# Patient Record
Sex: Female | Born: 1955 | Race: White | Hispanic: No | Smoking: Former smoker
Health system: Southern US, Community
[De-identification: ages and names within clinical notes are randomized; demographics above are authoritative.]

## PROBLEM LIST (undated history)

## (undated) DIAGNOSIS — E079 Disorder of thyroid, unspecified: Secondary | ICD-10-CM

## (undated) DIAGNOSIS — I509 Heart failure, unspecified: Secondary | ICD-10-CM

## (undated) DIAGNOSIS — F32A Depression, unspecified: Secondary | ICD-10-CM

## (undated) DIAGNOSIS — G8929 Other chronic pain: Secondary | ICD-10-CM

## (undated) DIAGNOSIS — F419 Anxiety disorder, unspecified: Secondary | ICD-10-CM

## (undated) DIAGNOSIS — Z9981 Dependence on supplemental oxygen: Secondary | ICD-10-CM

## (undated) DIAGNOSIS — J449 Chronic obstructive pulmonary disease, unspecified: Secondary | ICD-10-CM

## (undated) DIAGNOSIS — R053 Chronic cough: Secondary | ICD-10-CM

## (undated) DIAGNOSIS — F329 Major depressive disorder, single episode, unspecified: Secondary | ICD-10-CM

## (undated) DIAGNOSIS — E785 Hyperlipidemia, unspecified: Secondary | ICD-10-CM

## (undated) DIAGNOSIS — E119 Type 2 diabetes mellitus without complications: Secondary | ICD-10-CM

## (undated) DIAGNOSIS — R600 Localized edema: Secondary | ICD-10-CM

## (undated) DIAGNOSIS — R05 Cough: Secondary | ICD-10-CM

## (undated) DIAGNOSIS — K649 Unspecified hemorrhoids: Secondary | ICD-10-CM

## (undated) DIAGNOSIS — I10 Essential (primary) hypertension: Secondary | ICD-10-CM

## (undated) DIAGNOSIS — G629 Polyneuropathy, unspecified: Secondary | ICD-10-CM

## (undated) DIAGNOSIS — R609 Edema, unspecified: Secondary | ICD-10-CM

## (undated) HISTORY — PX: CHOLECYSTECTOMY: SHX55

---

## 2000-09-23 ENCOUNTER — Emergency Department (HOSPITAL_COMMUNITY): Admission: EM | Admit: 2000-09-23 | Discharge: 2000-09-23 | Payer: Self-pay | Admitting: *Deleted

## 2010-09-27 ENCOUNTER — Emergency Department (HOSPITAL_COMMUNITY)
Admission: EM | Admit: 2010-09-27 | Discharge: 2010-09-27 | Disposition: A | Discharge status: Home / Self Care | Payer: Medicaid Other | Attending: Emergency Medicine | Admitting: Emergency Medicine

## 2010-09-27 DIAGNOSIS — E7800 Pure hypercholesterolemia, unspecified: Secondary | ICD-10-CM | POA: Insufficient documentation

## 2010-09-27 DIAGNOSIS — R5381 Other malaise: Secondary | ICD-10-CM | POA: Insufficient documentation

## 2010-09-27 DIAGNOSIS — I10 Essential (primary) hypertension: Secondary | ICD-10-CM | POA: Insufficient documentation

## 2010-09-27 DIAGNOSIS — M79609 Pain in unspecified limb: Secondary | ICD-10-CM | POA: Insufficient documentation

## 2010-09-27 DIAGNOSIS — E039 Hypothyroidism, unspecified: Secondary | ICD-10-CM | POA: Insufficient documentation

## 2010-09-27 DIAGNOSIS — F411 Generalized anxiety disorder: Secondary | ICD-10-CM | POA: Insufficient documentation

## 2010-09-27 DIAGNOSIS — R509 Fever, unspecified: Secondary | ICD-10-CM | POA: Insufficient documentation

## 2010-09-27 DIAGNOSIS — A779 Spotted fever, unspecified: Secondary | ICD-10-CM | POA: Insufficient documentation

## 2010-09-27 DIAGNOSIS — Z79899 Other long term (current) drug therapy: Secondary | ICD-10-CM | POA: Insufficient documentation

## 2010-09-27 DIAGNOSIS — E119 Type 2 diabetes mellitus without complications: Secondary | ICD-10-CM | POA: Insufficient documentation

## 2013-01-03 ENCOUNTER — Encounter (HOSPITAL_COMMUNITY): Payer: Self-pay

## 2013-01-03 ENCOUNTER — Inpatient Hospital Stay (HOSPITAL_COMMUNITY)
Admission: EM | Admit: 2013-01-03 | Discharge: 2013-01-05 | DRG: 292 | Disposition: A | Discharge status: Home Health | Payer: Medicaid Other | Attending: Internal Medicine | Admitting: Internal Medicine

## 2013-01-03 ENCOUNTER — Emergency Department (HOSPITAL_COMMUNITY): Payer: Medicaid Other

## 2013-01-03 DIAGNOSIS — R600 Localized edema: Secondary | ICD-10-CM | POA: Diagnosis present

## 2013-01-03 DIAGNOSIS — I5031 Acute diastolic (congestive) heart failure: Principal | ICD-10-CM | POA: Diagnosis present

## 2013-01-03 DIAGNOSIS — F17201 Nicotine dependence, unspecified, in remission: Secondary | ICD-10-CM

## 2013-01-03 DIAGNOSIS — E785 Hyperlipidemia, unspecified: Secondary | ICD-10-CM | POA: Diagnosis present

## 2013-01-03 DIAGNOSIS — F341 Dysthymic disorder: Secondary | ICD-10-CM | POA: Diagnosis present

## 2013-01-03 DIAGNOSIS — R05 Cough: Secondary | ICD-10-CM | POA: Diagnosis present

## 2013-01-03 DIAGNOSIS — R0601 Orthopnea: Secondary | ICD-10-CM | POA: Diagnosis present

## 2013-01-03 DIAGNOSIS — K644 Residual hemorrhoidal skin tags: Secondary | ICD-10-CM | POA: Diagnosis present

## 2013-01-03 DIAGNOSIS — E669 Obesity, unspecified: Secondary | ICD-10-CM | POA: Diagnosis present

## 2013-01-03 DIAGNOSIS — J4489 Other specified chronic obstructive pulmonary disease: Secondary | ICD-10-CM | POA: Diagnosis present

## 2013-01-03 DIAGNOSIS — R7402 Elevation of levels of lactic acid dehydrogenase (LDH): Secondary | ICD-10-CM | POA: Diagnosis present

## 2013-01-03 DIAGNOSIS — K5900 Constipation, unspecified: Secondary | ICD-10-CM | POA: Diagnosis present

## 2013-01-03 DIAGNOSIS — IMO0001 Reserved for inherently not codable concepts without codable children: Secondary | ICD-10-CM | POA: Diagnosis present

## 2013-01-03 DIAGNOSIS — I509 Heart failure, unspecified: Secondary | ICD-10-CM | POA: Diagnosis present

## 2013-01-03 DIAGNOSIS — G589 Mononeuropathy, unspecified: Secondary | ICD-10-CM | POA: Diagnosis present

## 2013-01-03 DIAGNOSIS — G4733 Obstructive sleep apnea (adult) (pediatric): Secondary | ICD-10-CM | POA: Diagnosis present

## 2013-01-03 DIAGNOSIS — E8881 Metabolic syndrome: Secondary | ICD-10-CM | POA: Diagnosis present

## 2013-01-03 DIAGNOSIS — R053 Chronic cough: Secondary | ICD-10-CM | POA: Diagnosis present

## 2013-01-03 DIAGNOSIS — D649 Anemia, unspecified: Secondary | ICD-10-CM | POA: Diagnosis present

## 2013-01-03 DIAGNOSIS — Z794 Long term (current) use of insulin: Secondary | ICD-10-CM

## 2013-01-03 DIAGNOSIS — E662 Morbid (severe) obesity with alveolar hypoventilation: Secondary | ICD-10-CM | POA: Diagnosis present

## 2013-01-03 DIAGNOSIS — E039 Hypothyroidism, unspecified: Secondary | ICD-10-CM | POA: Diagnosis present

## 2013-01-03 DIAGNOSIS — R0600 Dyspnea, unspecified: Secondary | ICD-10-CM | POA: Diagnosis present

## 2013-01-03 DIAGNOSIS — J449 Chronic obstructive pulmonary disease, unspecified: Secondary | ICD-10-CM | POA: Diagnosis present

## 2013-01-03 DIAGNOSIS — R748 Abnormal levels of other serum enzymes: Secondary | ICD-10-CM | POA: Diagnosis present

## 2013-01-03 DIAGNOSIS — Z23 Encounter for immunization: Secondary | ICD-10-CM

## 2013-01-03 DIAGNOSIS — R7401 Elevation of levels of liver transaminase levels: Secondary | ICD-10-CM | POA: Diagnosis present

## 2013-01-03 DIAGNOSIS — Z87891 Personal history of nicotine dependence: Secondary | ICD-10-CM

## 2013-01-03 DIAGNOSIS — F418 Other specified anxiety disorders: Secondary | ICD-10-CM | POA: Diagnosis present

## 2013-01-03 DIAGNOSIS — Z6841 Body Mass Index (BMI) 40.0 and over, adult: Secondary | ICD-10-CM

## 2013-01-03 DIAGNOSIS — I87309 Chronic venous hypertension (idiopathic) without complications of unspecified lower extremity: Secondary | ICD-10-CM | POA: Diagnosis present

## 2013-01-03 HISTORY — DX: Other chronic pain: G89.29

## 2013-01-03 HISTORY — DX: Essential (primary) hypertension: I10

## 2013-01-03 HISTORY — DX: Polyneuropathy, unspecified: G62.9

## 2013-01-03 HISTORY — DX: Type 2 diabetes mellitus without complications: E11.9

## 2013-01-03 HISTORY — DX: Depression, unspecified: F32.A

## 2013-01-03 HISTORY — DX: Chronic obstructive pulmonary disease, unspecified: J44.9

## 2013-01-03 HISTORY — DX: Hyperlipidemia, unspecified: E78.5

## 2013-01-03 HISTORY — DX: Major depressive disorder, single episode, unspecified: F32.9

## 2013-01-03 HISTORY — DX: Anxiety disorder, unspecified: F41.9

## 2013-01-03 HISTORY — DX: Disorder of thyroid, unspecified: E07.9

## 2013-01-03 LAB — HEPATIC FUNCTION PANEL
AST: 36 U/L (ref 0–37)
Albumin: 3.2 g/dL — ABNORMAL LOW (ref 3.5–5.2)
Total Protein: 7 g/dL (ref 6.0–8.3)

## 2013-01-03 LAB — CBC WITH DIFFERENTIAL/PLATELET
Basophils Absolute: 0 10*3/uL (ref 0.0–0.1)
Eosinophils Relative: 3 % (ref 0–5)
HCT: 32.1 % — ABNORMAL LOW (ref 36.0–46.0)
Lymphocytes Relative: 13 % (ref 12–46)
MCV: 98.2 fL (ref 78.0–100.0)
Monocytes Relative: 4 % (ref 3–12)
Neutro Abs: 5.5 10*3/uL (ref 1.7–7.7)
Platelets: 116 10*3/uL — ABNORMAL LOW (ref 150–400)
RBC: 3.27 MIL/uL — ABNORMAL LOW (ref 3.87–5.11)
RDW: 15.8 % — ABNORMAL HIGH (ref 11.5–15.5)
WBC: 6.9 10*3/uL (ref 4.0–10.5)

## 2013-01-03 LAB — BASIC METABOLIC PANEL
CO2: 30 mEq/L (ref 19–32)
Chloride: 102 mEq/L (ref 96–112)
GFR calc Af Amer: >90 mL/min (ref >90)
Potassium: 3.6 mEq/L (ref 3.5–5.1)

## 2013-01-03 LAB — PRO B NATRIURETIC PEPTIDE: Pro B Natriuretic peptide (BNP): 306.3 pg/mL — ABNORMAL HIGH (ref 0–125)

## 2013-01-03 LAB — TROPONIN I: Troponin I: <0.3 ng/mL (ref <0.30)

## 2013-01-03 LAB — RETICULOCYTES
RBC.: 3.28 MIL/uL — ABNORMAL LOW (ref 3.87–5.11)
Retic Count, Absolute: 141 10*3/uL (ref 19.0–186.0)

## 2013-01-03 MED ORDER — ASPIRIN EC 81 MG PO TBEC
81.0000 mg | DELAYED_RELEASE_TABLET | Freq: Every day | ORAL | Status: DC
  Administered 2013-01-04 – 2013-01-05 (×2): 81 mg via ORAL
  Filled 2013-01-03 (×2): qty 1

## 2013-01-03 MED ORDER — INSULIN ASPART 100 UNIT/ML ~~LOC~~ SOLN
0.0000 [IU] | Freq: Three times a day (TID) | SUBCUTANEOUS | Status: DC
  Administered 2013-01-04: 3 [IU] via SUBCUTANEOUS

## 2013-01-03 MED ORDER — ALBUTEROL SULFATE (5 MG/ML) 0.5% IN NEBU
2.5000 mg | INHALATION_SOLUTION | RESPIRATORY_TRACT | Status: DC | PRN
  Administered 2013-01-04 (×2): 2.5 mg via RESPIRATORY_TRACT
  Filled 2013-01-03 (×2): qty 0.5

## 2013-01-03 MED ORDER — ENOXAPARIN SODIUM 40 MG/0.4ML ~~LOC~~ SOLN
40.0000 mg | SUBCUTANEOUS | Status: DC
  Administered 2013-01-04 – 2013-01-05 (×2): 40 mg via SUBCUTANEOUS
  Filled 2013-01-03 (×2): qty 0.4

## 2013-01-03 MED ORDER — GABAPENTIN 300 MG PO CAPS
300.0000 mg | ORAL_CAPSULE | Freq: Three times a day (TID) | ORAL | Status: DC
  Administered 2013-01-04 – 2013-01-05 (×5): 300 mg via ORAL
  Filled 2013-01-03 (×5): qty 1

## 2013-01-03 MED ORDER — LISINOPRIL 5 MG PO TABS
5.0000 mg | ORAL_TABLET | Freq: Every day | ORAL | Status: DC
  Administered 2013-01-04 – 2013-01-05 (×2): 5 mg via ORAL
  Filled 2013-01-03 (×2): qty 1

## 2013-01-03 MED ORDER — HYDROCODONE-ACETAMINOPHEN 5-325 MG PO TABS: 1.0000 | ORAL_TABLET | ORAL | Status: DC | PRN

## 2013-01-03 MED ORDER — FUROSEMIDE 10 MG/ML IJ SOLN
40.0000 mg | Freq: Two times a day (BID) | INTRAMUSCULAR | Status: DC
  Administered 2013-01-04 – 2013-01-05 (×3): 40 mg via INTRAVENOUS
  Filled 2013-01-03 (×4): qty 4

## 2013-01-03 MED ORDER — LEVOTHYROXINE SODIUM 100 MCG PO TABS
200.0000 ug | ORAL_TABLET | Freq: Every day | ORAL | Status: DC
  Administered 2013-01-04 – 2013-01-05 (×2): 200 ug via ORAL
  Filled 2013-01-03 (×2): qty 2

## 2013-01-03 MED ORDER — IPRATROPIUM BROMIDE 0.02 % IN SOLN
0.5000 mg | Freq: Once | RESPIRATORY_TRACT | Status: AC
  Administered 2013-01-03: 0.5 mg via RESPIRATORY_TRACT
  Filled 2013-01-03: qty 2.5

## 2013-01-03 MED ORDER — PREDNISONE 50 MG PO TABS
60.0000 mg | ORAL_TABLET | Freq: Once | ORAL | Status: AC
  Administered 2013-01-03: 60 mg via ORAL
  Filled 2013-01-03: qty 1

## 2013-01-03 MED ORDER — SODIUM CHLORIDE 0.9 % IJ SOLN: 3.0000 mL | INTRAMUSCULAR | Status: DC | PRN

## 2013-01-03 MED ORDER — FUROSEMIDE 10 MG/ML IJ SOLN
40.0000 mg | Freq: Once | INTRAMUSCULAR | Status: AC
  Administered 2013-01-03: 40 mg via INTRAVENOUS
  Filled 2013-01-03: qty 4

## 2013-01-03 MED ORDER — ONDANSETRON HCL 4 MG/2ML IJ SOLN: 4.0000 mg | Freq: Four times a day (QID) | INTRAMUSCULAR | Status: DC | PRN

## 2013-01-03 MED ORDER — SENNOSIDES-DOCUSATE SODIUM 8.6-50 MG PO TABS
2.0000 | ORAL_TABLET | Freq: Every day | ORAL | Status: DC
  Filled 2013-01-03 (×2): qty 2

## 2013-01-03 MED ORDER — IPRATROPIUM BROMIDE 0.02 % IN SOLN
0.5000 mg | RESPIRATORY_TRACT | Status: DC | PRN
  Administered 2013-01-04: 0.5 mg via RESPIRATORY_TRACT
  Filled 2013-01-03: qty 2.5

## 2013-01-03 MED ORDER — ACETAMINOPHEN 325 MG PO TABS: 650.0000 mg | ORAL_TABLET | ORAL | Status: DC | PRN

## 2013-01-03 MED ORDER — ALPRAZOLAM 0.25 MG PO TABS
0.2500 mg | ORAL_TABLET | Freq: Two times a day (BID) | ORAL | Status: DC | PRN
  Administered 2013-01-04: 0.25 mg via ORAL
  Filled 2013-01-03: qty 1

## 2013-01-03 MED ORDER — DULOXETINE HCL 20 MG PO CPEP
20.0000 mg | ORAL_CAPSULE | Freq: Every day | ORAL | Status: DC
  Administered 2013-01-04 – 2013-01-05 (×2): 20 mg via ORAL
  Filled 2013-01-03 (×3): qty 1

## 2013-01-03 MED ORDER — ALPRAZOLAM 0.5 MG PO TABS
0.5000 mg | ORAL_TABLET | Freq: Every day | ORAL | Status: DC
  Administered 2013-01-04 (×2): 0.5 mg via ORAL
  Filled 2013-01-03 (×2): qty 1

## 2013-01-03 MED ORDER — SODIUM CHLORIDE 0.9 % IJ SOLN
3.0000 mL | Freq: Two times a day (BID) | INTRAMUSCULAR | Status: DC
  Administered 2013-01-04 – 2013-01-05 (×4): 3 mL via INTRAVENOUS

## 2013-01-03 MED ORDER — ALBUTEROL SULFATE (5 MG/ML) 0.5% IN NEBU
5.0000 mg | INHALATION_SOLUTION | Freq: Once | RESPIRATORY_TRACT | Status: AC
  Administered 2013-01-03: 5 mg via RESPIRATORY_TRACT
  Filled 2013-01-03: qty 1

## 2013-01-03 MED ORDER — INSULIN GLARGINE 100 UNIT/ML ~~LOC~~ SOLN
10.0000 [IU] | Freq: Every day | SUBCUTANEOUS | Status: DC
  Administered 2013-01-04 (×2): 10 [IU] via SUBCUTANEOUS
  Filled 2013-01-03 (×3): qty 0.1

## 2013-01-03 MED ORDER — SODIUM CHLORIDE 0.9 % IV SOLN: 250.0000 mL | INTRAVENOUS | Status: DC | PRN

## 2013-01-03 MED ORDER — POTASSIUM CHLORIDE CRYS ER 20 MEQ PO TBCR
20.0000 meq | EXTENDED_RELEASE_TABLET | Freq: Every day | ORAL | Status: DC
  Administered 2013-01-04 – 2013-01-05 (×3): 20 meq via ORAL
  Filled 2013-01-03 (×3): qty 1

## 2013-01-03 NOTE — ED Provider Notes (Signed)
History    CSN: [id number] Arrival date & time 01/03/13  1353  First MD Initiated Contact with Patient 01/03/13 1408     Chief Complaint  Patient presents with  . Shortness of Breath  . Cough    HPI Pt was seen at 1415.   Per pt, c/o gradual onset and worsening of persistent cough, wheezing and SOB for the past 2 weeks, worse over the past several days.  Describes her symptoms as "someone told me it was COPD." States she ran out of her MDI, but has been using her home nebs with transient relief. Has been associated with increasing pedal edema. States she has been unable to walk her usual distances due to increasing SOB. Denies hx CHF. Denies CP/palpitations, no back pain, no abd pain, no N/V/D, no fevers, no rash.    No PMD Past Medical History  Diagnosis Date  . COPD (chronic obstructive pulmonary disease)   . Diabetes mellitus without complication   . Hypertension   . Thyroid disease   . Hyperlipemia   . Neuropathy   . Depression   . Chronic pain   . Anxiety    History reviewed. No pertinent past surgical history.  History  Substance Use Topics  . Smoking status: Former Games developer  . Smokeless tobacco: Not on file  . Alcohol Use: No    Review of Systems ROS: Statement: All systems negative except as marked or noted in the HPI; Constitutional: Negative for fever and chills. ; ; Eyes: Negative for eye pain, redness and discharge. ; ; ENMT: Negative for ear pain, hoarseness, nasal congestion, sinus pressure and sore throat. ; ; Cardiovascular: Negative for chest pain, palpitations, diaphoresis, +dyspnea and peripheral edema. ; ; Respiratory: +cough, wheezing. Negative for stridor. ; ; Gastrointestinal: Negative for nausea, vomiting, diarrhea, abdominal pain, blood in stool, hematemesis, jaundice and rectal bleeding. . ; ; Genitourinary: Negative for dysuria, flank pain and hematuria. ; ; Musculoskeletal: Negative for back pain and neck pain. Negative for swelling and trauma.; ;  Skin: Negative for pruritus, rash, abrasions, blisters, bruising and skin lesion.; ; Neuro: Negative for headache, lightheadedness and neck stiffness. Negative for weakness, altered level of consciousness , altered mental status, extremity weakness, paresthesias, involuntary movement, seizure and syncope.       Allergies  Codeine; Keflex; and Penicillins  Home Medications   Current Outpatient Rx  Name  Route  Sig  Dispense  Refill  . gabapentin (NEURONTIN) 300 MG capsule   Oral   Take 300 mg by mouth 3 (three) times daily.         . insulin glargine (LANTUS) 100 UNIT/ML injection   Subcutaneous   Inject 10 Units into the skin at bedtime.         . insulin lispro (HUMALOG) 100 UNIT/ML injection   Subcutaneous   Inject 10 Units into the skin 3 (three) times daily before meals.         Marland Kitchen levothyroxine (SYNTHROID, LEVOTHROID) 200 MCG tablet   Oral   Take 200 mcg by mouth daily before breakfast.         . PARoxetine (PAXIL) 20 MG tablet   Oral   Take 20 mg by mouth every morning.          BP 126/72  Pulse 104  Temp(Src) 98.2 F (36.8 C) (Oral)  Resp 24  SpO2 99% Physical Exam 1420: Physical examination:  Nursing notes reviewed; Vital signs and O2 SAT reviewed;  Constitutional: Well developed, Well nourished,  Well hydrated, In no acute distress; Head:  Normocephalic, atraumatic; Eyes: EOMI, PERRL, No scleral icterus; ENMT: TM's clear bilat. Mouth and pharynx normal, Mucous membranes moist; Neck: Supple, Full range of motion, No lymphadenopathy; Cardiovascular: Regular rate and rhythm, No gallop; Respiratory: Breath sounds coarse & equal bilaterally, diminished at bases bilat with scattered wheezing. Speaking full sentences, Normal respiratory effort/excursion; Chest: Nontender, Movement normal; Abdomen: Soft, Morbidly obese. Nontender, Normal bowel sounds; Genitourinary: No CVA tenderness; Extremities: Pulses normal, No tenderness, +2 pedal edema bilat. No calf  asymmetry.; Neuro: AA&Ox3, Major CN grossly intact.  Speech clear. No gross focal motor or sensory deficits in extremities.; Skin: Color normal, Warm, Dry.   ED Course  Procedures    MDM  MDM Reviewed: previous chart, nursing note and vitals Interpretation: labs, ECG and x-ray    Date: 01/03/2013  Rate: 104  Rhythm: sinus tachycardia, artifact  QRS Axis: normal  Intervals: normal  ST/T Wave abnormalities: nonspecific ST/T changes  Conduction Disutrbances:none  Narrative Interpretation:   Old EKG Reviewed: none available.   Results for orders placed during the hospital encounter of 01/03/13  CBC WITH DIFFERENTIAL      Result Value Range   WBC 6.9  4.0 - 10.5 K/uL   RBC 3.27 (*) 3.87 - 5.11 MIL/uL   Hemoglobin 10.5 (*) 12.0 - 15.0 g/dL   HCT 16.1 (*) 09.6 - 04.5 %   MCV 98.2  78.0 - 100.0 fL   MCH 32.1  26.0 - 34.0 pg   MCHC 32.7  30.0 - 36.0 g/dL   RDW 40.9 (*) 81.1 - 91.4 %   Platelets 116 (*) 150 - 400 K/uL   Neutrophils Relative % 80 (*) 43 - 77 %   Lymphocytes Relative 13  12 - 46 %   Monocytes Relative 4  3 - 12 %   Eosinophils Relative 3  0 - 5 %   Basophils Relative 0  0 - 1 %   Neutro Abs 5.5  1.7 - 7.7 K/uL   Lymphs Abs 0.9  0.7 - 4.0 K/uL   Monocytes Absolute 0.3  0.1 - 1.0 K/uL   Eosinophils Absolute 0.2  0.0 - 0.7 K/uL   Basophils Absolute 0.0  0.0 - 0.1 K/uL   Smear Review PLATELET COUNT CONFIRMED BY SMEAR    BASIC METABOLIC PANEL      Result Value Range   Sodium 139  135 - 145 mEq/L   Potassium 3.6  3.5 - 5.1 mEq/L   Chloride 102  96 - 112 mEq/L   CO2 30  19 - 32 mEq/L   Glucose, Bld 152 (*) 70 - 99 mg/dL   BUN 11  6 - 23 mg/dL   Creatinine, Ser 7.82  0.50 - 1.10 mg/dL   Calcium 8.9  8.4 - 95.6 mg/dL   GFR calc non Af Amer 80 (*) >90 mL/min   GFR calc Af Amer >90  >90 mL/min  TROPONIN I      Result Value Range   Troponin I <0.30  <0.30 ng/mL  PRO B NATRIURETIC PEPTIDE      Result Value Range   Pro B Natriuretic peptide (BNP) 306.3 (*) 0 -  125 pg/mL   Dg Chest 2 View 01/03/2013   *RADIOLOGY REPORT*  Clinical Data: Shortness of breath and cough  CHEST - 2 VIEW  Comparison: Report from chest radiograph 10/01/2009.  Images not available to  Findings: Mild cardiomegaly.  There is a central and peripheral pulmonary vascular congestion.  There  is diffuse interstitial prominence throughout both lungs with thickening of the minor fissure. Small airspace opacities versus atelectasis at the lung bases medially.  There are small bilateral pleural effusions.  The lateral view is slightly limited by respiratory motion.  No acute bony abnormality is identified.  IMPRESSION: Congestive heart failure pattern with small bilateral pleural effusions.   Original Report Authenticated By: Britta Mccreedy, M.D.    1945: Neb and prednisone given for wheezing on arrival. Pt ambulated with Sats 97% R/A afterwards. Stated she "felt better" but after returning to her exam room c/o increasing SOB and coughing. Lungs coarse bilat, diminished at bases bilat. Sats 99% R/A. CXR with CHF and pleural effusions, no hx of same.  H/H decreased; no old to compare. Will dose IV lasix. Dx and testing d/w pt and family.  Questions answered.  Verb understanding, agreeable to observation admit. T/C to [facility]. Rito Ehrlich, case discussed, including:  HPI, pertinent PM/SHx, VS/PE, dx testing, ED course and treatment:  Agreeable to observation admit, requests he would like to come to ED for eval.      Laray Anger, DO 01/05/13 0037

## 2013-01-03 NOTE — ED Notes (Signed)
Patient's O2 sat maintained at 97-98% while walking.  Patient stated she did not feel SOB while we were walking.  She stated she had not been able to walk more than 15-20 steps without becoming "short winded" but now she was able to walk without any respiratory distress.  Nurse and MD notified

## 2013-01-03 NOTE — ED Notes (Signed)
Patient states she feels she is breathing better now.

## 2013-01-03 NOTE — ED Notes (Signed)
Attempted to clean patient of bm she had done before i  Took over her assignment  But she refused and stated she would shower upon admission to floor

## 2013-01-03 NOTE — ED Notes (Signed)
Pt reports sob for the past 2 weeks.  Pt reports cough/congestion as well.  Pt reports hx of COPD.

## 2013-01-04 ENCOUNTER — Inpatient Hospital Stay (HOSPITAL_COMMUNITY): Payer: Medicaid Other

## 2013-01-04 DIAGNOSIS — IMO0001 Reserved for inherently not codable concepts without codable children: Secondary | ICD-10-CM

## 2013-01-04 DIAGNOSIS — R740 Nonspecific elevation of levels of transaminase and lactic acid dehydrogenase [LDH]: Secondary | ICD-10-CM

## 2013-01-04 DIAGNOSIS — I379 Nonrheumatic pulmonary valve disorder, unspecified: Secondary | ICD-10-CM

## 2013-01-04 DIAGNOSIS — R0609 Other forms of dyspnea: Secondary | ICD-10-CM

## 2013-01-04 DIAGNOSIS — E8881 Metabolic syndrome: Secondary | ICD-10-CM

## 2013-01-04 DIAGNOSIS — I509 Heart failure, unspecified: Secondary | ICD-10-CM

## 2013-01-04 DIAGNOSIS — R05 Cough: Secondary | ICD-10-CM

## 2013-01-04 DIAGNOSIS — E669 Obesity, unspecified: Secondary | ICD-10-CM

## 2013-01-04 DIAGNOSIS — F341 Dysthymic disorder: Secondary | ICD-10-CM

## 2013-01-04 DIAGNOSIS — D649 Anemia, unspecified: Secondary | ICD-10-CM

## 2013-01-04 DIAGNOSIS — R7401 Elevation of levels of liver transaminase levels: Secondary | ICD-10-CM | POA: Diagnosis present

## 2013-01-04 LAB — URINE MICROSCOPIC-ADD ON

## 2013-01-04 LAB — LIPID PANEL
HDL: 56 mg/dL (ref >39)
LDL Cholesterol: 33 mg/dL (ref 0–99)
Total CHOL/HDL Ratio: 1.8 RATIO
Triglycerides: 58 mg/dL (ref <150)
VLDL: 12 mg/dL (ref 0–40)

## 2013-01-04 LAB — GLUCOSE, CAPILLARY
Glucose-Capillary: 102 mg/dL — ABNORMAL HIGH (ref 70–99)
Glucose-Capillary: 121 mg/dL — ABNORMAL HIGH (ref 70–99)
Glucose-Capillary: 125 mg/dL — ABNORMAL HIGH (ref 70–99)
Glucose-Capillary: 86 mg/dL (ref 70–99)
Glucose-Capillary: 97 mg/dL (ref 70–99)

## 2013-01-04 LAB — CBC
HCT: 30 % — ABNORMAL LOW (ref 36.0–46.0)
Hemoglobin: 10 g/dL — ABNORMAL LOW (ref 12.0–15.0)
MCH: 32.6 pg (ref 26.0–34.0)
MCHC: 33.3 g/dL (ref 30.0–36.0)
MCV: 97.7 fL (ref 78.0–100.0)
Platelets: 123 K/uL — ABNORMAL LOW (ref 150–400)
RBC: 3.07 MIL/uL — ABNORMAL LOW (ref 3.87–5.11)
RDW: 15.6 % — ABNORMAL HIGH (ref 11.5–15.5)
WBC: 8.5 K/uL (ref 4.0–10.5)

## 2013-01-04 LAB — URINALYSIS, ROUTINE W REFLEX MICROSCOPIC
Bilirubin Urine: NEGATIVE
Glucose, UA: NEGATIVE mg/dL
Ketones, ur: NEGATIVE mg/dL
Nitrite: NEGATIVE
Protein, ur: NEGATIVE mg/dL
Specific Gravity, Urine: 1.025 (ref 1.005–1.030)
Urobilinogen, UA: 0.2 mg/dL (ref 0.0–1.0)
pH: 5.5 (ref 5.0–8.0)

## 2013-01-04 LAB — TROPONIN I
Troponin I: <0.3 ng/mL
Troponin I: <0.3 ng/mL

## 2013-01-04 LAB — IRON AND TIBC
Iron: 50 ug/dL (ref 42–135)
Saturation Ratios: 18 % — ABNORMAL LOW (ref 20–55)
TIBC: 276 ug/dL (ref 250–470)
UIBC: 226 ug/dL (ref 125–400)

## 2013-01-04 LAB — BASIC METABOLIC PANEL WITH GFR
BUN: 13 mg/dL (ref 6–23)
CO2: 31 meq/L (ref 19–32)
Calcium: 8.6 mg/dL (ref 8.4–10.5)
Chloride: 105 meq/L (ref 96–112)
Creatinine, Ser: 0.91 mg/dL (ref 0.50–1.10)
GFR calc Af Amer: 81 mL/min — ABNORMAL LOW
GFR calc non Af Amer: 70 mL/min — ABNORMAL LOW
Glucose, Bld: 135 mg/dL — ABNORMAL HIGH (ref 70–99)
Potassium: 3.7 meq/L (ref 3.5–5.1)
Sodium: 141 meq/L (ref 135–145)

## 2013-01-04 LAB — TSH: TSH: 1.241 u[IU]/mL (ref 0.350–4.500)

## 2013-01-04 LAB — FOLATE: Folate: 9.1 ng/mL

## 2013-01-04 LAB — VITAMIN B12: Vitamin B-12: 664 pg/mL (ref 211–911)

## 2013-01-04 MED ORDER — PNEUMOCOCCAL VAC POLYVALENT 25 MCG/0.5ML IJ INJ
0.5000 mL | INJECTION | INTRAMUSCULAR | Status: AC
  Administered 2013-01-05: 0.5 mL via INTRAMUSCULAR
  Filled 2013-01-04: qty 0.5

## 2013-01-04 MED ORDER — INSULIN GLARGINE 100 UNIT/ML ~~LOC~~ SOLN
SUBCUTANEOUS | Status: AC
  Filled 2013-01-04: qty 10

## 2013-01-04 NOTE — Progress Notes (Signed)
[facility] PROGRESS NOTE  [name] Green ZOX:[id number] DOB: 1955/10/15 DOA: 01/03/2013 PCP: No PCP Per Patient  Assessment/Plan: 1. Acute CHF (congestive heart failure): Underlying etiology unknown, this is her first presentation with signs and symptoms of congestive heart failure which included an elevated BNP and evidence of pulmonary edema on her chest x-ray. Clinically improved this am.  Will continue Lasix IV 40 mg every 12 with potassium repletion.  Await 2-D echo results. Repeat EKG this am yields occasional PVC's not present yesterday. Cardiac enzymes negative to date. TSH in process. Continue ACE inhibitor. No beta blocker in setting of acute decompensation and mild hypotension. Continue  Aspirin 81 mg  2. Obesity, unspecified w/ Metabolic syndrome: BMI 88.9. Provided education on obesity and nutritional needs   3. Type II or unspecified type diabetes mellitus without mention of complication, uncontrolled: Fair control.  A1c pending. Continue SSI   4. Other and unspecified hyperlipidemia: Fasting Lipid Panel within the limits of normal.    5. Dyspnea: multifactorial-probable OHS/OSA, also has anemia. Improved this am.   6. Tobacco abuse, in remission   7. Lower extremity edema: Chronic problem related to obesity and lymphedema as well as venous hypertension, additionally may be from heart failure. Decreased LE edema this am. Will continue lasix   8. Anemia: Stool guaiac and anemia panel pending, Unsure of baseline. Likely multifactorial. No s/sx active bleeding. Will monitor   9. Unspecified constipation: Scheduled senna and docusate   10. Hemorrhoids, external: Potential source of GI blood loss.   11. Chronic cough: Unclear etiology may be related to heart failure. Improved this am. Nebs prn  12. Unspecified hypothyroidism:  TSH in process  13. Depression with anxiety: Started on Cymbalta and alprazolam scheduled at bedtime and twice daily when necessary for panic and  anxiety. Stable at baseline  14. Elevated alk phos and bilirubin. Etiology uncertain. Will monitor.    Code Status: full Family Communication: pt at bedside Disposition Plan: home when ready hopefully 24-48 hours   Consultants:  none  Procedures:  none  Antibiotics:  none  HPI/Subjective: Awake alert. Reports "feeling a lot better and the swelling in my legs has gone down".  Objective: Filed Vitals:   01/04/13 0443 01/04/13 0627 01/04/13 0736 01/04/13 0802  BP:  132/84    Pulse:  98  100  Temp:  97.9 F (36.6 C)    TempSrc:  Oral    Resp:  24  18  Height:  5\' 4"  (1.626 m)    Weight:  516 lb 11.2 oz (234.374 kg)    SpO2: 93% 94% 97% 98%   No intake or output data in the 24 hours ending 01/04/13 0846 Filed Weights   01/04/13 0627  Weight: 516 lb 11.2 oz (234.374 kg)    Exam:   General:  Morbidly obese, NAD  Cardiovascular: tachycardic but regular No MGR 1+LE edema  Respiratory: normal effort BS distant with fine crackles bilateral based up to lower thoracic. No wheeze or rhonchi  Abdomen: soft morbidly obese +BS non-tender to palpation  Musculoskeletal: no clubbing no cyanosis   Data Reviewed: Basic Metabolic Panel:  Recent Labs Lab 01/03/13 1727 01/04/13 0544  NA 139 141  K 3.6 3.7  CL 102 105  CO2 30 31  GLUCOSE 152* 135*  BUN 11 13  CREATININE 0.81 0.91  CALCIUM 8.9 8.6   Liver Function Tests:  Recent Labs Lab 01/03/13 2334  AST 36  ALT 15  ALKPHOS 132*  BILITOT 1.9*  PROT  7.0  ALBUMIN 3.2*   No results found for this basename: LIPASE, AMYLASE,  in the last 168 hours No results found for this basename: AMMONIA,  in the last 168 hours CBC:  Recent Labs Lab 01/03/13 1727 01/04/13 0544  WBC 6.9 8.5  NEUTROABS 5.5  --   HGB 10.5* 10.0*  HCT 32.1* 30.0*  MCV 98.2 97.7  PLT 116* 123*   Cardiac Enzymes:  Recent Labs Lab 01/03/13 1727 01/03/13 2350 01/04/13 0544  TROPONINI <0.30 <0.30 <0.30   BNP (last 3  results)  Recent Labs  01/03/13 1727  PROBNP 306.3*   CBG:  Recent Labs Lab 01/04/13 0011 01/04/13 0823  GLUCAP 125* 121*    No results found for this or any previous visit (from the past 240 hour(s)).   Studies: Dg Chest 2 View  01/03/2013   *RADIOLOGY REPORT*  Clinical Data: Shortness of breath and cough  CHEST - 2 VIEW  Comparison: Report from chest radiograph 10/01/2009.  Images not available to  Findings: Mild cardiomegaly.  There is a central and peripheral pulmonary vascular congestion.  There is diffuse interstitial prominence throughout both lungs with thickening of the minor fissure. Small airspace opacities versus atelectasis at the lung bases medially.  There are small bilateral pleural effusions.  The lateral view is slightly limited by respiratory motion.  No acute bony abnormality is identified.  IMPRESSION: Congestive heart failure pattern with small bilateral pleural effusions.   Original Report Authenticated By: Britta Mccreedy, M.D.    Scheduled Meds: . ALPRAZolam  0.5 mg Oral QHS  . aspirin EC  81 mg Oral Daily  . DULoxetine  20 mg Oral Daily  . enoxaparin (LOVENOX) injection  40 mg Subcutaneous Q24H  . furosemide  40 mg Intravenous BID  . gabapentin  300 mg Oral TID  . insulin aspart  0-20 Units Subcutaneous TID WC  . insulin glargine  10 Units Subcutaneous QHS  . levothyroxine  200 mcg Oral QAC breakfast  . lisinopril  5 mg Oral Daily  . [START ON 01/05/2013] pneumococcal 23 valent vaccine  0.5 mL Intramuscular Tomorrow-1000  . potassium chloride  20 mEq Oral Daily  . senna-docusate  2 tablet Oral QHS  . sodium chloride  3 mL Intravenous Q12H   Continuous Infusions:   Principal Problem:   Acute CHF (congestive heart failure) Active Problems:   Obesity, unspecified   Metabolic syndrome   Type II or unspecified type diabetes mellitus without mention of complication, uncontrolled   Other and unspecified hyperlipidemia   Dyspnea   Tobacco abuse, in  remission   Lower extremity edema   Orthopnea   Anemia   Unspecified constipation   Hemorrhoids, external   Chronic cough   Unspecified hypothyroidism   Depression with anxiety    Time spent: 30 minutes    [facility] M  [facility] Pager [phone]. If 7PM-7AM, please contact night-coverage at [email or web address], password [facility] 01/04/2013, 8:46 AM  LOS: 1 day  Attending: Patient seen and examined.Agree with above.Repeat CXR,await ECHO.

## 2013-01-04 NOTE — Progress Notes (Signed)
Pt refused foley catheter. [name] Green made aware.  Pt able to ambulate to BR with assistance.  Okay to leave out at this time per [name].

## 2013-01-04 NOTE — Progress Notes (Signed)
*  PRELIMINARY RESULTS* Echocardiogram 2D Echocardiogram has been performed.  [address] 01/04/2013, 11:53 AM

## 2013-01-04 NOTE — Care Management Note (Signed)
    Page 1 of 2   01/05/2013     3:14:13 PM   CARE MANAGEMENT NOTE 01/05/2013  Patient:  [name] Green, [name] Green   Account Number:  [id number]  Date Initiated:  01/04/2013  Documentation initiated by:  Sharrie Rothman  Subjective/Objective Assessment:   Pt admitted from home with CHF. Pt lives alone but has a grandson and granddaughter who stay with her at night. Pt does not have PCp. No DME or HH in the home at present. Pt is requesting equipment and HH at dischrge.     Action/Plan:   CM will arrange PCp with [facility]. Will evaluate need for home O2 prior to discharge. Will arrange HH and DMe as well.   Anticipated DC Date:  01/05/2013   Anticipated DC Plan:  HOME W HOME HEALTH SERVICES      DC Planning Services  CM consult      Choice offered to / List presented to:     DME arranged  [facility] B[facility]      DME agency  [facility]     [facility] arranged  HH-1 RN  HH-10 DISEASE MANAGEMENT      Status of service:  Completed, signed off Medicare Important Message given?   (If response is "NO", the following Medicare IM given date fields will be blank) Date Medicare IM given:   Date Additional Medicare IM given:    Discharge Disposition:  HOME W HOME HEALTH SERVICES  Per UR Regulation:    If discussed at Long Length of Stay Meetings, dates discussed:    Comments:  01/04/13 1410 Arlyss Queen, RN BSN CM

## 2013-01-04 NOTE — Progress Notes (Signed)
UR chart review completed.  

## 2013-01-04 NOTE — H&P (Signed)
[facility] History and Physical  [name] Green ZOX:[id number] DOB: [birth year] DOA: 01/03/2013  Referring physician: EDP McMannus PCP: No PCP Per Patient  Specialists: none  Chief Complaint: Dyspnea  HPI: [name] Green is a 57 y.o. female with metabolic syndrome, type 2 diabetes on insulin, hypertension and hypothyroidism who presented to the emergency department complaining of a two-week history of worsening shortness of breath. She has sporadic encounters with various physicians in the primary care setting but has never established care with a consistent  Provider. The patient states that she has had shortness of breath and exertion for many months but in the last 2 weeks it has become hard for her to walk just a few feet without significant difficulty. She is unable to lie flat and has been sleeping in a recliner for several days. She has worsening lower extremity edema. She denies any chest pain. No fevers chills or night sweats. She complains of a chronic cough which is productive of copious clear mucus worse in the morning. Denies any hematochezia but does report constipation with bleeding hemorrhoids. She has chronic lower extremity swelling and pain, no asymmetry. She reports issues with depression and anxiety and has a very sedentary lifestyle. She denies any known heart problems. She has a history of smoking but quit 5 years ago.   Review of Systems: Review of Systems  Constitutional: Positive for malaise/fatigue and diaphoresis. Negative for fever, chills and weight loss.  HENT: Positive for congestion.   Eyes: Negative for blurred vision.  Respiratory: Positive for cough, sputum production, shortness of breath and wheezing. Negative for hemoptysis.   Cardiovascular: Positive for orthopnea, leg swelling and PND. Negative for chest pain, palpitations and claudication.  Gastrointestinal: Positive for heartburn, nausea, constipation and blood in stool. Negative for vomiting,  abdominal pain, diarrhea and melena.  Genitourinary: Negative for dysuria, urgency and frequency.  Musculoskeletal: Positive for myalgias and joint pain.  Skin: Negative for itching and rash.  Neurological: Positive for dizziness, weakness and headaches. Negative for focal weakness.  Endo/Heme/Allergies: Does not bruise/bleed easily.  Psychiatric/Behavioral: Positive for depression. Negative for suicidal ideas, hallucinations, memory loss and substance abuse. The patient is nervous/anxious and has insomnia.   All other systems reviewed and are negative.     Past Medical History  Diagnosis Date  . COPD (chronic obstructive pulmonary disease)   . Diabetes mellitus without complication   . Hypertension   . Thyroid disease   . Hyperlipemia   . Neuropathy   . Depression   . Chronic pain   . Anxiety    History reviewed. No pertinent past surgical history. Social History:  reports that she has quit smoking. She does not have any smokeless tobacco history on file. She reports that she does not drink alcohol or use illicit drugs. Lives at home alone, she has sisters who help with her care. Mostly independent in her ADLs.  Allergies  Allergen Reactions  . Codeine Nausea And Vomiting  . Keflex (Cephalexin) Hives and Nausea And Vomiting  . Penicillins Nausea And Vomiting    Family history positive for lung cancer in her brother, hypertension in her mother and father, her mother is deceased of nonalcoholic cirrhosis liver failure. No known history of heart disease.  Prior to Admission medications   Medication Sig Start Date End Date Taking? Authorizing Provider  gabapentin (NEURONTIN) 300 MG capsule Take 300 mg by mouth 3 (three) times daily.   Yes Historical Provider, MD  insulin glargine (LANTUS) 100 UNIT/ML injection Inject  10 Units into the skin at bedtime.   Yes Historical Provider, MD  insulin lispro (HUMALOG) 100 UNIT/ML injection Inject 10 Units into the skin 3 (three) times daily  before meals.   Yes Historical Provider, MD  levothyroxine (SYNTHROID, LEVOTHROID) 200 MCG tablet Take 200 mcg by mouth daily before breakfast.   Yes Historical Provider, MD  PARoxetine (PAXIL) 20 MG tablet Take 20 mg by mouth every morning.   Yes Historical Provider, MD   Physical Exam: Filed Vitals:   01/03/13 1844 01/03/13 2157 01/03/13 2250 01/03/13 2334  BP: 126/72 124/66 110/57   Pulse: 104 108 95   Temp:      TempSrc:      Resp: 24 28 22    SpO2: 99% 96% 95% 95%     General:  Severely obese woman appearing older than stated age, guarded but cooperative with exam, no acute distress  Eyes: Normal  ENT: Dry poor dentition  Neck: Supple no adenopathy, positive for JVD  Cardiovascular: Tachycardic but regular no murmurs rubs or gallops  Respiratory: Crackles in bilateral bases good air flow  Abdomen: Obese, large pannus, no pain on palpation  Skin: No lesions, 1-2+ pitting edema in the lower extremities  Musculoskeletal: Moves all 4 extremities equally  Psychiatric: Anxious, depressed appearing  Neurologic: Nonfocal  Labs on Admission:  Basic Metabolic Panel:  Recent Labs Lab 01/03/13 1727  NA 139  K 3.6  CL 102  CO2 30  GLUCOSE 152*  BUN 11  CREATININE 0.81  CALCIUM 8.9   Liver Function Tests:  Recent Labs Lab 01/03/13 2334  AST 36  ALT 15  ALKPHOS 132*  BILITOT 1.9*  PROT 7.0  ALBUMIN 3.2*   CBC:  Recent Labs Lab 01/03/13 1727  WBC 6.9  NEUTROABS 5.5  HGB 10.5*  HCT 32.1*  MCV 98.2  PLT 116*   Cardiac Enzymes:  Recent Labs Lab 01/03/13 1727 01/03/13 2350  TROPONINI <0.30 <0.30    BNP (last 3 results)  Recent Labs  01/03/13 1727  PROBNP 306.3*   CBG:  Recent Labs Lab 01/04/13 0011  GLUCAP 125*    Radiological Exams on Admission: Dg Chest 2 View  01/03/2013   *RADIOLOGY REPORT*  Clinical Data: Shortness of breath and cough  CHEST - 2 VIEW  Comparison: Report from chest radiograph 10/01/2009.  Images not available  to  Findings: Mild cardiomegaly.  There is a central and peripheral pulmonary vascular congestion.  There is diffuse interstitial prominence throughout both lungs with thickening of the minor fissure. Small airspace opacities versus atelectasis at the lung bases medially.  There are small bilateral pleural effusions.  The lateral view is slightly limited by respiratory motion.  No acute bony abnormality is identified.  IMPRESSION: Congestive heart failure pattern with small bilateral pleural effusions.   Original Report Authenticated By: Britta Mccreedy, M.D.    EKG: Independently reviewed. Low voltage with sinus tachycardia  Assessment/Plan Principal Problem: 1. Acute CHF (congestive heart failure): Underlying etiology unknown, this is her first presentation with signs and symptoms of congestive heart failure which included an elevated BNP and evidence of pulmonary edema on her chest x-ray.  Lasix IV 40 mg every 6 with potassium repletion  Repeat chest x-ray in a.m.  Repeat ;abs in a.m.  2-D echo  12-lead EKG in a.m. and cycled troponins monitor on telemetry  TSH  Started on ACE inhibitor, did not start beta blocker in setting of acute decompensation and mild hypotension  Aspirin 81 mg  2. Obesity,  unspecified w/  Metabolic syndrome: Provided education on obesity and nutritional needs 3. Type II or unspecified type diabetes mellitus without mention of complication, uncontrolled: We'll check A1c and start on adult glycemic protocol   4. Other and unspecified hyperlipidemia: Fasting Lipid Panel, not currently on a statin  5. Dyspnea: multifactorial-probable OHS/OSA, also has anemia  6. Tobacco abuse, in remission  7. Lower extremity edema: Chronic problem related to obesity and lymphedema as well as venous hypertension, additionally may be from heart failure 8. Anemia: Stool guaiac and anemia panel ordered, CBC in AM- her anemia may also be significantly contributing to her shortness of  breath 9. Unspecified constipation: Scheduled senna and docusate 10. Hemorrhoids, external: Potential source of GI blood loss 11. Chronic cough: Unclear etiology may be related to heart failure  12. Unspecified hypothyroidism: Status unknown- check a TSH 13. Depression with anxiety: Started on Cymbalta and alprazolam scheduled at bedtime and twice daily when necessary for panic and anxiety.   Code Status: Full code Family Communication: Discussed plan of care in detail with patient and her family present in the room Disposition Plan: Discharge home in medically stable, anticipate one to 2 days Time spent: 70 minutes  [facility] Pager [phone]  If 7PM-7AM, please contact night-coverage [email or web address] Password [facility] 01/04/2013, 12:50 AM

## 2013-01-05 LAB — BASIC METABOLIC PANEL
Calcium: 8.4 mg/dL (ref 8.4–10.5)
GFR calc non Af Amer: 59 mL/min — ABNORMAL LOW (ref >90)
Potassium: 3.3 mEq/L — ABNORMAL LOW (ref 3.5–5.1)
Sodium: 143 mEq/L (ref 135–145)

## 2013-01-05 LAB — CBC
Platelets: 112 10*3/uL — ABNORMAL LOW (ref 150–400)
RBC: 2.92 MIL/uL — ABNORMAL LOW (ref 3.87–5.11)
WBC: 7.1 10*3/uL (ref 4.0–10.5)

## 2013-01-05 LAB — GLUCOSE, CAPILLARY: Glucose-Capillary: 87 mg/dL (ref 70–99)

## 2013-01-05 MED ORDER — ENOXAPARIN SODIUM 120 MG/0.8ML ~~LOC~~ SOLN
120.0000 mg | SUBCUTANEOUS | Status: DC
  Filled 2013-01-05: qty 0.8

## 2013-01-05 MED ORDER — ALPRAZOLAM 0.25 MG PO TABS: 0.2500 mg | ORAL_TABLET | Freq: Two times a day (BID) | ORAL | Status: DC | PRN

## 2013-01-05 MED ORDER — FUROSEMIDE 40 MG PO TABS: 40.0000 mg | ORAL_TABLET | Freq: Every day | ORAL | Status: DC

## 2013-01-05 MED ORDER — DULOXETINE HCL 20 MG PO CPEP: 20.0000 mg | ORAL_CAPSULE | Freq: Every day | ORAL | Status: AC

## 2013-01-05 MED ORDER — LISINOPRIL 5 MG PO TABS: 5.0000 mg | ORAL_TABLET | Freq: Every day | ORAL | Status: AC

## 2013-01-05 MED ORDER — HYDROCODONE-ACETAMINOPHEN 5-325 MG PO TABS: 1.0000 | ORAL_TABLET | ORAL | Status: DC | PRN

## 2013-01-05 MED ORDER — POTASSIUM CHLORIDE CRYS ER 20 MEQ PO TBCR: 20.0000 meq | EXTENDED_RELEASE_TABLET | Freq: Every day | ORAL | Status: DC

## 2013-01-05 NOTE — Discharge Summary (Signed)
Physician Discharge Summary  [name] Green:[id number] DOB: 06-Sep-1955 DOA: 01/03/2013  PCP: No PCP Per Patient  Admit date: 01/03/2013 Discharge date: 01/05/2013  Time spent: 40 minutes  Recommendations for Outpatient Follow-up:  Has appointment with Dr Felecia Shelling 01/10/13 for evaluation of symptoms. May benefit from OP cardiology referral. Recommend BMET as pt started on daily lasix  Discharge Diagnoses:  Principal Problem:   Acute CHF (congestive heart failure) Active Problems:   Obesity, unspecified   Metabolic syndrome   Type II or unspecified type diabetes mellitus without mention of complication, uncontrolled   Other and unspecified hyperlipidemia   Dyspnea   Tobacco abuse, in remission   Lower extremity edema   Orthopnea   Anemia   Unspecified constipation   Hemorrhoids, external   Chronic cough   Unspecified hypothyroidism   Depression with anxiety   Elevated transaminase level   Discharge Condition: stable  Diet recommendation: carb modified heart healthy  Filed Weights   01/04/13 0627 01/05/13 0600  Weight: 516 lb 11.2 oz (234.374 kg) 517 lb 3.2 oz (234.6 kg)    History of present illness:  [name] Green is a 57 y.o. female with metabolic syndrome, type 2 diabetes on insulin, hypertension and hypothyroidism who presented to the emergency department on 01/04/13 complaining of a two-week history of worsening shortness of breath. She had sporadic encounters with various physicians in the primary care setting but had never established care with a consistent Provider. The patient stated that she had shortness of breath and exertion for many months but in the prior 2 weeks it has become hard for her to walk just a few feet without significant difficulty. She is unable to lie flat and has been sleeping in a recliner for several days. She has worsening lower extremity edema. She denied any chest pain. No fevers chills or night sweats. She complained of a chronic cough which  is productive of copious clear mucus worse in the morning. Denied any hematochezia but does report constipation with bleeding hemodrrhoids. She has chronic lower extremity swelling and pain, no asymmetry. She reported issues with depression and anxiety and has a very sedentary lifestyle. She denied any known heart problems. She has a history of smoking but quit 5 years ago.    Hospital Course:  . Acute CHF (congestive heart failure): Diastolic.First presentation with signs and symptoms of congestive heart failure which included an elevated BNP and evidence of pulmonary edema on her chest x-ray. Admitted and given IV lasix. 2-D echo results yield systolic function was normal. The estimated ejection fraction was in the range of 60% to 65%. Wall motion was normal; there were no regional wall motion abnormalities. Features are consistent with a pseudonormal left ventricular filling pattern, with concomitant abnormal relaxation and increased filling pressure (grade2 diastolic dysfunction). Repeat EKG this am yields occasional PVC's not present yesterday. Cardiac enzymes negative to date. TSH 1.2. Continue ACE inhibitor. No beta blocker in setting of acute decompensation and mild hypotension. Continue Aspirin 81 mg . Will discharge on daily lasix as well  2. Obesity, unspecified w/ Metabolic syndrome: BMI 88.9. Provided education on obesity and nutritional needs  3. Type II or unspecified type diabetes mellitus without mention of complication, uncontrolled: Fair control. A1c 4.6.  4. Other and unspecified hyperlipidemia: Fasting Lipid Panel within the limits of normal.  5. Dyspnea: multifactorial-probable OHS/OSA, also has anemia. Improved this am.  6. Tobacco abuse, in remission  7. Lower extremity edema: Chronic problem related to obesity and lymphedema as  well as venous hypertension, additionally may be from heart failure. Decreased LE edema this am. Will continue lasix  8. Anemia: Unsure of baseline.  Likely multifactorial. No s/sx active bleeding. Will monitor  9. Unspecified constipation: Scheduled senna and docusate  10. Hemorrhoids, external: Potential source of GI blood loss.  11. Chronic cough: Unclear etiology may be related to heart failure. Improved at discharge  12. Unspecified hypothyroidism: TSH 1.2  13. Depression with anxiety: Started on Cymbalta and alprazolam scheduled at bedtime and twice daily when necessary for panic and anxiety. Stable at baseline   Procedures:    Consultations:  none  Discharge Exam: Filed Vitals:   01/04/13 2117 01/04/13 2133 01/05/13 0600 01/05/13 0805  BP:  110/68 115/69   Pulse:  97 91   Temp:  97.4 F (36.3 C) 97.4 F (36.3 C)   TempSrc:  Oral    Resp:  22 20   Height:      Weight:   517 lb 3.2 oz (234.6 kg)   SpO2: 98% 95% 99% 99%    General: morbidly obese NAD Cardiovascular: RRR No MGR trace-1 LE edema Respiratory: normal effort BS clear bilaterally no wheeze no crackles  Discharge Instructions  Discharge Orders   Future Orders Complete By Expires     Diet - low sodium heart healthy  As directed     Discharge instructions  As directed     Comments:      Follow up with PCP 1 week for evaluation of symptoms.  Recommend BMET for monitoring of potassium as being discharged on lasix    Increase activity slowly  As directed         Medication List         ALPRAZolam 0.25 MG tablet  Commonly known as:  XANAX  Take 1 tablet (0.25 mg total) by mouth 2 (two) times daily as needed for anxiety.     DULoxetine 20 MG capsule  Commonly known as:  CYMBALTA  Take 1 capsule (20 mg total) by mouth daily.     furosemide 40 MG tablet  Commonly known as:  LASIX  Take 1 tablet (40 mg total) by mouth daily.     gabapentin 300 MG capsule  Commonly known as:  NEURONTIN  Take 300 mg by mouth 3 (three) times daily.     HYDROcodone-acetaminophen 5-325 MG per tablet  Commonly known as:  NORCO/VICODIN  Take 1 tablet by mouth every  4 (four) hours as needed.     insulin glargine 100 UNIT/ML injection  Commonly known as:  LANTUS  Inject 10 Units into the skin at bedtime.     insulin lispro 100 UNIT/ML injection  Commonly known as:  HUMALOG  Inject 10 Units into the skin 3 (three) times daily before meals.     levothyroxine 200 MCG tablet  Commonly known as:  SYNTHROID, LEVOTHROID  Take 200 mcg by mouth daily before breakfast.     lisinopril 5 MG tablet  Commonly known as:  PRINIVIL,ZESTRIL  Take 1 tablet (5 mg total) by mouth daily.     PARoxetine 20 MG tablet  Commonly known as:  PAXIL  Take 20 mg by mouth every morning.     potassium chloride SA 20 MEQ tablet  Commonly known as:  K-DUR,KLOR-CON  Take 1 tablet (20 mEq total) by mouth daily.     simvastatin 40 MG tablet  Commonly known as:  ZOCOR  Take 40 mg by mouth every evening.  Allergies  Allergen Reactions  . Codeine Nausea And Vomiting  . Keflex (Cephalexin) Hives and Nausea And Vomiting  . Penicillins Nausea And Vomiting       Follow-up Information   Follow up with [facility], [place] On 01/10/2013. (10:00)    Contact information:   [address] [place] [address] [phone]        The results of significant diagnostics from this hospitalization (including imaging, microbiology, ancillary and laboratory) are listed below for reference.    Significant Diagnostic Studies: Dg Chest 1 View  01/04/2013   *RADIOLOGY REPORT*  Clinical Data: Shortness of breath, CHF, follow up  CHEST - 1 VIEW  Comparison: Portable exam 1236 hours compared to 01/03/2013  Findings: Light technique and body habitus limit exam. Enlargement of cardiac silhouette with pulmonary vascular congestion. Perihilar infiltrates likely pulmonary edema and CHF. No gross evidence of pneumothorax or acute osseous findings. Unable to exclude small pleural effusions at lung bases due to underpenetration.  IMPRESSION: Persistent probable CHF.   Original Report  Authenticated By: Ulyses Southward, M.D.   Dg Chest 2 View  01/03/2013   *RADIOLOGY REPORT*  Clinical Data: Shortness of breath and cough  CHEST - 2 VIEW  Comparison: Report from chest radiograph 10/01/2009.  Images not available to  Findings: Mild cardiomegaly.  There is a central and peripheral pulmonary vascular congestion.  There is diffuse interstitial prominence throughout both lungs with thickening of the minor fissure. Small airspace opacities versus atelectasis at the lung bases medially.  There are small bilateral pleural effusions.  The lateral view is slightly limited by respiratory motion.  No acute bony abnormality is identified.  IMPRESSION: Congestive heart failure pattern with small bilateral pleural effusions.   Original Report Authenticated By: Britta Mccreedy, M.D.    Microbiology: No results found for this or any previous visit (from the past 240 hour(s)).   Labs: Basic Metabolic Panel:  Recent Labs Lab 01/03/13 1727 01/04/13 0544 01/05/13 0544  NA 139 141 143  K 3.6 3.7 3.3*  CL 102 105 104  CO2 30 31 33*  GLUCOSE 152* 135* 138*  BUN 11 13 17   CREATININE 0.81 0.91 1.04  CALCIUM 8.9 8.6 8.4   Liver Function Tests:  Recent Labs Lab 01/03/13 2334  AST 36  ALT 15  ALKPHOS 132*  BILITOT 1.9*  PROT 7.0  ALBUMIN 3.2*   No results found for this basename: LIPASE, AMYLASE,  in the last 168 hours No results found for this basename: AMMONIA,  in the last 168 hours CBC:  Recent Labs Lab 01/03/13 1727 01/04/13 0544 01/05/13 0544  WBC 6.9 8.5 7.1  NEUTROABS 5.5  --   --   HGB 10.5* 10.0* 9.7*  HCT 32.1* 30.0* 29.4*  MCV 98.2 97.7 100.7*  PLT 116* 123* 112*   Cardiac Enzymes:  Recent Labs Lab 01/03/13 1727 01/03/13 2350 01/04/13 0544 01/04/13 1200  TROPONINI <0.30 <0.30 <0.30 <0.30   BNP: BNP (last 3 results)  Recent Labs  01/03/13 1727  PROBNP 306.3*   CBG:  Recent Labs Lab 01/04/13 0823 01/04/13 1200 01/04/13 1717 01/04/13 2116  01/05/13 0708  GLUCAP 121* 97 102* 86 87       Signed:  BLACK,KAREN M  [facility] 01/05/2013, 10:30 AM Attending: Patient is medically stable for discharge. She will need close followup as an outpatient with new primary care physician. We have discussed the importance of weight loss.

## 2013-01-05 NOTE — Progress Notes (Signed)
Pt discharged home today with home health per Dr. Karilyn Cota. Pt's IV site D/C'd and WNL. Pt's VS stable at this time. Pt provided with home medication list, discharge instructions, prescriptions, Living with Heart Failure packet, and electronic scale. Pt verbalized understanding. Pt left floor via WC in stable condition accompanied by NT.

## 2013-01-07 LAB — URINE CULTURE

## 2013-01-10 ENCOUNTER — Other Ambulatory Visit (HOSPITAL_COMMUNITY): Payer: Self-pay | Admitting: Internal Medicine

## 2013-01-10 ENCOUNTER — Ambulatory Visit (HOSPITAL_COMMUNITY)
Admission: RE | Admit: 2013-01-10 | Discharge: 2013-01-10 | Disposition: A | Discharge status: Home / Self Care | Payer: Medicaid Other | Source: Ambulatory Visit | Attending: Internal Medicine | Admitting: Internal Medicine

## 2013-01-10 DIAGNOSIS — I509 Heart failure, unspecified: Secondary | ICD-10-CM

## 2013-01-10 DIAGNOSIS — I517 Cardiomegaly: Secondary | ICD-10-CM | POA: Insufficient documentation

## 2013-04-25 DIAGNOSIS — D649 Anemia, unspecified: Secondary | ICD-10-CM

## 2013-04-25 DIAGNOSIS — B1920 Unspecified viral hepatitis C without hepatic coma: Secondary | ICD-10-CM

## 2013-04-25 DIAGNOSIS — R7989 Other specified abnormal findings of blood chemistry: Secondary | ICD-10-CM

## 2013-12-06 ENCOUNTER — Inpatient Hospital Stay (HOSPITAL_COMMUNITY)
Admission: EM | Admit: 2013-12-06 | Discharge: 2013-12-14 | DRG: 292 | Disposition: A | Discharge status: Home Health | Payer: Medicaid Other | Attending: Family Medicine | Admitting: Family Medicine

## 2013-12-06 ENCOUNTER — Emergency Department (HOSPITAL_COMMUNITY): Payer: Medicaid Other

## 2013-12-06 ENCOUNTER — Encounter (HOSPITAL_COMMUNITY): Payer: Self-pay | Admitting: Emergency Medicine

## 2013-12-06 DIAGNOSIS — I5021 Acute systolic (congestive) heart failure: Secondary | ICD-10-CM

## 2013-12-06 DIAGNOSIS — R600 Localized edema: Secondary | ICD-10-CM

## 2013-12-06 DIAGNOSIS — I959 Hypotension, unspecified: Secondary | ICD-10-CM | POA: Diagnosis not present

## 2013-12-06 DIAGNOSIS — N182 Chronic kidney disease, stage 2 (mild): Secondary | ICD-10-CM | POA: Diagnosis present

## 2013-12-06 DIAGNOSIS — J449 Chronic obstructive pulmonary disease, unspecified: Secondary | ICD-10-CM | POA: Diagnosis present

## 2013-12-06 DIAGNOSIS — Z6841 Body Mass Index (BMI) 40.0 and over, adult: Secondary | ICD-10-CM

## 2013-12-06 DIAGNOSIS — E785 Hyperlipidemia, unspecified: Secondary | ICD-10-CM | POA: Diagnosis present

## 2013-12-06 DIAGNOSIS — J4489 Other specified chronic obstructive pulmonary disease: Secondary | ICD-10-CM | POA: Diagnosis present

## 2013-12-06 DIAGNOSIS — K649 Unspecified hemorrhoids: Secondary | ICD-10-CM | POA: Diagnosis present

## 2013-12-06 DIAGNOSIS — I509 Heart failure, unspecified: Secondary | ICD-10-CM | POA: Diagnosis present

## 2013-12-06 DIAGNOSIS — I5031 Acute diastolic (congestive) heart failure: Principal | ICD-10-CM | POA: Diagnosis present

## 2013-12-06 DIAGNOSIS — D649 Anemia, unspecified: Secondary | ICD-10-CM | POA: Diagnosis present

## 2013-12-06 DIAGNOSIS — R21 Rash and other nonspecific skin eruption: Secondary | ICD-10-CM | POA: Diagnosis not present

## 2013-12-06 DIAGNOSIS — Z87891 Personal history of nicotine dependence: Secondary | ICD-10-CM

## 2013-12-06 DIAGNOSIS — R05 Cough: Secondary | ICD-10-CM

## 2013-12-06 DIAGNOSIS — Z794 Long term (current) use of insulin: Secondary | ICD-10-CM

## 2013-12-06 DIAGNOSIS — I129 Hypertensive chronic kidney disease with stage 1 through stage 4 chronic kidney disease, or unspecified chronic kidney disease: Secondary | ICD-10-CM | POA: Diagnosis present

## 2013-12-06 DIAGNOSIS — K5900 Constipation, unspecified: Secondary | ICD-10-CM | POA: Diagnosis not present

## 2013-12-06 DIAGNOSIS — E669 Obesity, unspecified: Secondary | ICD-10-CM | POA: Diagnosis present

## 2013-12-06 DIAGNOSIS — E662 Morbid (severe) obesity with alveolar hypoventilation: Secondary | ICD-10-CM | POA: Diagnosis present

## 2013-12-06 DIAGNOSIS — E875 Hyperkalemia: Secondary | ICD-10-CM | POA: Diagnosis not present

## 2013-12-06 DIAGNOSIS — R609 Edema, unspecified: Secondary | ICD-10-CM

## 2013-12-06 DIAGNOSIS — M25569 Pain in unspecified knee: Secondary | ICD-10-CM | POA: Diagnosis present

## 2013-12-06 DIAGNOSIS — E039 Hypothyroidism, unspecified: Secondary | ICD-10-CM | POA: Diagnosis present

## 2013-12-06 DIAGNOSIS — R053 Chronic cough: Secondary | ICD-10-CM

## 2013-12-06 DIAGNOSIS — R059 Cough, unspecified: Secondary | ICD-10-CM

## 2013-12-06 DIAGNOSIS — N179 Acute kidney failure, unspecified: Secondary | ICD-10-CM | POA: Diagnosis not present

## 2013-12-06 DIAGNOSIS — IMO0001 Reserved for inherently not codable concepts without codable children: Secondary | ICD-10-CM | POA: Diagnosis present

## 2013-12-06 DIAGNOSIS — E871 Hypo-osmolality and hyponatremia: Secondary | ICD-10-CM | POA: Diagnosis not present

## 2013-12-06 DIAGNOSIS — E1165 Type 2 diabetes mellitus with hyperglycemia: Secondary | ICD-10-CM

## 2013-12-06 HISTORY — DX: Heart failure, unspecified: I50.9

## 2013-12-06 LAB — TROPONIN I
Troponin I: <0.3 ng/mL (ref <0.30)
Troponin I: <0.3 ng/mL (ref <0.30)

## 2013-12-06 LAB — URINALYSIS, ROUTINE W REFLEX MICROSCOPIC
BILIRUBIN URINE: NEGATIVE
Glucose, UA: NEGATIVE mg/dL
HGB URINE DIPSTICK: NEGATIVE
Ketones, ur: NEGATIVE mg/dL
Leukocytes, UA: NEGATIVE
Nitrite: NEGATIVE
Protein, ur: NEGATIVE mg/dL
SPECIFIC GRAVITY, URINE: 1.015 (ref 1.005–1.030)
Urobilinogen, UA: 0.2 mg/dL (ref 0.0–1.0)
pH: 6 (ref 5.0–8.0)

## 2013-12-06 LAB — BASIC METABOLIC PANEL
BUN: 19 mg/dL (ref 6–23)
CO2: 26 mEq/L (ref 19–32)
Calcium: 8.9 mg/dL (ref 8.4–10.5)
Chloride: 102 mEq/L (ref 96–112)
Creatinine, Ser: 0.93 mg/dL (ref 0.50–1.10)
GFR calc Af Amer: 78 mL/min — ABNORMAL LOW (ref >90)
GFR calc non Af Amer: 67 mL/min — ABNORMAL LOW (ref >90)
Glucose, Bld: 124 mg/dL — ABNORMAL HIGH (ref 70–99)
Potassium: 4.3 mEq/L (ref 3.7–5.3)
Sodium: 138 mEq/L (ref 137–147)

## 2013-12-06 LAB — GLUCOSE, CAPILLARY
GLUCOSE-CAPILLARY: 188 mg/dL — AB (ref 70–99)
Glucose-Capillary: 114 mg/dL — ABNORMAL HIGH (ref 70–99)

## 2013-12-06 LAB — PRO B NATRIURETIC PEPTIDE: Pro B Natriuretic peptide (BNP): 268.1 pg/mL — ABNORMAL HIGH (ref 0–125)

## 2013-12-06 LAB — IRON AND TIBC
IRON: 48 ug/dL (ref 42–135)
Saturation Ratios: 16 % — ABNORMAL LOW (ref 20–55)
TIBC: 293 ug/dL (ref 250–470)
UIBC: 245 ug/dL (ref 125–400)

## 2013-12-06 LAB — CBC
HCT: 28 % — ABNORMAL LOW (ref 36.0–46.0)
Hemoglobin: 8.9 g/dL — ABNORMAL LOW (ref 12.0–15.0)
MCH: 30.4 pg (ref 26.0–34.0)
MCHC: 31.8 g/dL (ref 30.0–36.0)
MCV: 95.6 fL (ref 78.0–100.0)
Platelets: 136 10*3/uL — ABNORMAL LOW (ref 150–400)
RBC: 2.93 MIL/uL — ABNORMAL LOW (ref 3.87–5.11)
RDW: 16.9 % — ABNORMAL HIGH (ref 11.5–15.5)
WBC: 7 10*3/uL (ref 4.0–10.5)

## 2013-12-06 LAB — HEMOGLOBIN A1C
Hgb A1c MFr Bld: 5 % (ref <5.7)
Mean Plasma Glucose: 97 mg/dL (ref <117)

## 2013-12-06 LAB — CBG MONITORING, ED: Glucose-Capillary: 124 mg/dL — ABNORMAL HIGH (ref 70–99)

## 2013-12-06 MED ORDER — BIOTENE DRY MOUTH MT LIQD
15.0000 mL | Freq: Two times a day (BID) | OROMUCOSAL | Status: DC
  Administered 2013-12-06 – 2013-12-14 (×15): 15 mL via OROMUCOSAL

## 2013-12-06 MED ORDER — LISINOPRIL 5 MG PO TABS
5.0000 mg | ORAL_TABLET | Freq: Every day | ORAL | Status: DC
  Filled 2013-12-06: qty 1

## 2013-12-06 MED ORDER — DULOXETINE HCL 20 MG PO CPEP
20.0000 mg | ORAL_CAPSULE | Freq: Every day | ORAL | Status: DC
  Administered 2013-12-07 – 2013-12-14 (×8): 20 mg via ORAL
  Filled 2013-12-06 (×10): qty 1

## 2013-12-06 MED ORDER — MORPHINE SULFATE 2 MG/ML IJ SOLN
1.0000 mg | INTRAMUSCULAR | Status: DC | PRN
  Administered 2013-12-08 – 2013-12-12 (×4): 1 mg via INTRAVENOUS
  Filled 2013-12-06 (×4): qty 1

## 2013-12-06 MED ORDER — ASPIRIN EC 81 MG PO TBEC
81.0000 mg | DELAYED_RELEASE_TABLET | Freq: Every day | ORAL | Status: DC
  Administered 2013-12-07 – 2013-12-14 (×8): 81 mg via ORAL
  Filled 2013-12-06 (×8): qty 1

## 2013-12-06 MED ORDER — PROMETHAZINE HCL 12.5 MG PO TABS: 25.0000 mg | ORAL_TABLET | Freq: Four times a day (QID) | ORAL | Status: DC | PRN

## 2013-12-06 MED ORDER — CLONAZEPAM 0.5 MG PO TABS
1.0000 mg | ORAL_TABLET | Freq: Four times a day (QID) | ORAL | Status: DC | PRN
  Administered 2013-12-06 – 2013-12-11 (×10): 1 mg via ORAL
  Filled 2013-12-06 (×11): qty 2

## 2013-12-06 MED ORDER — ACETAMINOPHEN 325 MG PO TABS: 650.0000 mg | ORAL_TABLET | Freq: Four times a day (QID) | ORAL | Status: DC | PRN

## 2013-12-06 MED ORDER — LEVOTHYROXINE SODIUM 100 MCG PO TABS
200.0000 ug | ORAL_TABLET | Freq: Every day | ORAL | Status: DC
  Administered 2013-12-07 – 2013-12-14 (×8): 200 ug via ORAL
  Filled 2013-12-06 (×8): qty 2

## 2013-12-06 MED ORDER — TEMAZEPAM 15 MG PO CAPS
15.0000 mg | ORAL_CAPSULE | Freq: Every evening | ORAL | Status: DC | PRN
  Administered 2013-12-07 – 2013-12-13 (×2): 15 mg via ORAL
  Filled 2013-12-06 (×2): qty 1

## 2013-12-06 MED ORDER — DOCUSATE SODIUM 100 MG PO CAPS
100.0000 mg | ORAL_CAPSULE | Freq: Two times a day (BID) | ORAL | Status: DC
  Administered 2013-12-06 – 2013-12-14 (×16): 100 mg via ORAL
  Filled 2013-12-06 (×16): qty 1

## 2013-12-06 MED ORDER — IPRATROPIUM-ALBUTEROL 0.5-2.5 (3) MG/3ML IN SOLN
3.0000 mL | RESPIRATORY_TRACT | Status: DC
  Administered 2013-12-06 – 2013-12-07 (×5): 3 mL via RESPIRATORY_TRACT
  Filled 2013-12-06 (×4): qty 3

## 2013-12-06 MED ORDER — SODIUM CHLORIDE 0.9 % IJ SOLN
3.0000 mL | Freq: Two times a day (BID) | INTRAMUSCULAR | Status: DC
  Administered 2013-12-07 – 2013-12-13 (×9): 3 mL via INTRAVENOUS

## 2013-12-06 MED ORDER — ALBUTEROL SULFATE (2.5 MG/3ML) 0.083% IN NEBU
2.5000 mg | INHALATION_SOLUTION | Freq: Once | RESPIRATORY_TRACT | Status: AC
  Administered 2013-12-06: 2.5 mg via RESPIRATORY_TRACT

## 2013-12-06 MED ORDER — SODIUM CHLORIDE 0.9 % IV SOLN: 250.0000 mL | INTRAVENOUS | Status: DC | PRN

## 2013-12-06 MED ORDER — MAGNESIUM CITRATE PO SOLN: 1.0000 | Freq: Once | ORAL | Status: AC | PRN

## 2013-12-06 MED ORDER — PAROXETINE HCL 20 MG PO TABS
20.0000 mg | ORAL_TABLET | ORAL | Status: DC
  Administered 2013-12-07 – 2013-12-14 (×8): 20 mg via ORAL
  Filled 2013-12-06 (×8): qty 1

## 2013-12-06 MED ORDER — SODIUM CHLORIDE 0.9 % IJ SOLN
3.0000 mL | Freq: Two times a day (BID) | INTRAMUSCULAR | Status: DC
  Administered 2013-12-06 – 2013-12-12 (×4): 3 mL via INTRAVENOUS

## 2013-12-06 MED ORDER — POLYETHYLENE GLYCOL 3350 17 G PO PACK
17.0000 g | PACK | Freq: Every day | ORAL | Status: DC | PRN
  Administered 2013-12-10 – 2013-12-13 (×2): 17 g via ORAL
  Filled 2013-12-06 (×2): qty 1

## 2013-12-06 MED ORDER — INSULIN ASPART 100 UNIT/ML ~~LOC~~ SOLN
4.0000 [IU] | Freq: Three times a day (TID) | SUBCUTANEOUS | Status: DC
  Administered 2013-12-07 – 2013-12-14 (×14): 4 [IU] via SUBCUTANEOUS

## 2013-12-06 MED ORDER — ONDANSETRON HCL 4 MG/2ML IJ SOLN
4.0000 mg | Freq: Four times a day (QID) | INTRAMUSCULAR | Status: DC | PRN
  Administered 2013-12-07: 4 mg via INTRAVENOUS
  Filled 2013-12-06: qty 2

## 2013-12-06 MED ORDER — ACETAMINOPHEN 650 MG RE SUPP: 650.0000 mg | Freq: Four times a day (QID) | RECTAL | Status: DC | PRN

## 2013-12-06 MED ORDER — ALUM & MAG HYDROXIDE-SIMETH 200-200-20 MG/5ML PO SUSP: 30.0000 mL | Freq: Four times a day (QID) | ORAL | Status: DC | PRN

## 2013-12-06 MED ORDER — SIMVASTATIN 20 MG PO TABS
40.0000 mg | ORAL_TABLET | Freq: Every evening | ORAL | Status: DC
  Administered 2013-12-07 – 2013-12-13 (×7): 40 mg via ORAL
  Filled 2013-12-06 (×7): qty 2

## 2013-12-06 MED ORDER — IPRATROPIUM-ALBUTEROL 0.5-2.5 (3) MG/3ML IN SOLN
RESPIRATORY_TRACT | Status: AC
  Filled 2013-12-06: qty 3

## 2013-12-06 MED ORDER — BISACODYL 10 MG RE SUPP
10.0000 mg | Freq: Every day | RECTAL | Status: DC | PRN
  Filled 2013-12-06: qty 1

## 2013-12-06 MED ORDER — ONDANSETRON HCL 4 MG PO TABS
4.0000 mg | ORAL_TABLET | Freq: Four times a day (QID) | ORAL | Status: DC | PRN
  Administered 2013-12-07 – 2013-12-08 (×2): 4 mg via ORAL
  Filled 2013-12-06 (×2): qty 1

## 2013-12-06 MED ORDER — ADULT MULTIVITAMIN W/MINERALS CH
1.0000 | ORAL_TABLET | Freq: Every day | ORAL | Status: DC
  Administered 2013-12-06 – 2013-12-14 (×9): 1 via ORAL
  Filled 2013-12-06 (×9): qty 1

## 2013-12-06 MED ORDER — INSULIN ASPART 100 UNIT/ML ~~LOC~~ SOLN
0.0000 [IU] | Freq: Three times a day (TID) | SUBCUTANEOUS | Status: DC
  Administered 2013-12-08 – 2013-12-13 (×4): 3 [IU] via SUBCUTANEOUS

## 2013-12-06 MED ORDER — HYDROCODONE-ACETAMINOPHEN 5-325 MG PO TABS
1.0000 | ORAL_TABLET | ORAL | Status: DC | PRN
  Administered 2013-12-06 – 2013-12-14 (×17): 1 via ORAL
  Filled 2013-12-06 (×19): qty 1

## 2013-12-06 MED ORDER — SODIUM CHLORIDE 0.9 % IJ SOLN: 3.0000 mL | INTRAMUSCULAR | Status: DC | PRN

## 2013-12-06 MED ORDER — FUROSEMIDE 10 MG/ML IJ SOLN
40.0000 mg | Freq: Every day | INTRAMUSCULAR | Status: DC
  Administered 2013-12-07: 40 mg via INTRAVENOUS
  Filled 2013-12-06: qty 4

## 2013-12-06 MED ORDER — ALBUTEROL SULFATE (2.5 MG/3ML) 0.083% IN NEBU
5.0000 mg | INHALATION_SOLUTION | Freq: Once | RESPIRATORY_TRACT | Status: DC
  Filled 2013-12-06: qty 6

## 2013-12-06 MED ORDER — HEPARIN SODIUM (PORCINE) 5000 UNIT/ML IJ SOLN
5000.0000 [IU] | Freq: Three times a day (TID) | INTRAMUSCULAR | Status: DC
  Administered 2013-12-06 – 2013-12-14 (×23): 5000 [IU] via SUBCUTANEOUS
  Filled 2013-12-06 (×23): qty 1

## 2013-12-06 MED ORDER — VITAMIN B-1 100 MG PO TABS
100.0000 mg | ORAL_TABLET | Freq: Every day | ORAL | Status: DC
  Administered 2013-12-06 – 2013-12-14 (×9): 100 mg via ORAL
  Filled 2013-12-06 (×9): qty 1

## 2013-12-06 MED ORDER — HYDROCOD POLST-CHLORPHEN POLST 10-8 MG/5ML PO LQCR
5.0000 mL | Freq: Two times a day (BID) | ORAL | Status: DC | PRN
  Administered 2013-12-06 – 2013-12-14 (×3): 5 mL via ORAL
  Filled 2013-12-06 (×3): qty 5

## 2013-12-06 MED ORDER — FOLIC ACID 1 MG PO TABS
1.0000 mg | ORAL_TABLET | Freq: Every day | ORAL | Status: DC
  Administered 2013-12-06 – 2013-12-14 (×9): 1 mg via ORAL
  Filled 2013-12-06 (×9): qty 1

## 2013-12-06 MED ORDER — IPRATROPIUM BROMIDE 0.02 % IN SOLN: 0.5000 mg | Freq: Once | RESPIRATORY_TRACT | Status: DC

## 2013-12-06 MED ORDER — FUROSEMIDE 10 MG/ML IJ SOLN
60.0000 mg | Freq: Once | INTRAMUSCULAR | Status: AC
  Administered 2013-12-06: 60 mg via INTRAVENOUS
  Filled 2013-12-06: qty 6

## 2013-12-06 MED ORDER — POTASSIUM CHLORIDE CRYS ER 20 MEQ PO TBCR
40.0000 meq | EXTENDED_RELEASE_TABLET | Freq: Every day | ORAL | Status: DC
  Administered 2013-12-07: 40 meq via ORAL
  Filled 2013-12-06: qty 2

## 2013-12-06 NOTE — ED Notes (Signed)
Resp paged for breathing treatment.  

## 2013-12-06 NOTE — H&P (Signed)
[facility] History and Physical  [name] Snareunice C Cavazos ZOX:[id number] DOB: 1955/11/17 DOA: 12/06/2013  Referring physician: [name], MD PCP: Avon GullyFANTA,TESFAYE, MD   Chief Complaint: SOB Heart Failure  HPI: [name] Green is a 58 y.o. female with multiple medical problems presents to the hospital with increased shortness of breath. She states that she has associated ankle edema noted. She states that she has had some redness in the lower part of her ankles also. She notes there has been no chest pain she denies having fevers. She has had a cough and has had some sputum. She is a former smoker. Patient states that she has some chills and feels cold and that she also has low thyroid. Patient in addition has had generalized weakness and has been having more difficulty walking around. Patient states that she has had to use her inhaler more.   Review of Systems:  Constitutional:  No weight loss, night sweats, Fevers, chills, fatigue.  HEENT:  No headaches Cardio-vascular:  ++Orthopnea, ++PND, ++swelling in lower extremities, anasarca, dizziness GI:  No heartburn, indigestion, abdominal pain, nausea, vomiting Resp:  ++shortness of breath with exertion and at rest. No coughing up of blood.  Skin:  no rash or lesions.  GU:  no dysuria, change in color of urine, no urgency or frequency. No flank pain.  Musculoskeletal:  ++ Knee joint pain and swelling. No back pain.  Psych:  No change in mood or affect. No depression or anxiety. No memory loss.   Past Medical History  Diagnosis Date  . COPD (chronic obstructive pulmonary disease)   . Diabetes mellitus without complication   . Hypertension   . Thyroid disease   . Hyperlipemia   . Neuropathy   . Depression   . Chronic pain   . Anxiety   . CHF (congestive heart failure)    Past Surgical History  Procedure Laterality Date  . Cholecystectomy     Social History:  reports that she has quit smoking. She does not have any smokeless  tobacco history on file. She reports that she does not drink alcohol or use illicit drugs.  Allergies  Allergen Reactions  . Codeine Nausea And Vomiting  . Keflex [Cephalexin] Hives and Nausea And Vomiting  . Penicillins Nausea And Vomiting    No family history on file.   Prior to Admission medications   Medication Sig Start Date End Date Taking? Authorizing Provider  clonazePAM (KLONOPIN) 1 MG tablet Take 1 mg by mouth 4 (four) times daily as needed for anxiety.   Yes Historical Provider, MD  DULoxetine (CYMBALTA) 20 MG capsule Take 1 capsule (20 mg total) by mouth daily. 01/05/13  Yes Lesle ChrisKaren M Black, NP  furosemide (LASIX) 40 MG tablet Take 1 tablet (40 mg total) by mouth daily. 01/05/13  Yes Gwenyth BenderKaren M Black, NP  HYDROcodone-acetaminophen (NORCO/VICODIN) 5-325 MG per tablet Take 1 tablet by mouth every 4 (four) hours as needed for moderate pain.   Yes Historical Provider, MD  levothyroxine (SYNTHROID, LEVOTHROID) 200 MCG tablet Take 200 mcg by mouth daily before breakfast.   Yes Historical Provider, MD  lisinopril (PRINIVIL,ZESTRIL) 5 MG tablet Take 1 tablet (5 mg total) by mouth daily. 01/05/13  Yes Lesle ChrisKaren M Black, NP  PARoxetine (PAXIL) 20 MG tablet Take 20 mg by mouth every morning.   Yes Historical Provider, MD  potassium chloride SA (K-DUR,KLOR-CON) 20 MEQ tablet Take 40 mEq by mouth daily.   Yes Historical Provider, MD  promethazine (PHENERGAN) 25 MG tablet Take 25  mg by mouth every 6 (six) hours as needed for nausea or vomiting.   Yes Historical Provider, MD  simvastatin (ZOCOR) 40 MG tablet Take 40 mg by mouth every evening.   Yes Historical Provider, MD  temazepam (RESTORIL) 15 MG capsule Take 15 mg by mouth at bedtime as needed for sleep.   Yes Historical Provider, MD  insulin lispro (HUMALOG) 100 UNIT/ML injection Inject 2-10 Units into the skin 3 (three) times daily before meals. SS    Historical Provider, MD   Physical Exam: Filed Vitals:   12/06/13 1254  BP: 121/39  Pulse: 106    Temp: 98 F (36.7 C)  Resp: 23    BP 121/39  Pulse 106  Temp(Src) 98 F (36.7 C) (Oral)  Resp 23  Ht 5\' 6"  (1.676 m)  Wt 190.511 kg (420 lb)  BMI 67.82 kg/m2  SpO2 100%  General:  Appears calm and comfortable Eyes: PERRL, normal lids, irises & conjunctiva ENT: grossly normal hearing, lips & tongue Neck: no LAD, masses or thyromegaly Cardiovascular: RRR, no m/r/g. ++LE edema. Respiratory: CTA bilaterally, few scattered crackles. Abdomen: soft, ntnd Skin: no rash or induration seen on limited exam Musculoskeletal: grossly normal tone BUE/BLE Psychiatric: grossly normal mood and affect, speech fluent and appropriate Neurologic: grossly non-focal.          Labs on Admission:  Basic Metabolic Panel:  Recent Labs Lab 12/06/13 1315  NA 138  K 4.3  CL 102  CO2 26  GLUCOSE 124*  BUN 19  CREATININE 0.93  CALCIUM 8.9   Liver Function Tests: No results found for this basename: AST, ALT, ALKPHOS, BILITOT, PROT, ALBUMIN,  in the last 168 hours No results found for this basename: LIPASE, AMYLASE,  in the last 168 hours No results found for this basename: AMMONIA,  in the last 168 hours CBC:  Recent Labs Lab 12/06/13 1500  WBC 7.0  HGB 8.9*  HCT 28.0*  MCV 95.6  PLT 136*   Cardiac Enzymes:  Recent Labs Lab 12/06/13 1315  TROPONINI <0.30    BNP (last 3 results)  Recent Labs  01/03/13 1727 12/06/13 1315  PROBNP 306.3* 268.1*   CBG:  Recent Labs Lab 12/06/13 1350  GLUCAP 124*    Radiological Exams on Admission: Dg Chest Port 1 View  12/06/2013   CLINICAL DATA:  Shortness of breath, history COPD, diabetes, hypertension, CHF  EXAM: PORTABLE CHEST - 1 VIEW  COMPARISON:  Portable exam 1341 hr compared to 01/10/2013  FINDINGS: Enlargement of cardiac silhouette with pulmonary vascular congestion.  Mild perihilar infiltrates likely pulmonary edema and CHF.  No segmental consolidation, pleural effusion or pneumothorax.  Bones demineralized.  IMPRESSION:  Mild CHF.   Electronically Signed   By: Ulyses SouthwardMark  Boles M.D.   On: 12/06/2013 13:54    EKG: Independently reviewed. NSR  Assessment/Plan Active Problems:   Obesity, unspecified   Type II or unspecified type diabetes mellitus without mention of complication, uncontrolled   Anemia   Unspecified hypothyroidism   Acute CHF (congestive heart failure)   CHF (congestive heart failure)   1. CHF -mild CHF noted on CXR she does have chronic edema -will start on IV diuresis -monitor potassium closely will give po supplements -echo to assess LV -consider sleep study as an outpatient  2. DM Type II -will get A1C -monitor glucose with insulin coverage -carb modified diet  3. Anemia -will check iron studies -may need further GI evaluation  4. Hypothyroidism -will continue with synthroid -check TSH  5. Obesity -had a lengthy discussion regarding weight loss with the patient -will continue with low carb diet    Code Status: Full Code (must indicate code status--if unknown or must be presumed, indicate so) Family Communication: Sister in [place] in room (indicate person spoken with, if applicable, with phone number if by telephone) Disposition Plan: Home (indicate anticipated LOS)  Time spent:  [facility] Pager [phone]  **Disclaimer: This note may have been dictated with voice recognition software. Similar sounding words can inadvertently be transcribed and this note may contain transcription errors which may not have been corrected upon publication of note.**

## 2013-12-06 NOTE — ED Notes (Signed)
Short of breath for the past 2-3 weeks worse today

## 2013-12-06 NOTE — Progress Notes (Signed)
12/06/13 1837 Patient received lasix 60 mg IV in emergency department per report, see MAR. Order for lasix 40 mg IV this evening. Notified Dr. Welton FlakesKhan, stated lasix to start in the morning. Also notified patient requests "tussionex for my cough, it's what works for me". Pt has congested, nonproductive cough this evening. Orders received for tussionex 5 ml twice daily PRN for cough. Earnstine RegalAshley Rogers, RN

## 2013-12-06 NOTE — ED Provider Notes (Signed)
CSN: [id number]     Arrival date & time 12/06/13  1244 History  This chart was scribed for [name] RazorStephen Kohut, MD by [name] Green, ED scribe. This patient was seen in room APA07/APA07 and the patient's care was started at 1:33 PM.      Chief Complaint  Patient presents with  . Shortness of Breath   The history is provided by the patient. No language interpreter was used.   HPI Comments: [name] Green is a 58 y.o. female w/ hx of CHF presents to the Emergency Department complaining of gradually worsening SOB and weakness onset 2-3 weeks ago. Worse with ambulation and activity. Also reports intermittent productive cough and leg swelling with tightness. Uses an inhaler she reports provides minimal relief. On 3 L of O2 all the time; does not feel as if its working either. No pain to report. No urinary trouble. On thyroid medication. Compliant with all medications. Denies fever.    Past Medical History  Diagnosis Date  . COPD (chronic obstructive pulmonary disease)   . Diabetes mellitus without complication   . Hypertension   . Thyroid disease   . Hyperlipemia   . Neuropathy   . Depression   . Chronic pain   . Anxiety   . CHF (congestive heart failure)    Past Surgical History  Procedure Laterality Date  . Cholecystectomy     No family history on file. History  Substance Use Topics  . Smoking status: Former Games developermoker  . Smokeless tobacco: Not on file  . Alcohol Use: No   OB History   Grav Para Term Preterm Abortions TAB SAB Ect Mult Living                 Review of Systems  Cardiovascular: Positive for leg swelling.  Neurological: Positive for weakness.  All other systems reviewed and are negative.  Allergies  Codeine; Keflex; and Penicillins  Home Medications   Prior to Admission medications   Medication Sig Start Date End Date Taking? Authorizing Provider  DULoxetine (CYMBALTA) 20 MG capsule Take 1 capsule (20 mg total) by mouth daily. 01/05/13   Gwenyth BenderKaren M Black, NP   furosemide (LASIX) 40 MG tablet Take 1 tablet (40 mg total) by mouth daily. 01/05/13   Gwenyth BenderKaren M Black, NP  insulin lispro (HUMALOG) 100 UNIT/ML injection Inject 10 Units into the skin 3 (three) times daily before meals.    Historical Provider, MD  levothyroxine (SYNTHROID, LEVOTHROID) 200 MCG tablet Take 200 mcg by mouth daily before breakfast.    Historical Provider, MD  lisinopril (PRINIVIL,ZESTRIL) 5 MG tablet Take 1 tablet (5 mg total) by mouth daily. 01/05/13   Gwenyth BenderKaren M Black, NP  PARoxetine (PAXIL) 20 MG tablet Take 20 mg by mouth every morning.    Historical Provider, MD  potassium chloride SA (K-DUR,KLOR-CON) 20 MEQ tablet Take 1 tablet (20 mEq total) by mouth daily. 01/05/13   Gwenyth BenderKaren M Black, NP  simvastatin (ZOCOR) 40 MG tablet Take 40 mg by mouth every evening.    Historical Provider, MD   Triage vitals: BP 121/39  Pulse 106  Temp(Src) 98 F (36.7 C) (Oral)  Resp 23  Ht 5\' 6"  (1.676 m)  Wt 420 lb (190.511 kg)  BMI 67.82 kg/m2  SpO2 100%  Physical Exam  Nursing note and vitals reviewed. Constitutional: She is oriented to person, place, and time. She appears well-developed and well-nourished.  Morbidly obese  HENT:  Head: Normocephalic.  Eyes: EOM are normal.  Neck: Normal range  of motion.  Cardiovascular: Regular rhythm.  Tachycardia present.   Pulmonary/Chest: Effort normal. She has wheezes.  Crackles in right base. Expiratory wheezing diffusely.   Abdominal: She exhibits no distension.  Musculoskeletal: Normal range of motion.  Symmetrical pitting and lower extremity edema.   Neurological: She is alert and oriented to person, place, and time.  Psychiatric: She has a normal mood and affect.    ED Course  Procedures (including critical care time) DIAGNOSTIC STUDIES: Oxygen Saturation is 100% on 3 L of 02, normal by my interpretation.    COORDINATION OF CARE: At 1:38 PM: Discussed treatment plan with patient which includes CXR and blood work. Patient agrees.    Labs  Review Labs Reviewed  BASIC METABOLIC PANEL - Abnormal; Notable for the following:    Glucose, Bld 124 (*)    GFR calc non Af Amer 67 (*)    GFR calc Af Amer 78 (*)    All other components within normal limits  PRO B NATRIURETIC PEPTIDE - Abnormal; Notable for the following:    Pro B Natriuretic peptide (BNP) 268.1 (*)    All other components within normal limits  CBC - Abnormal; Notable for the following:    RBC 2.93 (*)    Hemoglobin 8.9 (*)    HCT 28.0 (*)    RDW 16.9 (*)    Platelets 136 (*)    All other components within normal limits  CBG MONITORING, ED - Abnormal; Notable for the following:    Glucose-Capillary 124 (*)    All other components within normal limits  TROPONIN I    Imaging Review Dg Chest Port 1 View  12/06/2013   CLINICAL DATA:  Shortness of breath, history COPD, diabetes, hypertension, CHF  EXAM: PORTABLE CHEST - 1 VIEW  COMPARISON:  Portable exam 1341 hr compared to 01/10/2013  FINDINGS: Enlargement of cardiac silhouette with pulmonary vascular congestion.  Mild perihilar infiltrates likely pulmonary edema and CHF.  No segmental consolidation, pleural effusion or pneumothorax.  Bones demineralized.  IMPRESSION: Mild CHF.   Electronically Signed   By: Ulyses SouthwardMark  Boles M.D.   On: 12/06/2013 13:54     EKG Interpretation   Date/Time:  Wednesday December 06 2013 13:06:31 EDT Ventricular Rate:  105 PR Interval:  132 QRS Duration: 83 QT Interval:  349 QTC Calculation: 461 R Axis:   47 Text Interpretation:  Sinus tachycardia Low voltage, extremity and  precordial leads Abnormal R-wave progression, early transition  Non-specific ST-t changes Confirmed by [name]  MD, STEPHEN ([id number]) on  12/07/2013 11:27:56 AM      MDM   Final diagnoses:  Acute systolic congestive heart failure  Chronic cough  Bilateral edema of lower extremity  Obesity, unspecified  Type II or unspecified type diabetes mellitus without mention of complication, uncontrolled   58yF with dyspnea.  Likely multifactorial. Morbidly obese. Hx of COPD and does have some wheezing. Crackles R base and CXR with vascular congestion. Cites worsening LE edema. Nebs, steroids, diuretics. She doesn't appear toxic but remains with some increased WOB.    I personally performed the services described in this documentation, which was scribed in my presence. The recorded information has been reviewed and is accurate.     [name] RazorStephen Kohut, MD 12/07/13 1128

## 2013-12-07 LAB — GLUCOSE, CAPILLARY
GLUCOSE-CAPILLARY: 108 mg/dL — AB (ref 70–99)
Glucose-Capillary: 105 mg/dL — ABNORMAL HIGH (ref 70–99)
Glucose-Capillary: 119 mg/dL — ABNORMAL HIGH (ref 70–99)

## 2013-12-07 LAB — COMPREHENSIVE METABOLIC PANEL
ALK PHOS: 174 U/L — AB (ref 39–117)
ALT: 15 U/L (ref 0–35)
AST: 31 U/L (ref 0–37)
Albumin: 2.5 g/dL — ABNORMAL LOW (ref 3.5–5.2)
BUN: 22 mg/dL (ref 6–23)
CALCIUM: 8.4 mg/dL (ref 8.4–10.5)
CHLORIDE: 101 meq/L (ref 96–112)
CO2: 27 meq/L (ref 19–32)
Creatinine, Ser: 1.12 mg/dL — ABNORMAL HIGH (ref 0.50–1.10)
GFR calc Af Amer: 62 mL/min — ABNORMAL LOW (ref >90)
GFR, EST NON AFRICAN AMERICAN: 54 mL/min — AB (ref >90)
GLUCOSE: 142 mg/dL — AB (ref 70–99)
Potassium: 4.3 mEq/L (ref 3.7–5.3)
SODIUM: 137 meq/L (ref 137–147)
Total Bilirubin: 1.1 mg/dL (ref 0.3–1.2)
Total Protein: 6.1 g/dL (ref 6.0–8.3)

## 2013-12-07 LAB — TROPONIN I: Troponin I: <0.3 ng/mL (ref <0.30)

## 2013-12-07 LAB — CBC
HCT: 27.9 % — ABNORMAL LOW (ref 36.0–46.0)
HEMOGLOBIN: 8.8 g/dL — AB (ref 12.0–15.0)
MCH: 30.3 pg (ref 26.0–34.0)
MCHC: 31.5 g/dL (ref 30.0–36.0)
MCV: 96.2 fL (ref 78.0–100.0)
PLATELETS: 141 10*3/uL — AB (ref 150–400)
RBC: 2.9 MIL/uL — ABNORMAL LOW (ref 3.87–5.11)
RDW: 17.2 % — AB (ref 11.5–15.5)
WBC: 6.9 10*3/uL (ref 4.0–10.5)

## 2013-12-07 LAB — TSH: TSH: 4.16 u[IU]/mL (ref 0.350–4.500)

## 2013-12-07 MED ORDER — IPRATROPIUM-ALBUTEROL 0.5-2.5 (3) MG/3ML IN SOLN
3.0000 mL | Freq: Four times a day (QID) | RESPIRATORY_TRACT | Status: DC
  Administered 2013-12-07 – 2013-12-13 (×23): 3 mL via RESPIRATORY_TRACT
  Filled 2013-12-07 (×23): qty 3

## 2013-12-07 NOTE — Progress Notes (Signed)
UR chart review completed.  

## 2013-12-07 NOTE — Progress Notes (Signed)
Patient complaining of pain and nausea. Zofran and Norco given as ordered.

## 2013-12-07 NOTE — Progress Notes (Signed)
*  PRELIMINARY RESULTS* Echocardiogram 2D Echocardiogram has been performed.  Jeryl ColumbiaLLIOTT, JOHANNA 12/07/2013, 4:21 PM

## 2013-12-07 NOTE — Progress Notes (Signed)
Subjective: Patient was admitted lasting due shortness of breath. She is started on iv lasix for possible decompensated CHF. Echo is pending. She had echo in 2014 which showed grade II diastolic dysfunction with normal LV function.  Objective: Vital signs in last 24 hours: Temp:  [97.6 F (36.4 C)-98.1 F (36.7 C)] 97.6 F (36.4 C) (06/25 0643) Pulse Rate:  [98-108] 98 (06/25 0643) Resp:  [19-23] 20 (06/25 0643) BP: (88-121)/(39-74) 100/39 mmHg (06/25 0643) SpO2:  [97 %-100 %] 98 % (06/25 0704) Weight:  [173.3 kg (382 lb 0.9 oz)-190.511 kg (420 lb)] 175 kg (385 lb 12.9 oz) (06/25 2992) Weight change:  Last BM Date: 12/05/13  Intake/Output from previous day: 06/24 0701 - 06/25 0700 In: 480 [P.O.:480] Out: -   PHYSICAL EXAM General appearance: alert and no distress Resp: clear to auscultation bilaterally Cardio: S1, S2 normal GI: soft, non-tender; bowel sounds normal; no masses,  no organomegaly Extremities: 2++ leg edema  Lab Results:  Results for orders placed during the hospital encounter of 12/06/13 (from the past 48 hour(s))  BASIC METABOLIC PANEL     Status: Abnormal   Collection Time    12/06/13  1:15 PM      Result Value Ref Range   Sodium 138  137 - 147 mEq/L   Potassium 4.3  3.7 - 5.3 mEq/L   Chloride 102  96 - 112 mEq/L   CO2 26  19 - 32 mEq/L   Glucose, Bld 124 (*) 70 - 99 mg/dL   BUN 19  6 - 23 mg/dL   Creatinine, Ser 0.93  0.50 - 1.10 mg/dL   Calcium 8.9  8.4 - 10.5 mg/dL   GFR calc non Af Amer 67 (*) >90 mL/min   GFR calc Af Amer 78 (*) >90 mL/min   Comment: (NOTE)     The eGFR has been calculated using the CKD EPI equation.     This calculation has not been validated in all clinical situations.     eGFR's persistently <90 mL/min signify possible Chronic Kidney     Disease.  PRO B NATRIURETIC PEPTIDE     Status: Abnormal   Collection Time    12/06/13  1:15 PM      Result Value Ref Range   Pro B Natriuretic peptide (BNP) 268.1 (*) 0 - 125 pg/mL   TROPONIN I     Status: None   Collection Time    12/06/13  1:15 PM      Result Value Ref Range   Troponin I <0.30  <0.30 ng/mL   Comment:            Due to the release kinetics of cTnI,     a negative result within the first hours     of the onset of symptoms does not rule out     myocardial infarction with certainty.     If myocardial infarction is still suspected,     repeat the test at appropriate intervals.  CBG MONITORING, ED     Status: Abnormal   Collection Time    12/06/13  1:50 PM      Result Value Ref Range   Glucose-Capillary 124 (*) 70 - 99 mg/dL  CBC     Status: Abnormal   Collection Time    12/06/13  3:00 PM      Result Value Ref Range   WBC 7.0  4.0 - 10.5 K/uL   RBC 2.93 (*) 3.87 - 5.11 MIL/uL   Hemoglobin 8.9 (*)  12.0 - 15.0 g/dL   HCT 28.0 (*) 36.0 - 46.0 %   MCV 95.6  78.0 - 100.0 fL   MCH 30.4  26.0 - 34.0 pg   MCHC 31.8  30.0 - 36.0 g/dL   RDW 16.9 (*) 11.5 - 15.5 %   Platelets 136 (*) 150 - 400 K/uL  URINALYSIS, ROUTINE W REFLEX MICROSCOPIC     Status: None   Collection Time    12/06/13  4:50 PM      Result Value Ref Range   Color, Urine YELLOW  YELLOW   APPearance CLEAR  CLEAR   Specific Gravity, Urine 1.015  1.005 - 1.030   pH 6.0  5.0 - 8.0   Glucose, UA NEGATIVE  NEGATIVE mg/dL   Hgb urine dipstick NEGATIVE  NEGATIVE   Bilirubin Urine NEGATIVE  NEGATIVE   Ketones, ur NEGATIVE  NEGATIVE mg/dL   Protein, ur NEGATIVE  NEGATIVE mg/dL   Urobilinogen, UA 0.2  0.0 - 1.0 mg/dL   Nitrite NEGATIVE  NEGATIVE   Leukocytes, UA NEGATIVE  NEGATIVE   Comment: MICROSCOPIC NOT DONE ON URINES WITH NEGATIVE PROTEIN, BLOOD, LEUKOCYTES, NITRITE, OR GLUCOSE <1000 mg/dL.  TSH     Status: None   Collection Time    12/06/13  5:19 PM      Result Value Ref Range   TSH 4.160  0.350 - 4.500 uIU/mL   Comment: Performed at [facility]  TROPONIN I     Status: None   Collection Time    12/06/13  5:19 PM      Result Value Ref Range   Troponin I <0.30  <0.30  ng/mL   Comment:            Due to the release kinetics of cTnI,     a negative result within the first hours     of the onset of symptoms does not rule out     myocardial infarction with certainty.     If myocardial infarction is still suspected,     repeat the test at appropriate intervals.  HEMOGLOBIN A1C     Status: None   Collection Time    12/06/13  5:19 PM      Result Value Ref Range   Hemoglobin A1C 5.0  <5.7 %   Comment: (NOTE)                                                                               According to the ADA Clinical Practice Recommendations for 2011, when     HbA1c is used as a screening test:      >=6.5%   Diagnostic of Diabetes Mellitus               (if abnormal result is confirmed)     5.7-6.4%   Increased risk of developing Diabetes Mellitus     References:Diagnosis and Classification of Diabetes Mellitus,Diabetes     NKNL,9767,34(LPFXT 1):S62-S69 and Standards of Medical Care in             Diabetes - 2011,Diabetes Care,2011,34 (Suppl 1):S11-S61.   Mean Plasma Glucose 97  <117 mg/dL   Comment: Performed at [place]  AND [facility]     Status: Abnormal   Collection Time    12/06/13  5:19 PM      Result Value Ref Range   Iron 48  42 - 135 ug/dL   TIBC 293  250 - 470 ug/dL   Saturation Ratios 16 (*) 20 - 55 %   UIBC 245  125 - 400 ug/dL   Comment: Performed at [place], [facility]     Status: Abnormal   Collection Time    12/06/13  5:36 PM      Result Value Ref Range   Glucose-Capillary 114 (*) 70 - 99 mg/dL   Comment 1 Notify RN     Comment 2 Documented in Chart    GLUCOSE, CAPILLARY     Status: Abnormal   Collection Time    12/06/13  9:08 PM      Result Value Ref Range   Glucose-Capillary 188 (*) 70 - 99 mg/dL   Comment 1 Documented in Chart     Comment 2 Notify RN    TROPONIN I     Status: None   Collection Time    12/06/13 10:42 PM      Result Value Ref Range   Troponin I <0.30  <0.30 ng/mL    Comment:            Due to the release kinetics of cTnI,     a negative result within the first hours     of the onset of symptoms does not rule out     myocardial infarction with certainty.     If myocardial infarction is still suspected,     repeat the test at appropriate intervals.  TROPONIN I     Status: None   Collection Time    12/07/13  5:50 AM      Result Value Ref Range   Troponin I <0.30  <0.30 ng/mL   Comment:            Due to the release kinetics of cTnI,     a negative result within the first hours     of the onset of symptoms does not rule out     myocardial infarction with certainty.     If myocardial infarction is still suspected,     repeat the test at appropriate intervals.  COMPREHENSIVE METABOLIC PANEL     Status: Abnormal   Collection Time    12/07/13  5:50 AM      Result Value Ref Range   Sodium 137  137 - 147 mEq/L   Potassium 4.3  3.7 - 5.3 mEq/L   Chloride 101  96 - 112 mEq/L   CO2 27  19 - 32 mEq/L   Glucose, Bld 142 (*) 70 - 99 mg/dL   BUN 22  6 - 23 mg/dL   Creatinine, Ser 1.12 (*) 0.50 - 1.10 mg/dL   Calcium 8.4  8.4 - 10.5 mg/dL   Total Protein 6.1  6.0 - 8.3 g/dL   Albumin 2.5 (*) 3.5 - 5.2 g/dL   AST 31  0 - 37 U/L   ALT 15  0 - 35 U/L   Alkaline Phosphatase 174 (*) 39 - 117 U/L   Total Bilirubin 1.1  0.3 - 1.2 mg/dL   GFR calc non Af Amer 54 (*) >90 mL/min   GFR calc Af Amer 62 (*) >90 mL/min   Comment: (NOTE)     The eGFR has been calculated using the  CKD EPI equation.     This calculation has not been validated in all clinical situations.     eGFR's persistently <90 mL/min signify possible Chronic Kidney     Disease.  CBC     Status: Abnormal   Collection Time    12/07/13  5:50 AM      Result Value Ref Range   WBC 6.9  4.0 - 10.5 K/uL   RBC 2.90 (*) 3.87 - 5.11 MIL/uL   Hemoglobin 8.8 (*) 12.0 - 15.0 g/dL   HCT 27.9 (*) 36.0 - 46.0 %   MCV 96.2  78.0 - 100.0 fL   MCH 30.3  26.0 - 34.0 pg   MCHC 31.5  30.0 - 36.0 g/dL   RDW  17.2 (*) 11.5 - 15.5 %   Platelets 141 (*) 150 - 400 K/uL  GLUCOSE, CAPILLARY     Status: Abnormal   Collection Time    12/07/13  7:27 AM      Result Value Ref Range   Glucose-Capillary 105 (*) 70 - 99 mg/dL   Comment 1 Documented in Chart     Comment 2 Notify RN      ABGS No results found for this basename: PHART, PCO2, PO2ART, TCO2, HCO3,  in the last 72 hours CULTURES No results found for this or any previous visit (from the past 240 hour(s)). Studies/Results: Dg Chest Port 1 View  12/06/2013   CLINICAL DATA:  Shortness of breath, history COPD, diabetes, hypertension, CHF  EXAM: PORTABLE CHEST - 1 VIEW  COMPARISON:  Portable exam 1341 hr compared to 01/10/2013  FINDINGS: Enlargement of cardiac silhouette with pulmonary vascular congestion.  Mild perihilar infiltrates likely pulmonary edema and CHF.  No segmental consolidation, pleural effusion or pneumothorax.  Bones demineralized.  IMPRESSION: Mild CHF.   Electronically Signed   By: Lavonia Dana M.D.   On: 12/06/2013 13:54    Medications: I have reviewed the patient's current medications.  Assesment:  Active Problems:   Obesity, unspecified   Type II or unspecified type diabetes mellitus without mention of complication, uncontrolled   Anemia   Unspecified hypothyroidism   Acute CHF (congestive heart failure)   CHF (congestive heart failure)  Plan: Medications reviewed Continue Iv diuretics for now Will follow echo result Stool for occult plan

## 2013-12-07 NOTE — Care Management Note (Addendum)
    Page 1 of 2   12/14/2013     9:35:47 AM CARE MANAGEMENT NOTE 12/14/2013  Patient:  [name] Green,[name] Green   Account Number:  [id number]  Date Initiated:  12/07/2013  Documentation initiated by:  Sharrie RothmanBLACKWELL,Dametri Ozburn Green  Subjective/Objective Assessment:   Pt admitted from home with CHF. Pt lives alone and has family that is supportive of the pt. Pt has home O2 with [facility] and has an old cane. Pt is requesting quad cane at discharge.     Action/Plan:   Will continue to follow for discharge planning needs. Will arrange quad cane at discharge. Nutritional consult arranged. Pt may need HH RN at discharge.   Anticipated DC Date:  12/11/2013   Anticipated DC Plan:  HOME W HOME HEALTH SERVICES      DC Planning Services  CM consult      [facility]  [facility]ALTH  DURABLE MEDICAL EQUIPMENT   Choice offered to / List presented to:  Green-1 Patient   DME arranged  NEBULIZER MACHINE  [facility] - ROLLING      DME agency  [facility]     HH arranged  HH-1 RN  HH-2 PT  HH-4 NURSE'S AIDE      HH agency  [facility]   Status of service:  Completed, signed off Medicare Important Message given?   (If response is "NO", the following Medicare IM given date fields will be blank) Date Medicare IM given:   Medicare IM given by:   Date Additional Medicare IM given:   Additional Medicare IM given by:    Discharge Disposition:  HOME W HOME HEALTH SERVICES  Per UR Regulation:    If discussed at Long Length of Stay Meetings, dates discussed:   12/12/2013    Comments:  12/14/13 0920 Arlyss Queenammy Raquell Richer, RN BSN CM [facility], and aide (per pts choice). [facility] aware and will collect the pts information from the chart. HH services to start within 48 hours of discharge. Neb machine [facility]. [facility] to bring portable O2 to pts room prior to discharge. Pt and pts nurse aware of discharge arrangements.  12/07/13 1205 Arlyss Queenammy  Jarreau Callanan, RN BSN CM

## 2013-12-07 NOTE — Plan of Care (Signed)
Problem: Food- and Nutrition-Related Knowledge Deficit (NB-1.1) Goal: Nutrition education Formal process to instruct or train a patient/client in a skill or to impart knowledge to help patients/clients voluntarily manage or modify food choices and eating behavior to maintain or improve health. Outcome: Adequate for Discharge Nutrition Education Note  RD consulted for nutrition education regarding  CHF. Pt has limited mobility. She usually eats canned spaghetti, pintos and other conviencnce foods. Her son says she's not able to take care of herself. Pt says she does her own food shopping but unable to stand long enough for any extensive meal prepration.  RD provided "Low Sodium Nutrition Therapy" handout from the Academy of [employer]etics. Reviewed patient's dietary recall. Provided examples on ways to decrease sodium intake in diet. Discouraged intake of processed foods and use of salt shaker. Encouraged fresh fruits and vegetables as well as whole grain sources of carbohydrates to maximize fiber intake.   RD discussed why it is important for patient to adhere to diet recommendations, and emphasized the role of fluids, foods to avoid. Teach back method used. Question pt ability of pt to weigh herself daily?  Expect poor compliance. Son says pt has "always" had poor nutrition habits for example she drinks primarily regular soft drinks and eats food with little nutritional value.  Body mass index is 62.3 kg/(m^2). Pt meets criteria for obesity class III based on current BMI.  Current diet order is Heart Healthy , patient is consuming approximately 20% of meals at this time. Labs and medications reviewed. No further nutrition interventions warranted at this time. RD contact information provided. If additional nutrition issues arise, please re-consult RD.   Royann ShiversLynn Weisner MS,RD,CSG,LDN Office: [phone] Pager: [phone]

## 2013-12-07 NOTE — Progress Notes (Signed)
Patient urine OP in foley is 200 cc since beginning of shift.  Family voicing concerns about not having much OP.  Received 40mg  IV lasix this AM.  Dr. Karilyn CotaGosrani and notified and made aware.  No new orders at this time.  Patient does not appear to be in distress or SOB at rest.  MD will see and evaluate in morning.

## 2013-12-07 NOTE — Progress Notes (Signed)
Message left with Dr. Letitia NeriFanta's receptionist regarding patients low blood pressure and that Lisinopril was held.

## 2013-12-08 ENCOUNTER — Inpatient Hospital Stay (HOSPITAL_COMMUNITY): Payer: Medicaid Other

## 2013-12-08 LAB — GLUCOSE, CAPILLARY
GLUCOSE-CAPILLARY: 97 mg/dL (ref 70–99)
GLUCOSE-CAPILLARY: 140 mg/dL — AB (ref 70–99)
Glucose-Capillary: 194 mg/dL — ABNORMAL HIGH (ref 70–99)
Glucose-Capillary: 98 mg/dL (ref 70–99)
Glucose-Capillary: 99 mg/dL (ref 70–99)

## 2013-12-08 LAB — CBC
HCT: 27.9 % — ABNORMAL LOW (ref 36.0–46.0)
Hemoglobin: 8.8 g/dL — ABNORMAL LOW (ref 12.0–15.0)
MCH: 30.4 pg (ref 26.0–34.0)
MCHC: 31.5 g/dL (ref 30.0–36.0)
MCV: 96.5 fL (ref 78.0–100.0)
PLATELETS: 146 10*3/uL — AB (ref 150–400)
RBC: 2.89 MIL/uL — ABNORMAL LOW (ref 3.87–5.11)
RDW: 17.2 % — ABNORMAL HIGH (ref 11.5–15.5)
WBC: 6.8 10*3/uL (ref 4.0–10.5)

## 2013-12-08 LAB — BASIC METABOLIC PANEL
BUN: 30 mg/dL — ABNORMAL HIGH (ref 6–23)
CALCIUM: 9 mg/dL (ref 8.4–10.5)
CO2: 27 meq/L (ref 19–32)
CREATININE: 1.77 mg/dL — AB (ref 0.50–1.10)
Chloride: 102 mEq/L (ref 96–112)
GFR calc Af Amer: 36 mL/min — ABNORMAL LOW (ref >90)
GFR, EST NON AFRICAN AMERICAN: 31 mL/min — AB (ref >90)
GLUCOSE: 98 mg/dL (ref 70–99)
Potassium: 6.1 mEq/L — ABNORMAL HIGH (ref 3.7–5.3)
Sodium: 138 mEq/L (ref 137–147)

## 2013-12-08 MED ORDER — FUROSEMIDE 40 MG PO TABS
40.0000 mg | ORAL_TABLET | Freq: Every day | ORAL | Status: DC
  Administered 2013-12-08: 40 mg via ORAL
  Filled 2013-12-08 (×2): qty 1

## 2013-12-08 NOTE — Progress Notes (Signed)
Pt complaining of pain in right knee. MD was notified and xray of right knee was ordered. Will continue to monitor.

## 2013-12-08 NOTE — Progress Notes (Signed)
Pt met criteria to discontinue telemetry.  CCMD has been notified and pt has been removed per transcribed order.

## 2013-12-08 NOTE — Progress Notes (Signed)
  [name] Green ZOX:[id number] DOB: Feb 27, 1956 DOA: 12/06/2013 PCP:    Subjective: This lady is somewhat improved with her breathing. She presented with possible mild congestive heart failure. BNP was not very impressive. Chest x-ray was difficult to read because of her morbid obesity.           Physical Exam: Blood pressure 88/43, pulse 97, temperature 97.7 F (36.5 C), temperature source Oral, resp. rate 18, height 5\' 6"  (1.676 m), weight 175 kg (385 lb 12.9 oz), SpO2 100.00%. She is morbidly obese. Lung fields show inspiratory crackles and some wheezing. There is no bronchial breathing.   Investigations:  No results found for this or any previous visit (from the past 240 hour(s)).   Basic Metabolic Panel:  Recent Labs  81/19/1406/25/15 0550 12/08/13 0601  NA 137 138  K 4.3 6.1*  CL 101 102  CO2 27 27  GLUCOSE 142* 98  BUN 22 30*  CREATININE 1.12* 1.77*  CALCIUM 8.4 9.0   Liver Function Tests:  Recent Labs  12/07/13 0550  AST 31  ALT 15  ALKPHOS 174*  BILITOT 1.1  PROT 6.1  ALBUMIN 2.5*     CBC:  Recent Labs  12/07/13 0550 12/08/13 0601  WBC 6.9 6.8  HGB 8.8* 8.8*  HCT 27.9* 27.9*  MCV 96.2 96.5  PLT 141* 146*    Dg Chest Port 1 View  12/06/2013   CLINICAL DATA:  Shortness of breath, history COPD, diabetes, hypertension, CHF  EXAM: PORTABLE CHEST - 1 VIEW  COMPARISON:  Portable exam 1341 hr compared to 01/10/2013  FINDINGS: Enlargement of cardiac silhouette with pulmonary vascular congestion.  Mild perihilar infiltrates likely pulmonary edema and CHF.  No segmental consolidation, pleural effusion or pneumothorax.  Bones demineralized.  IMPRESSION: Mild CHF.   Electronically Signed   By: Ulyses SouthwardMark  Boles M.D.   On: 12/06/2013 13:54      Medications: I have reviewed the patient's current medications.  Impression: 1. Possible mild acute congestive heart failure. Echocardiogram report is pending. 2. Hyperkalemia and worsening renal function. 3. Type 2  diabetes mellitus. 4. Morbid obesity.     Plan:  1. Discontinue potassium supplementation. 2. Change intravenous Lasix to oral. 3. Follow electrolytes. 4. Await echocardiogram report.  Consultants:  None.   Procedures:  None.   Antibiotics:  None.                   Code Status: Full code.  Family Communication: I discussed the plan with the patient at the bedside.   Disposition Plan: Home when medically stable. Possibly tomorrow.  Time spent: 15 minutes.   LOS: 2 days   GOSRANI,NIMISH C   12/08/2013, 8:25 AM

## 2013-12-08 NOTE — Progress Notes (Signed)
Removed foley patient tolerated removal well. DTV 1830.

## 2013-12-09 ENCOUNTER — Inpatient Hospital Stay (HOSPITAL_COMMUNITY): Payer: Medicaid Other

## 2013-12-09 LAB — BASIC METABOLIC PANEL
BUN: 39 mg/dL — ABNORMAL HIGH (ref 6–23)
CO2: 26 meq/L (ref 19–32)
Calcium: 8.8 mg/dL (ref 8.4–10.5)
Chloride: 100 mEq/L (ref 96–112)
Creatinine, Ser: 2.47 mg/dL — ABNORMAL HIGH (ref 0.50–1.10)
GFR calc Af Amer: 24 mL/min — ABNORMAL LOW (ref >90)
GFR, EST NON AFRICAN AMERICAN: 21 mL/min — AB (ref >90)
Glucose, Bld: 89 mg/dL (ref 70–99)
POTASSIUM: 5.8 meq/L — AB (ref 3.7–5.3)
SODIUM: 135 meq/L — AB (ref 137–147)

## 2013-12-09 LAB — GLUCOSE, CAPILLARY
GLUCOSE-CAPILLARY: 112 mg/dL — AB (ref 70–99)
GLUCOSE-CAPILLARY: 109 mg/dL — AB (ref 70–99)
Glucose-Capillary: 109 mg/dL — ABNORMAL HIGH (ref 70–99)

## 2013-12-09 LAB — PRO B NATRIURETIC PEPTIDE: PRO B NATRI PEPTIDE: 275.1 pg/mL — AB (ref 0–125)

## 2013-12-09 MED ORDER — SODIUM CHLORIDE 0.9 % IV SOLN: 250.0000 mL | INTRAVENOUS | Status: DC | PRN

## 2013-12-09 MED ORDER — FUROSEMIDE 10 MG/ML IJ SOLN
40.0000 mg | Freq: Once | INTRAMUSCULAR | Status: AC
  Administered 2013-12-09: 40 mg via INTRAVENOUS
  Filled 2013-12-09: qty 4

## 2013-12-09 MED ORDER — SODIUM CHLORIDE 0.9 % IV SOLN
250.0000 mL | INTRAVENOUS | Status: DC | PRN
  Administered 2013-12-12: 250 mL via INTRAVENOUS

## 2013-12-09 MED ORDER — SODIUM CHLORIDE 0.9 % IV SOLN
INTRAVENOUS | Status: DC
  Administered 2013-12-09 (×2): via INTRAVENOUS

## 2013-12-09 NOTE — Progress Notes (Signed)
PT dropped pill in floor (did not see) gave to nurse.

## 2013-12-09 NOTE — Progress Notes (Signed)
Pt had foley catheter removed at 1235 on 12/08/13 and has only voided 100cc since.  Bladder scan reveal less than 60cc but due to pt's abdominal girth I was not sure if it's accuracy.  Dr. Juanetta GoslingHawkins notified of the above and ordered new orders were received and carried out.  Nursing staff to continue to monitor.

## 2013-12-09 NOTE — Consult Note (Addendum)
Reason for Consult: Acute kidney injury Referring Physician: Dr. Maryann Conners is an 58 y.o. female.  HPI: She is a patient who has history of diabetes, COPD, morbid obesity presently came was difficulty in breathing and started on diuretics. Consult is called because of poor urine output and worsening of her renal failure. Patient denies any previous history of renal failure and a history of kidney stone. Presently she says that she's feeling much better he was oh she's not eating that much amount of urine. Patient says that she has swelling of the legs for some time and she has been taking diuretics on and off for some time. Presently she denies any orthopnea or paroxysmal nocturnal dyspnea.  Past Medical History  Diagnosis Date  . COPD (chronic obstructive pulmonary disease)   . Diabetes mellitus without complication   . Hypertension   . Thyroid disease   . Hyperlipemia   . Neuropathy   . Depression   . Chronic pain   . Anxiety   . CHF (congestive heart failure)     Past Surgical History  Procedure Laterality Date  . Cholecystectomy      History reviewed. No pertinent family history.  Social History:  reports that she has quit smoking. She does not have any smokeless tobacco history on file. She reports that she does not drink alcohol or use illicit drugs.  Allergies:  Allergies  Allergen Reactions  . Codeine Nausea And Vomiting  . Keflex [Cephalexin] Hives and Nausea And Vomiting  . Penicillins Nausea And Vomiting    Medications: I have reviewed the patient's current medications.  Results for orders placed during the hospital encounter of 12/06/13 (from the past 48 hour(s))  GLUCOSE, CAPILLARY     Status: Abnormal   Collection Time    12/07/13 11:49 AM      Result Value Ref Range   Glucose-Capillary 119 (*) 70 - 99 mg/dL  GLUCOSE, CAPILLARY     Status: Abnormal   Collection Time    12/07/13  4:24 PM      Result Value Ref Range   Glucose-Capillary 108  (*) 70 - 99 mg/dL   Comment 1 Documented in Chart     Comment 2 Notify RN    GLUCOSE, CAPILLARY     Status: Abnormal   Collection Time    12/07/13 10:05 PM      Result Value Ref Range   Glucose-Capillary 194 (*) 70 - 99 mg/dL   Comment 1 Documented in Chart     Comment 2 Notify RN    CBC     Status: Abnormal   Collection Time    12/08/13  6:01 AM      Result Value Ref Range   WBC 6.8  4.0 - 10.5 K/uL   RBC 2.89 (*) 3.87 - 5.11 MIL/uL   Hemoglobin 8.8 (*) 12.0 - 15.0 g/dL   HCT 27.9 (*) 36.0 - 46.0 %   MCV 96.5  78.0 - 100.0 fL   MCH 30.4  26.0 - 34.0 pg   MCHC 31.5  30.0 - 36.0 g/dL   RDW 17.2 (*) 11.5 - 15.5 %   Platelets 146 (*) 150 - 400 K/uL  BASIC METABOLIC PANEL     Status: Abnormal   Collection Time    12/08/13  6:01 AM      Result Value Ref Range   Sodium 138  137 - 147 mEq/L   Potassium 6.1 (*) 3.7 - 5.3 mEq/L   Comment:  DELTA CHECK NOTED   Chloride 102  96 - 112 mEq/L   CO2 27  19 - 32 mEq/L   Glucose, Bld 98  70 - 99 mg/dL   BUN 30 (*) 6 - 23 mg/dL   Creatinine, Ser 1.77 (*) 0.50 - 1.10 mg/dL   Calcium 9.0  8.4 - 10.5 mg/dL   GFR calc non Af Amer 31 (*) >90 mL/min   GFR calc Af Amer 36 (*) >90 mL/min   Comment: (NOTE)     The eGFR has been calculated using the CKD EPI equation.     This calculation has not been validated in all clinical situations.     eGFR's persistently <90 mL/min signify possible Chronic Kidney     Disease.  GLUCOSE, CAPILLARY     Status: None   Collection Time    12/08/13  7:48 AM      Result Value Ref Range   Glucose-Capillary 98  70 - 99 mg/dL   Comment 1 Notify RN    GLUCOSE, CAPILLARY     Status: None   Collection Time    12/08/13 11:56 AM      Result Value Ref Range   Glucose-Capillary 97  70 - 99 mg/dL   Comment 1 Notify RN    GLUCOSE, CAPILLARY     Status: Abnormal   Collection Time    12/08/13  4:33 PM      Result Value Ref Range   Glucose-Capillary 140 (*) 70 - 99 mg/dL   Comment 1 Notify RN     Comment 2 Documented  in Chart    GLUCOSE, CAPILLARY     Status: None   Collection Time    12/08/13  9:16 PM      Result Value Ref Range   Glucose-Capillary 99  70 - 99 mg/dL   Comment 1 Notify RN     Comment 2 Documented in Chart    BASIC METABOLIC PANEL     Status: Abnormal   Collection Time    12/09/13  5:47 AM      Result Value Ref Range   Sodium 135 (*) 137 - 147 mEq/L   Potassium 5.8 (*) 3.7 - 5.3 mEq/L   Chloride 100  96 - 112 mEq/L   CO2 26  19 - 32 mEq/L   Glucose, Bld 89  70 - 99 mg/dL   BUN 39 (*) 6 - 23 mg/dL   Creatinine, Ser 2.47 (*) 0.50 - 1.10 mg/dL   Calcium 8.8  8.4 - 10.5 mg/dL   GFR calc non Af Amer 21 (*) >90 mL/min   GFR calc Af Amer 24 (*) >90 mL/min   Comment: (NOTE)     The eGFR has been calculated using the CKD EPI equation.     This calculation has not been validated in all clinical situations.     eGFR's persistently <90 mL/min signify possible Chronic Kidney     Disease.  PRO B NATRIURETIC PEPTIDE     Status: Abnormal   Collection Time    12/09/13  5:52 AM      Result Value Ref Range   Pro B Natriuretic peptide (BNP) 275.1 (*) 0 - 125 pg/mL  GLUCOSE, CAPILLARY     Status: Abnormal   Collection Time    12/09/13  7:39 AM      Result Value Ref Range   Glucose-Capillary 112 (*) 70 - 99 mg/dL   Comment 1 Notify RN      Dg Knee  Right Port  12/08/2013   CLINICAL DATA:  Right knee pain.  EXAM: PORTABLE RIGHT KNEE - 1-2 VIEW  COMPARISON:  None.  FINDINGS: Mild tricompartment degenerative changes with joint space narrowing and spurring, most pronounced in the patellofemoral compartment. No acute bony abnormality. Specifically, no fracture, subluxation, or dislocation. Soft tissues are intact. No joint effusion.  IMPRESSION: Mild tricompartment degenerative changes. No acute bony abnormality.   Electronically Signed   By: Rolm Baptise M.D.   On: 12/08/2013 15:16    Review of Systems  Constitutional: Negative for fever and chills.  HENT: Positive for congestion.    Respiratory: Positive for cough and shortness of breath. Negative for sputum production.   Cardiovascular: Positive for leg swelling.  Gastrointestinal: Negative for nausea and vomiting.  Musculoskeletal: Positive for joint pain.  Neurological: Positive for weakness.  Psychiatric/Behavioral: Positive for depression.   Blood pressure 95/45, pulse 87, temperature 97.7 F (36.5 C), temperature source Oral, resp. rate 20, height _0  (1.676 m), weight 175 kg (385 lb 12.9 oz), SpO2 98.00%. Physical Exam  Constitutional: She is oriented to person, place, and time. No distress.  Eyes: No scleral icterus.  Neck: No JVD present.  Cardiovascular: Normal rate and regular rhythm.   Respiratory: She has no wheezes. She has no rales.  GI: There is no tenderness.  Musculoskeletal: She exhibits edema.  Neurological: She is alert and oriented to person, place, and time.    Assessment/Plan: Problem #1 acute kidney injury: Presently her BUN and creatinine is getting worse and her urine output is declining. This could secondary prerenal syndrome associated with diuretic use.  Problem #2 hyperkalemia: Possibly associated to outpatient potassium supplement/high potassium intake associated with worsening of renal failure. Problem #3 difficulty in breathing: This seems to be recurrent issue. The etiology could be CHF/COPD/possible sleep apnea. Presently seems to be feeling better he was off her urine output is very low. Patient is on oxygen. Problem #4 morbid obesity Problem #5 leg edema: Chronic possibly related to her obesity/pulmonary hypertension. According to the patient the swelling seems to be looking better. 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 Her creatinine on 01/04/2013 was 0.91 and her EGFR was 70 cc per minute hence stage II chronic renal failure. This could be secondary to  diabetes/hypertension/ischemic. Plan: Agree with starting on hydration. We'll check her UA Will do ultrasound of the kidneys We'll check iron studies We'll put patient on potassium diet We'll continue with diuretics to help [place] control her potassium.  BEFEKADU,BELAYENH S 12/09/2013, 9:06 AM

## 2013-12-09 NOTE — Progress Notes (Signed)
Subjective: She says she feels better than before. This is my first time seeing her. She had poor urine output last night and had Foley catheter placed but still had poor urine output. She has been treated for congestive heart failure and was noted to still have significant pretibial edema and was given a dose of IV Lasix. Her basic metabolic profile was ordered and when it returned it showed that she had worsening renal failure.  Objective: Vital signs in last 24 hours: Temp:  [97.6 F (36.4 C)-98.3 F (36.8 C)] 97.7 F (36.5 C) (06/27 0509) Pulse Rate:  [87-97] 87 (06/27 0509) Resp:  [20] 20 (06/26 2134) BP: (87-95)/(45-56) 95/45 mmHg (06/27 0509) SpO2:  [95 %-100 %] 98 % (06/27 0649) Weight change:  Last BM Date: 12/07/13  Intake/Output from previous day: 06/26 0701 - 06/27 0700 In: 368 [P.O.:365; I.V.:3] Out: 50 [Urine:50]  PHYSICAL EXAM General appearance: alert, cooperative, mild distress and morbidly obese Resp: rhonchi bilaterally Cardio: regular rate and rhythm, S1, S2 normal, no murmur, click, rub or gallop GI: Soft obese with no masses Extremities: She still has 1-2+ edema of both legs and some chronic venous stasis changes  Lab Results:  Results for orders placed during the hospital encounter of 12/06/13 (from the past 48 hour(s))  GLUCOSE, CAPILLARY     Status: Abnormal   Collection Time    12/07/13 11:49 AM      Result Value Ref Range   Glucose-Capillary 119 (*) 70 - 99 mg/dL  GLUCOSE, CAPILLARY     Status: Abnormal   Collection Time    12/07/13  4:24 PM      Result Value Ref Range   Glucose-Capillary 108 (*) 70 - 99 mg/dL   Comment 1 Documented in Chart     Comment 2 Notify RN    GLUCOSE, CAPILLARY     Status: Abnormal   Collection Time    12/07/13 10:05 PM      Result Value Ref Range   Glucose-Capillary 194 (*) 70 - 99 mg/dL   Comment 1 Documented in Chart     Comment 2 Notify RN    CBC     Status: Abnormal   Collection Time    12/08/13  6:01 AM     Result Value Ref Range   WBC 6.8  4.0 - 10.5 K/uL   RBC 2.89 (*) 3.87 - 5.11 MIL/uL   Hemoglobin 8.8 (*) 12.0 - 15.0 g/dL   HCT 27.9 (*) 36.0 - 46.0 %   MCV 96.5  78.0 - 100.0 fL   MCH 30.4  26.0 - 34.0 pg   MCHC 31.5  30.0 - 36.0 g/dL   RDW 17.2 (*) 11.5 - 15.5 %   Platelets 146 (*) 150 - 400 K/uL  BASIC METABOLIC PANEL     Status: Abnormal   Collection Time    12/08/13  6:01 AM      Result Value Ref Range   Sodium 138  137 - 147 mEq/L   Potassium 6.1 (*) 3.7 - 5.3 mEq/L   Comment: DELTA CHECK NOTED   Chloride 102  96 - 112 mEq/L   CO2 27  19 - 32 mEq/L   Glucose, Bld 98  70 - 99 mg/dL   BUN 30 (*) 6 - 23 mg/dL   Creatinine, Ser 1.77 (*) 0.50 - 1.10 mg/dL   Calcium 9.0  8.4 - 10.5 mg/dL   GFR calc non Af Amer 31 (*) >90 mL/min   GFR calc Af  Amer 36 (*) >90 mL/min   Comment: (NOTE)     The eGFR has been calculated using the CKD EPI equation.     This calculation has not been validated in all clinical situations.     eGFR's persistently <90 mL/min signify possible Chronic Kidney     Disease.  GLUCOSE, CAPILLARY     Status: None   Collection Time    12/08/13  7:48 AM      Result Value Ref Range   Glucose-Capillary 98  70 - 99 mg/dL   Comment 1 Notify RN    GLUCOSE, CAPILLARY     Status: None   Collection Time    12/08/13 11:56 AM      Result Value Ref Range   Glucose-Capillary 97  70 - 99 mg/dL   Comment 1 Notify RN    GLUCOSE, CAPILLARY     Status: Abnormal   Collection Time    12/08/13  4:33 PM      Result Value Ref Range   Glucose-Capillary 140 (*) 70 - 99 mg/dL   Comment 1 Notify RN     Comment 2 Documented in Chart    GLUCOSE, CAPILLARY     Status: None   Collection Time    12/08/13  9:16 PM      Result Value Ref Range   Glucose-Capillary 99  70 - 99 mg/dL   Comment 1 Notify RN     Comment 2 Documented in Chart    BASIC METABOLIC PANEL     Status: Abnormal   Collection Time    12/09/13  5:47 AM      Result Value Ref Range   Sodium 135 (*) 137 - 147  mEq/L   Potassium 5.8 (*) 3.7 - 5.3 mEq/L   Chloride 100  96 - 112 mEq/L   CO2 26  19 - 32 mEq/L   Glucose, Bld 89  70 - 99 mg/dL   BUN 39 (*) 6 - 23 mg/dL   Creatinine, Ser 2.47 (*) 0.50 - 1.10 mg/dL   Calcium 8.8  8.4 - 10.5 mg/dL   GFR calc non Af Amer 21 (*) >90 mL/min   GFR calc Af Amer 24 (*) >90 mL/min   Comment: (NOTE)     The eGFR has been calculated using the CKD EPI equation.     This calculation has not been validated in all clinical situations.     eGFR's persistently <90 mL/min signify possible Chronic Kidney     Disease.  PRO B NATRIURETIC PEPTIDE     Status: Abnormal   Collection Time    12/09/13  5:52 AM      Result Value Ref Range   Pro B Natriuretic peptide (BNP) 275.1 (*) 0 - 125 pg/mL  GLUCOSE, CAPILLARY     Status: Abnormal   Collection Time    12/09/13  7:39 AM      Result Value Ref Range   Glucose-Capillary 112 (*) 70 - 99 mg/dL   Comment 1 Notify RN      ABGS No results found for this basename: PHART, PCO2, PO2ART, TCO2, HCO3,  in the last 72 hours CULTURES No results found for this or any previous visit (from the past 240 hour(s)). Studies/Results: Dg Knee Right Port  12/08/2013   CLINICAL DATA:  Right knee pain.  EXAM: PORTABLE RIGHT KNEE - 1-2 VIEW  COMPARISON:  None.  FINDINGS: Mild tricompartment degenerative changes with joint space narrowing and spurring, most pronounced in the patellofemoral  compartment. No acute bony abnormality. Specifically, no fracture, subluxation, or dislocation. Soft tissues are intact. No joint effusion.  IMPRESSION: Mild tricompartment degenerative changes. No acute bony abnormality.   Electronically Signed   By: Rolm Baptise M.D.   On: 12/08/2013 15:16    Medications:  Prior to Admission:  Prescriptions prior to admission  Medication Sig Dispense Refill  . clonazePAM (KLONOPIN) 1 MG tablet Take 1 mg by mouth 4 (four) times daily as needed for anxiety.      . DULoxetine (CYMBALTA) 20 MG capsule Take 1 capsule (20 mg  total) by mouth daily.  30 capsule  3  . furosemide (LASIX) 40 MG tablet Take 1 tablet (40 mg total) by mouth daily.  30 tablet  1  . HYDROcodone-acetaminophen (NORCO/VICODIN) 5-325 MG per tablet Take 1 tablet by mouth every 4 (four) hours as needed for moderate pain.      Marland Kitchen levothyroxine (SYNTHROID, LEVOTHROID) 200 MCG tablet Take 200 mcg by mouth daily before breakfast.      . lisinopril (PRINIVIL,ZESTRIL) 5 MG tablet Take 1 tablet (5 mg total) by mouth daily.  30 tablet  1  . PARoxetine (PAXIL) 20 MG tablet Take 20 mg by mouth every morning.      . potassium chloride SA (K-DUR,KLOR-CON) 20 MEQ tablet Take 40 mEq by mouth daily.      . promethazine (PHENERGAN) 25 MG tablet Take 25 mg by mouth every 6 (six) hours as needed for nausea or vomiting.      . simvastatin (ZOCOR) 40 MG tablet Take 40 mg by mouth every evening.      . temazepam (RESTORIL) 15 MG capsule Take 15 mg by mouth at bedtime as needed for sleep.      . insulin lispro (HUMALOG) 100 UNIT/ML injection Inject 2-10 Units into the skin 3 (three) times daily before meals. SS       Scheduled: . antiseptic oral rinse  15 mL Mouth Rinse BID  . aspirin EC  81 mg Oral Daily  . docusate sodium  100 mg Oral BID  . DULoxetine  20 mg Oral Daily  . folic acid  1 mg Oral Daily  . heparin  5,000 Units Subcutaneous 3 times per day  . insulin aspart  0-20 Units Subcutaneous TID WC  . insulin aspart  4 Units Subcutaneous TID WC  . ipratropium-albuterol  3 mL Nebulization Q6H  . levothyroxine  200 mcg Oral QAC breakfast  . multivitamin with minerals  1 tablet Oral Daily  . PARoxetine  20 mg Oral BH-q7a  . simvastatin  40 mg Oral QPM  . sodium chloride  3 mL Intravenous Q12H  . sodium chloride  3 mL Intravenous Q12H  . thiamine  100 mg Oral Daily   Continuous:  TKP:TWSFKC chloride, acetaminophen, acetaminophen, alum & mag hydroxide-simeth, bisacodyl, chlorpheniramine-HYDROcodone, clonazePAM, HYDROcodone-acetaminophen, morphine injection,  ondansetron (ZOFRAN) IV, ondansetron, polyethylene glycol, promethazine, sodium chloride, temazepam  Assesment: She was admitted with congestive heart failure and has been diuresed. She has some element of chronic kidney disease which is significantly worse. She has diabetes which is stable. She has morbid obesity which is unchanged. She still has edema of her lower extremities. She says her breathing is better. She does have some element of COPD as well Active Problems:   Obesity, unspecified   Type II or unspecified type diabetes mellitus without mention of complication, uncontrolled   Anemia   Unspecified hypothyroidism   Acute CHF (congestive heart failure)   CHF (congestive  heart failure)    Plan: Nephrology consultation.    LOS: 3 days   Delontae Lamm L 12/09/2013, 9:58 AM

## 2013-12-10 LAB — CBC
HEMATOCRIT: 27.2 % — AB (ref 36.0–46.0)
Hemoglobin: 8.7 g/dL — ABNORMAL LOW (ref 12.0–15.0)
MCH: 30.6 pg (ref 26.0–34.0)
MCHC: 32 g/dL (ref 30.0–36.0)
MCV: 95.8 fL (ref 78.0–100.0)
PLATELETS: 151 10*3/uL (ref 150–400)
RBC: 2.84 MIL/uL — ABNORMAL LOW (ref 3.87–5.11)
RDW: 17.3 % — AB (ref 11.5–15.5)
WBC: 6.6 10*3/uL (ref 4.0–10.5)

## 2013-12-10 LAB — BASIC METABOLIC PANEL
BUN: 48 mg/dL — ABNORMAL HIGH (ref 6–23)
CHLORIDE: 98 meq/L (ref 96–112)
CO2: 27 meq/L (ref 19–32)
CREATININE: 2.34 mg/dL — AB (ref 0.50–1.10)
Calcium: 8.5 mg/dL (ref 8.4–10.5)
GFR calc Af Amer: 26 mL/min — ABNORMAL LOW (ref >90)
GFR calc non Af Amer: 22 mL/min — ABNORMAL LOW (ref >90)
GLUCOSE: 94 mg/dL (ref 70–99)
Potassium: 6 mEq/L — ABNORMAL HIGH (ref 3.7–5.3)
Sodium: 132 mEq/L — ABNORMAL LOW (ref 137–147)

## 2013-12-10 LAB — URINALYSIS, ROUTINE W REFLEX MICROSCOPIC
BILIRUBIN URINE: NEGATIVE
Glucose, UA: NEGATIVE mg/dL
Ketones, ur: NEGATIVE mg/dL
Nitrite: POSITIVE — AB
Protein, ur: NEGATIVE mg/dL
Specific Gravity, Urine: >1.03 — ABNORMAL HIGH (ref 1.005–1.030)
Urobilinogen, UA: 0.2 mg/dL (ref 0.0–1.0)
pH: 5 (ref 5.0–8.0)

## 2013-12-10 LAB — URINE MICROSCOPIC-ADD ON

## 2013-12-10 LAB — GLUCOSE, CAPILLARY
GLUCOSE-CAPILLARY: 106 mg/dL — AB (ref 70–99)
Glucose-Capillary: 129 mg/dL — ABNORMAL HIGH (ref 70–99)
Glucose-Capillary: 83 mg/dL (ref 70–99)
Glucose-Capillary: 98 mg/dL (ref 70–99)

## 2013-12-10 LAB — IRON AND TIBC
IRON: 106 ug/dL (ref 42–135)
Saturation Ratios: 36 % (ref 20–55)
TIBC: 293 ug/dL (ref 250–470)
UIBC: 187 ug/dL (ref 125–400)

## 2013-12-10 LAB — HEPATITIS C ANTIBODY: HCV Ab: NEGATIVE

## 2013-12-10 LAB — PHOSPHORUS: Phosphorus: 6.5 mg/dL — ABNORMAL HIGH (ref 2.3–4.6)

## 2013-12-10 LAB — AMMONIA: Ammonia: 55 umol/L (ref 11–60)

## 2013-12-10 LAB — FERRITIN: Ferritin: 80 ng/mL (ref 10–291)

## 2013-12-10 LAB — HEPATITIS B SURFACE ANTIGEN: HEP B S AG: NEGATIVE

## 2013-12-10 MED ORDER — SODIUM POLYSTYRENE SULFONATE 15 GM/60ML PO SUSP
60.0000 g | Freq: Once | ORAL | Status: AC
  Administered 2013-12-10: 60 g via ORAL
  Filled 2013-12-10: qty 240

## 2013-12-10 MED ORDER — DEXTROSE 5 % IV SOLN
100.0000 mg | Freq: Two times a day (BID) | INTRAVENOUS | Status: DC
  Administered 2013-12-10 (×2): 100 mg via INTRAVENOUS
  Filled 2013-12-10 (×3): qty 10

## 2013-12-10 NOTE — Progress Notes (Deleted)
Subjective: Interval History: has no complaint of difficulty in breathing. Patient denies any nausea vomiting. Presently she doesn't have any cough..  Objective: Vital signs in last 24 hours: Temp:  [97.4 F (36.3 C)-97.8 F (36.6 C)] 97.8 F (36.6 C) (06/28 0612) Pulse Rate:  [85-95] 85 (06/28 0612) Resp:  [20] 20 (06/28 0612) BP: (89-96)/(33-60) 96/60 mmHg (06/28 0612) SpO2:  [98 %-100 %] 98 % (06/28 0612) Weight change:   Intake/Output from previous day: 06/27 0701 - 06/28 0700 In: 2250 [P.O.:720; I.V.:1530] Out: 275 [Urine:275] Intake/Output this shift:    General appearance: alert, cooperative and no distress Resp: diminished breath sounds posterior - bilateral Cardio: regular rate and rhythm, S1, S2 normal, no murmur, click, rub or gallop GI: Obese, nontender  Extremities: edema 2+ edema bilaterally  Lab Results:  Recent Labs  12/08/13 0601  WBC 6.8  HGB 8.8*  HCT 27.9*  PLT 146*   BMET:  Recent Labs  12/08/13 0601 12/09/13 0547  NA 138 135*  K 6.1* 5.8*  CL 102 100  CO2 27 26  GLUCOSE 98 89  BUN 30* 39*  CREATININE 1.77* 2.47*  CALCIUM 9.0 8.8   No results found for this basename: PTH,  in the last 72 hours Iron Studies: No results found for this basename: IRON, TIBC, TRANSFERRIN, FERRITIN,  in the last 72 hours  Studies/Results: [place] Renal  12/09/2013   CLINICAL DATA:  Elevated creatinine.  EXAM: RENAL/URINARY TRACT ULTRASOUND COMPLETE  COMPARISON:  None.  FINDINGS: Right Kidney:  The right kidney cannot be visualized.  Left Kidney:  The left kidney cannot be visualized.  Bladder:  Decompressed by Foley catheter.  IMPRESSION: The kidneys cannot be visualized. CT can be performed to further delineate if desired.   Electronically Signed   By: Maryclare BeanArt  Hoss M.D.   On: 12/09/2013 12:44   Dg Knee Right Port  12/08/2013   CLINICAL DATA:  Right knee pain.  EXAM: PORTABLE RIGHT KNEE - 1-2 VIEW  COMPARISON:  None.  FINDINGS: Mild tricompartment degenerative  changes with joint space narrowing and spurring, most pronounced in the patellofemoral compartment. No acute bony abnormality. Specifically, no fracture, subluxation, or dislocation. Soft tissues are intact. No joint effusion.  IMPRESSION: Mild tricompartment degenerative changes. No acute bony abnormality.   Electronically Signed   By: Charlett NoseKevin  Dover M.D.   On: 12/08/2013 15:16    I have reviewed the patient's current medications.  Assessment/Plan: Problem #1 acute kidney injury: Possibly prerenal versus ATN. Patient presently oliguric. Ultrasound of the kidneys was done yesterday to not able visualized the kidneys. Presently blood work is not done hence her  kidney function is improving or worsening. Problem #2 hyperkalemia: It was a combination of potassium supplement/ACE inhibitor and worsening of renal failure. Presently potassium is pending. Patient to get in and supplement or ACE inhibitor. Problem #3 difficulty in breathing: Presently she is feeling better. This could be a combination of COPD/sleep apnea/CHF. Problem #4 morbid obesity Problem #5 history of hypothyroidism she is on Synthroid Problem #6 diabetes Problem #7 chronic lower extremity edema Plan: Continue his hydration Will start patient on Lasix 100 mg IV twice a day We'll check her basic metabolic panel and CBC in the morning Possibly we will do CT scan of the abdomen without contrast. We'll check her basic metabolic panel when it is done today.    LOS: 4 days   BEFEKADU,BELAYENH S 12/10/2013,8:33 AM

## 2013-12-10 NOTE — Progress Notes (Signed)
Subjective: She seems about the same. Her sister who is present with her says that she's been somewhat confused. In the past she has had some problem with ammonia level so I'm going to go ahead and recheck that. Her renal function is somewhat better but she still has an elevated potassium  Objective: Vital signs in last 24 hours: Temp:  [97.4 F (36.3 C)-97.8 F (36.6 C)] 97.8 F (36.6 C) (06/28 0612) Pulse Rate:  [85-95] 85 (06/28 0612) Resp:  [20] 20 (06/28 0612) BP: (89-96)/(33-60) 96/60 mmHg (06/28 0612) SpO2:  [98 %-100 %] 100 % (06/28 0830) Weight change:  Last BM Date: 12/09/13  Intake/Output from previous day: 06/27 0701 - 06/28 0700 In: 2250 [P.O.:720; I.V.:1530] Out: 275 [Urine:275]  PHYSICAL EXAM General appearance: alert, cooperative, mild distress and morbidly obese Resp: clear to auscultation bilaterally Cardio: regular rate and rhythm, S1, S2 normal, no murmur, click, rub or gallop GI: soft, non-tender; bowel sounds normal; no masses,  no organomegaly Extremities: 2+ edema  Lab Results:  Results for orders placed during the hospital encounter of 12/06/13 (from the past 48 hour(s))  GLUCOSE, CAPILLARY     Status: None   Collection Time    12/08/13 11:56 AM      Result Value Ref Range   Glucose-Capillary 97  70 - 99 mg/dL   Comment 1 Notify RN    GLUCOSE, CAPILLARY     Status: Abnormal   Collection Time    12/08/13  4:33 PM      Result Value Ref Range   Glucose-Capillary 140 (*) 70 - 99 mg/dL   Comment 1 Notify RN     Comment 2 Documented in Chart    GLUCOSE, CAPILLARY     Status: None   Collection Time    12/08/13  9:16 PM      Result Value Ref Range   Glucose-Capillary 99  70 - 99 mg/dL   Comment 1 Notify RN     Comment 2 Documented in Chart    BASIC METABOLIC PANEL     Status: Abnormal   Collection Time    12/09/13  5:47 AM      Result Value Ref Range   Sodium 135 (*) 137 - 147 mEq/L   Potassium 5.8 (*) 3.7 - 5.3 mEq/L   Chloride 100  96 - 112  mEq/L   CO2 26  19 - 32 mEq/L   Glucose, Bld 89  70 - 99 mg/dL   BUN 39 (*) 6 - 23 mg/dL   Creatinine, Ser 2.47 (*) 0.50 - 1.10 mg/dL   Calcium 8.8  8.4 - 10.5 mg/dL   GFR calc non Af Amer 21 (*) >90 mL/min   GFR calc Af Amer 24 (*) >90 mL/min   Comment: (NOTE)     The eGFR has been calculated using the CKD EPI equation.     This calculation has not been validated in all clinical situations.     eGFR's persistently <90 mL/min signify possible Chronic Kidney     Disease.  PRO B NATRIURETIC PEPTIDE     Status: Abnormal   Collection Time    12/09/13  5:52 AM      Result Value Ref Range   Pro B Natriuretic peptide (BNP) 275.1 (*) 0 - 125 pg/mL  GLUCOSE, CAPILLARY     Status: Abnormal   Collection Time    12/09/13  7:39 AM      Result Value Ref Range   Glucose-Capillary 112 (*) 70 -  99 mg/dL   Comment 1 Notify RN    GLUCOSE, CAPILLARY     Status: Abnormal   Collection Time    12/09/13 11:43 AM      Result Value Ref Range   Glucose-Capillary 109 (*) 70 - 99 mg/dL   Comment 1 Notify RN    GLUCOSE, CAPILLARY     Status: Abnormal   Collection Time    12/09/13  4:57 PM      Result Value Ref Range   Glucose-Capillary 109 (*) 70 - 99 mg/dL   Comment 1 Notify RN    GLUCOSE, CAPILLARY     Status: Abnormal   Collection Time    12/09/13  9:00 PM      Result Value Ref Range   Glucose-Capillary 129 (*) 70 - 99 mg/dL   Comment 1 Notify RN    GLUCOSE, CAPILLARY     Status: None   Collection Time    12/10/13  8:01 AM      Result Value Ref Range   Glucose-Capillary 83  70 - 99 mg/dL  PHOSPHORUS     Status: Abnormal   Collection Time    12/10/13  8:57 AM      Result Value Ref Range   Phosphorus 6.5 (*) 2.3 - 4.6 mg/dL  BASIC METABOLIC PANEL     Status: Abnormal   Collection Time    12/10/13  8:57 AM      Result Value Ref Range   Sodium 132 (*) 137 - 147 mEq/L   Potassium 6.0 (*) 3.7 - 5.3 mEq/L   Chloride 98  96 - 112 mEq/L   CO2 27  19 - 32 mEq/L   Glucose, Bld 94  70 - 99 mg/dL    BUN 48 (*) 6 - 23 mg/dL   Creatinine, Ser 2.34 (*) 0.50 - 1.10 mg/dL   Calcium 8.5  8.4 - 10.5 mg/dL   GFR calc non Af Amer 22 (*) >90 mL/min   GFR calc Af Amer 26 (*) >90 mL/min   Comment: (NOTE)     The eGFR has been calculated using the CKD EPI equation.     This calculation has not been validated in all clinical situations.     eGFR's persistently <90 mL/min signify possible Chronic Kidney     Disease.  CBC     Status: Abnormal   Collection Time    12/10/13  8:57 AM      Result Value Ref Range   WBC 6.6  4.0 - 10.5 K/uL   RBC 2.84 (*) 3.87 - 5.11 MIL/uL   Hemoglobin 8.7 (*) 12.0 - 15.0 g/dL   HCT 27.2 (*) 36.0 - 46.0 %   MCV 95.8  78.0 - 100.0 fL   MCH 30.6  26.0 - 34.0 pg   MCHC 32.0  30.0 - 36.0 g/dL   RDW 17.3 (*) 11.5 - 15.5 %   Platelets 151  150 - 400 K/uL  AMMONIA     Status: None   Collection Time    12/10/13  8:57 AM      Result Value Ref Range   Ammonia 55  11 - 60 umol/L    ABGS No results found for this basename: PHART, PCO2, PO2ART, TCO2, HCO3,  in the last 72 hours CULTURES No results found for this or any previous visit (from the past 240 hour(s)). Studies/Results: US Renal  12/09/2013   CLINICAL DATA:  Elevated creatinine.  EXAM: RENAL/URINARY TRACT ULTRASOUND COMPLETE  COMPARISON:  None.  FINDINGS: Right Kidney:  The right kidney cannot be visualized.  Left Kidney:  The left kidney cannot be visualized.  Bladder:  Decompressed by Foley catheter.  IMPRESSION: The kidneys cannot be visualized. CT can be performed to further delineate if desired.   Electronically Signed   By: Maryclare Bean M.D.   On: 12/09/2013 12:44   Dg Knee Right Port  12/08/2013   CLINICAL DATA:  Right knee pain.  EXAM: PORTABLE RIGHT KNEE - 1-2 VIEW  COMPARISON:  None.  FINDINGS: Mild tricompartment degenerative changes with joint space narrowing and spurring, most pronounced in the patellofemoral compartment. No acute bony abnormality. Specifically, no fracture, subluxation, or  dislocation. Soft tissues are intact. No joint effusion.  IMPRESSION: Mild tricompartment degenerative changes. No acute bony abnormality.   Electronically Signed   By: Rolm Baptise M.D.   On: 12/08/2013 15:16    Medications:  Prior to Admission:  Prescriptions prior to admission  Medication Sig Dispense Refill  . clonazePAM (KLONOPIN) 1 MG tablet Take 1 mg by mouth 4 (four) times daily as needed for anxiety.      . DULoxetine (CYMBALTA) 20 MG capsule Take 1 capsule (20 mg total) by mouth daily.  30 capsule  3  . furosemide (LASIX) 40 MG tablet Take 1 tablet (40 mg total) by mouth daily.  30 tablet  1  . HYDROcodone-acetaminophen (NORCO/VICODIN) 5-325 MG per tablet Take 1 tablet by mouth every 4 (four) hours as needed for moderate pain.      Marland Kitchen levothyroxine (SYNTHROID, LEVOTHROID) 200 MCG tablet Take 200 mcg by mouth daily before breakfast.      . lisinopril (PRINIVIL,ZESTRIL) 5 MG tablet Take 1 tablet (5 mg total) by mouth daily.  30 tablet  1  . PARoxetine (PAXIL) 20 MG tablet Take 20 mg by mouth every morning.      . potassium chloride SA (K-DUR,KLOR-CON) 20 MEQ tablet Take 40 mEq by mouth daily.      . promethazine (PHENERGAN) 25 MG tablet Take 25 mg by mouth every 6 (six) hours as needed for nausea or vomiting.      . simvastatin (ZOCOR) 40 MG tablet Take 40 mg by mouth every evening.      . temazepam (RESTORIL) 15 MG capsule Take 15 mg by mouth at bedtime as needed for sleep.      . insulin lispro (HUMALOG) 100 UNIT/ML injection Inject 2-10 Units into the skin 3 (three) times daily before meals. SS       Scheduled: . antiseptic oral rinse  15 mL Mouth Rinse BID  . aspirin EC  81 mg Oral Daily  . docusate sodium  100 mg Oral BID  . DULoxetine  20 mg Oral Daily  . folic acid  1 mg Oral Daily  . furosemide  100 mg Intravenous BID  . heparin  5,000 Units Subcutaneous 3 times per day  . insulin aspart  0-20 Units Subcutaneous TID WC  . insulin aspart  4 Units Subcutaneous TID WC  .  ipratropium-albuterol  3 mL Nebulization Q6H  . levothyroxine  200 mcg Oral QAC breakfast  . multivitamin with minerals  1 tablet Oral Daily  . PARoxetine  20 mg Oral BH-q7a  . simvastatin  40 mg Oral QPM  . sodium chloride  3 mL Intravenous Q12H  . sodium chloride  3 mL Intravenous Q12H  . thiamine  100 mg Oral Daily   Continuous:  SAY:TKZSWF chloride, acetaminophen, acetaminophen, alum & mag hydroxide-simeth, bisacodyl,  chlorpheniramine-HYDROcodone, clonazePAM, HYDROcodone-acetaminophen, morphine injection, ondansetron (ZOFRAN) IV, ondansetron, polyethylene glycol, promethazine, sodium chloride, temazepam  Assesment: She has acute kidney injury. Her renal function is somewhat better. She was admitted with edema of the extremities and I think some of that may be related to her low albumin level. Her echocardiogram shows normal systolic function. According to family she somewhat confused and has had trouble with ammonia level in the past so that will be reevaluated. She is hyperkalemic. Active Problems:   Obesity, unspecified   Type II or unspecified type diabetes mellitus without mention of complication, uncontrolled   Anemia   Unspecified hypothyroidism   Acute CHF (congestive heart failure)   CHF (congestive heart failure)    Plan: Continue current treatments. Treat her potassium. Recheck labs in the morning.    LOS: 4 days   HAWKINS,EDWARD L 12/10/2013, 10:14 AM

## 2013-12-10 NOTE — Progress Notes (Signed)
Subjective: Interval History: has no complaint of difficulty in breathing. Patient denies any nausea vomiting. Presently she doesn't have any cough..  Objective: Vital signs in last 24 hours: Temp:  [97.4 F (36.3 C)-97.8 F (36.6 C)] 97.8 F (36.6 C) (06/28 0612) Pulse Rate:  [85-95] 85 (06/28 0612) Resp:  [20] 20 (06/28 0612) BP: (89-96)/(33-60) 96/60 mmHg (06/28 0612) SpO2:  [98 %-100 %] 100 % (06/28 0830) Weight change:   Intake/Output from previous day: 06/27 0701 - 06/28 0700 In: 2250 [P.O.:720; I.V.:1530] Out: 275 [Urine:275] Intake/Output this shift:    General appearance: alert, cooperative and no distress Resp: diminished breath sounds posterior - bilateral Cardio: regular rate and rhythm, S1, S2 normal, no murmur, click, rub or gallop GI: Obese, nontender  Extremities: edema 2+ edema bilaterally  Lab Results:  Recent Labs  12/08/13 0601  WBC 6.8  HGB 8.8*  HCT 27.9*  PLT 146*   BMET:   Recent Labs  12/08/13 0601 12/09/13 0547  NA 138 135*  K 6.1* 5.8*  CL 102 100  CO2 27 26  GLUCOSE 98 89  BUN 30* 39*  CREATININE 1.77* 2.47*  CALCIUM 9.0 8.8   No results found for this basename: PTH,  in the last 72 hours Iron Studies: No results found for this basename: IRON, TIBC, TRANSFERRIN, FERRITIN,  in the last 72 hours  Studies/Results: [facility]al  12/09/2013   CLINICAL DATA:  Elevated creatinine.  EXAM: RENAL/URINARY TRACT ULTRASOUND COMPLETE  COMPARISON:  None.  FINDINGS: Right Kidney:  The right kidney cannot be visualized.  Left Kidney:  The left kidney cannot be visualized.  Bladder:  Decompressed by Foley catheter.  IMPRESSION: The kidneys cannot be visualized. CT can be performed to further delineate if desired.   Electronically Signed   By: Maryclare BeanArt  Hoss M.D.   On: 12/09/2013 12:44   Dg Knee Right Port  12/08/2013   CLINICAL DATA:  Right knee pain.  EXAM: PORTABLE RIGHT KNEE - 1-2 VIEW  COMPARISON:  None.  FINDINGS: Mild tricompartment degenerative  changes with joint space narrowing and spurring, most pronounced in the patellofemoral compartment. No acute bony abnormality. Specifically, no fracture, subluxation, or dislocation. Soft tissues are intact. No joint effusion.  IMPRESSION: Mild tricompartment degenerative changes. No acute bony abnormality.   Electronically Signed   By: Charlett NoseKevin  Dover M.D.   On: 12/08/2013 15:16    I have reviewed the patient's current medications.  Assessment/Plan: Problem #1 acute kidney injury: Possibly prerenal versus ATN. Patient presently oliguric. Ultrasound of the kidneys was done yesterday to not able visualized the kidneys. Presently blood work is not done hence her  kidney function is improving or worsening. Problem #2 hyperkalemia: It was a combination of potassium supplement/ACE inhibitor and worsening of renal failure. Presently potassium is pending. Patient to get in and supplement or ACE inhibitor. Problem #3 difficulty in breathing: Presently she is feeling better. This could be a combination of COPD/sleep apnea/CHF. Problem #4 morbid obesity Problem #5 history of hypothyroidism she is on Synthroid Problem #6 diabetes Problem #7 chronic lower extremity edema Problem#8 Malar rash o the bridge of  her nose and her face ? No history of Lupus?  Plan: Continue his hydration We will check UA,ANA,complement,Hep B surface antigen, Hep C antibody Will start patient on Lasix 100 mg IV twice a day We'll check her basic metabolic panel and CBC in the morning Possibly we will do CT scan of the abdomen without contrast. We'll check her basic metabolic panel when  it is done today.    LOS: 4 days   BEFEKADU,BELAYENH S 12/10/2013,9:05 AM   100 and

## 2013-12-11 LAB — GLUCOSE, CAPILLARY
GLUCOSE-CAPILLARY: 125 mg/dL — AB (ref 70–99)
Glucose-Capillary: 129 mg/dL — ABNORMAL HIGH (ref 70–99)
Glucose-Capillary: 99 mg/dL (ref 70–99)
Glucose-Capillary: 107 mg/dL — ABNORMAL HIGH (ref 70–99)
Glucose-Capillary: 106 mg/dL — ABNORMAL HIGH (ref 70–99)

## 2013-12-11 LAB — COMPREHENSIVE METABOLIC PANEL
ALBUMIN: 2.5 g/dL — AB (ref 3.5–5.2)
ALK PHOS: 131 U/L — AB (ref 39–117)
ALT: 13 U/L (ref 0–35)
AST: 35 U/L (ref 0–37)
BUN: 50 mg/dL — ABNORMAL HIGH (ref 6–23)
CALCIUM: 8.4 mg/dL (ref 8.4–10.5)
CO2: 29 mEq/L (ref 19–32)
Chloride: 100 mEq/L (ref 96–112)
Creatinine, Ser: 2.07 mg/dL — ABNORMAL HIGH (ref 0.50–1.10)
GFR calc non Af Amer: 26 mL/min — ABNORMAL LOW (ref >90)
GFR, EST AFRICAN AMERICAN: 30 mL/min — AB (ref >90)
Glucose, Bld: 102 mg/dL — ABNORMAL HIGH (ref 70–99)
POTASSIUM: 5 meq/L (ref 3.7–5.3)
Sodium: 138 mEq/L (ref 137–147)
Total Bilirubin: 1.5 mg/dL — ABNORMAL HIGH (ref 0.3–1.2)
Total Protein: 6.1 g/dL (ref 6.0–8.3)

## 2013-12-11 LAB — ANTI-NUCLEAR AB-TITER (ANA TITER)

## 2013-12-11 LAB — ANA: Anti Nuclear Antibody(ANA): POSITIVE — AB

## 2013-12-11 MED ORDER — TORSEMIDE 20 MG PO TABS
20.0000 mg | ORAL_TABLET | Freq: Every day | ORAL | Status: DC
  Administered 2013-12-11 – 2013-12-14 (×3): 20 mg via ORAL
  Filled 2013-12-11 (×3): qty 1

## 2013-12-11 MED ORDER — CLONAZEPAM 0.5 MG PO TABS
0.5000 mg | ORAL_TABLET | Freq: Four times a day (QID) | ORAL | Status: DC | PRN
  Administered 2013-12-11 – 2013-12-14 (×3): 0.5 mg via ORAL
  Filled 2013-12-11 (×4): qty 1

## 2013-12-11 NOTE — Progress Notes (Signed)
Subjective: She says she feels better. Her breathing is better. She has no new complaints.  Objective: Vital signs in last 24 hours: Temp:  [97.5 F (36.4 C)-97.6 F (36.4 C)] 97.5 F (36.4 C) (06/29 0646) Pulse Rate:  [88-98] 94 (06/29 0646) Resp:  [20] 20 (06/29 0646) BP: (94-103)/(35-41) 97/39 mmHg (06/29 0646) SpO2:  [97 %-100 %] 97 % (06/29 0729) Weight change:  Last BM Date: 12/09/13  Intake/Output from previous day: 06/28 0701 - 06/29 0700 In: 420 [P.O.:360; IV Piggyback:60] Out: 2700 [Urine:2700]  PHYSICAL EXAM General appearance: alert, cooperative, mild distress and morbidly obese Resp: clear to auscultation bilaterally Cardio: regular rate and rhythm, S1, S2 normal, no murmur, click, rub or gallop GI: soft, non-tender; bowel sounds normal; no masses,  no organomegaly Extremities: extremities normal, atraumatic, no cyanosis or edema  Lab Results:  Results for orders placed during the hospital encounter of 12/06/13 (from the past 48 hour(s))  GLUCOSE, CAPILLARY     Status: Abnormal   Collection Time    12/09/13 11:43 AM      Result Value Ref Range   Glucose-Capillary 109 (*) 70 - 99 mg/dL   Comment 1 Notify RN    GLUCOSE, CAPILLARY     Status: Abnormal   Collection Time    12/09/13  4:57 PM      Result Value Ref Range   Glucose-Capillary 109 (*) 70 - 99 mg/dL   Comment 1 Notify RN    GLUCOSE, CAPILLARY     Status: Abnormal   Collection Time    12/09/13  9:00 PM      Result Value Ref Range   Glucose-Capillary 129 (*) 70 - 99 mg/dL   Comment 1 Notify RN    GLUCOSE, CAPILLARY     Status: None   Collection Time    12/10/13  8:01 AM      Result Value Ref Range   Glucose-Capillary 83  70 - 99 mg/dL  PHOSPHORUS     Status: Abnormal   Collection Time    12/10/13  8:57 AM      Result Value Ref Range   Phosphorus 6.5 (*) 2.3 - 4.6 mg/dL  FERRITIN     Status: None   Collection Time    12/10/13  8:57 AM      Result Value Ref Range   Ferritin 80  10 - 291  ng/mL   Comment: Performed at [facility] TIBC     Status: None   Collection Time    12/10/13  8:57 AM      Result Value Ref Range   Iron 106  42 - 135 ug/dL   TIBC 293  250 - 470 ug/dL   Saturation Ratios 36  20 - 55 %   UIBC 187  125 - 400 ug/dL   Comment: Performed at [place]     Status: Abnormal   Collection Time    12/10/13  8:57 AM      Result Value Ref Range   Sodium 132 (*) 137 - 147 mEq/L   Potassium 6.0 (*) 3.7 - 5.3 mEq/L   Chloride 98  96 - 112 mEq/L   CO2 27  19 - 32 mEq/L   Glucose, Bld 94  70 - 99 mg/dL   BUN 48 (*) 6 - 23 mg/dL   Creatinine, Ser 2.34 (*) 0.50 - 1.10 mg/dL   Calcium 8.5  8.4 - 10.5 mg/dL   GFR  calc non Af Amer 22 (*) >90 mL/min   GFR calc Af Amer 26 (*) >90 mL/min   Comment: (NOTE)     The eGFR has been calculated using the CKD EPI equation.     This calculation has not been validated in all clinical situations.     eGFR's persistently <90 mL/min signify possible Chronic Kidney     Disease.  CBC     Status: Abnormal   Collection Time    12/10/13  8:57 AM      Result Value Ref Range   WBC 6.6  4.0 - 10.5 K/uL   RBC 2.84 (*) 3.87 - 5.11 MIL/uL   Hemoglobin 8.7 (*) 12.0 - 15.0 g/dL   HCT 27.2 (*) 36.0 - 46.0 %   MCV 95.8  78.0 - 100.0 fL   MCH 30.6  26.0 - 34.0 pg   MCHC 32.0  30.0 - 36.0 g/dL   RDW 17.3 (*) 11.5 - 15.5 %   Platelets 151  150 - 400 K/uL  AMMONIA     Status: None   Collection Time    12/10/13  8:57 AM      Result Value Ref Range   Ammonia 55  11 - 60 umol/L  URINALYSIS, ROUTINE W REFLEX MICROSCOPIC     Status: Abnormal   Collection Time    12/10/13 11:05 AM      Result Value Ref Range   Color, Urine YELLOW  YELLOW   APPearance CLOUDY (*) CLEAR   Specific Gravity, Urine >1.030 (*) 1.005 - 1.030   pH 5.0  5.0 - 8.0   Glucose, UA NEGATIVE  NEGATIVE mg/dL   Hgb urine dipstick MODERATE (*) NEGATIVE   Bilirubin Urine NEGATIVE  NEGATIVE   Ketones, ur NEGATIVE   NEGATIVE mg/dL   Protein, ur NEGATIVE  NEGATIVE mg/dL   Urobilinogen, UA 0.2  0.0 - 1.0 mg/dL   Nitrite POSITIVE (*) NEGATIVE   Leukocytes, UA SMALL (*) NEGATIVE  URINE MICROSCOPIC-ADD ON     Status: Abnormal   Collection Time    12/10/13 11:05 AM      Result Value Ref Range   WBC, UA 11-20  <3 WBC/hpf   RBC / HPF 11-20  <3 RBC/hpf   Bacteria, UA FEW (*) RARE  GLUCOSE, CAPILLARY     Status: Abnormal   Collection Time    12/10/13 11:51 AM      Result Value Ref Range   Glucose-Capillary 106 (*) 70 - 99 mg/dL   Comment 1 Notify RN    GLUCOSE, CAPILLARY     Status: None   Collection Time    12/10/13  4:25 PM      Result Value Ref Range   Glucose-Capillary 98  70 - 99 mg/dL  GLUCOSE, CAPILLARY     Status: Abnormal   Collection Time    12/10/13  8:44 PM      Result Value Ref Range   Glucose-Capillary 129 (*) 70 - 99 mg/dL   Comment 1 Notify RN    COMPREHENSIVE METABOLIC PANEL     Status: Abnormal   Collection Time    12/11/13  5:29 AM      Result Value Ref Range   Sodium 138  137 - 147 mEq/L   Potassium 5.0  3.7 - 5.3 mEq/L   Chloride 100  96 - 112 mEq/L   CO2 29  19 - 32 mEq/L   Glucose, Bld 102 (*) 70 - 99 mg/dL   BUN 50 (*)  6 - 23 mg/dL   Creatinine, Ser 2.07 (*) 0.50 - 1.10 mg/dL   Calcium 8.4  8.4 - 10.5 mg/dL   Total Protein 6.1  6.0 - 8.3 g/dL   Albumin 2.5 (*) 3.5 - 5.2 g/dL   AST 35  0 - 37 U/L   ALT 13  0 - 35 U/L   Alkaline Phosphatase 131 (*) 39 - 117 U/L   Total Bilirubin 1.5 (*) 0.3 - 1.2 mg/dL   GFR calc non Af Amer 26 (*) >90 mL/min   GFR calc Af Amer 30 (*) >90 mL/min   Comment: (NOTE)     The eGFR has been calculated using the CKD EPI equation.     This calculation has not been validated in all clinical situations.     eGFR's persistently <90 mL/min signify possible Chronic Kidney     Disease.  GLUCOSE, CAPILLARY     Status: None   Collection Time    12/11/13  7:59 AM      Result Value Ref Range   Glucose-Capillary 99  70 - 99 mg/dL   Comment 1  Notify RN      ABGS No results found for this basename: PHART, PCO2, PO2ART, TCO2, HCO3,  in the last 72 hours CULTURES No results found for this or any previous visit (from the past 240 hour(s)). Studies/Results: US Renal  12/09/2013   CLINICAL DATA:  Elevated creatinine.  EXAM: RENAL/URINARY TRACT ULTRASOUND COMPLETE  COMPARISON:  None.  FINDINGS: Right Kidney:  The right kidney cannot be visualized.  Left Kidney:  The left kidney cannot be visualized.  Bladder:  Decompressed by Foley catheter.  IMPRESSION: The kidneys cannot be visualized. CT can be performed to further delineate if desired.   Electronically Signed   By: Maryclare Bean M.D.   On: 12/09/2013 12:44    Medications:  Prior to Admission:  Prescriptions prior to admission  Medication Sig Dispense Refill  . clonazePAM (KLONOPIN) 1 MG tablet Take 1 mg by mouth 4 (four) times daily as needed for anxiety.      . DULoxetine (CYMBALTA) 20 MG capsule Take 1 capsule (20 mg total) by mouth daily.  30 capsule  3  . furosemide (LASIX) 40 MG tablet Take 1 tablet (40 mg total) by mouth daily.  30 tablet  1  . HYDROcodone-acetaminophen (NORCO/VICODIN) 5-325 MG per tablet Take 1 tablet by mouth every 4 (four) hours as needed for moderate pain.      Marland Kitchen levothyroxine (SYNTHROID, LEVOTHROID) 200 MCG tablet Take 200 mcg by mouth daily before breakfast.      . lisinopril (PRINIVIL,ZESTRIL) 5 MG tablet Take 1 tablet (5 mg total) by mouth daily.  30 tablet  1  . PARoxetine (PAXIL) 20 MG tablet Take 20 mg by mouth every morning.      . potassium chloride SA (K-DUR,KLOR-CON) 20 MEQ tablet Take 40 mEq by mouth daily.      . promethazine (PHENERGAN) 25 MG tablet Take 25 mg by mouth every 6 (six) hours as needed for nausea or vomiting.      . simvastatin (ZOCOR) 40 MG tablet Take 40 mg by mouth every evening.      . temazepam (RESTORIL) 15 MG capsule Take 15 mg by mouth at bedtime as needed for sleep.      . insulin lispro (HUMALOG) 100 UNIT/ML injection  Inject 2-10 Units into the skin 3 (three) times daily before meals. SS       Scheduled: . antiseptic  oral rinse  15 mL Mouth Rinse BID  . aspirin EC  81 mg Oral Daily  . docusate sodium  100 mg Oral BID  . DULoxetine  20 mg Oral Daily  . folic acid  1 mg Oral Daily  . heparin  5,000 Units Subcutaneous 3 times per day  . insulin aspart  0-20 Units Subcutaneous TID WC  . insulin aspart  4 Units Subcutaneous TID WC  . ipratropium-albuterol  3 mL Nebulization Q6H  . levothyroxine  200 mcg Oral QAC breakfast  . multivitamin with minerals  1 tablet Oral Daily  . PARoxetine  20 mg Oral [id number]  . simvastatin  40 mg Oral QPM  . sodium chloride  3 mL Intravenous Q12H  . sodium chloride  3 mL Intravenous Q12H  . thiamine  100 mg Oral Daily  . torsemide  20 mg Oral Daily   Continuous:  HIV:RALCMI chloride, acetaminophen, acetaminophen, alum & mag hydroxide-simeth, bisacodyl, chlorpheniramine-HYDROcodone, clonazePAM, HYDROcodone-acetaminophen, morphine injection, ondansetron (ZOFRAN) IV, ondansetron, polyethylene glycol, promethazine, sodium chloride, temazepam  Assesment: She was admitted with congestive heart failure. She was being diuresed she developed acute on chronic renal failure. She has diabetes which is doing okay. She has chronic anemia and that is being investigated. She is morbidly obese. Her renal function is improving. Active Problems:   Obesity, unspecified   Type II or unspecified type diabetes mellitus without mention of complication, uncontrolled   Anemia   Unspecified hypothyroidism   Acute CHF (congestive heart failure)   CHF (congestive heart failure)    Plan: I would like to see her get up. PT consultation will be requested. Continue to monitor her renal function.    LOS: 5 days   HAWKINS,EDWARD L 12/11/2013, 8:32 AM

## 2013-12-11 NOTE — Evaluation (Signed)
Physical Therapy Evaluation Patient Details Name: [name] Green MRN: [id number] DOB: 01/16/1956 Today's Date: 12/11/2013   History of Present Illness  [name] Green is a 58 y.o. female with multiple medical problems presents to the hospital with increased shortness of breath. She states that she has associated ankle edema noted. She states that she has had some redness in the lower part of her ankles also. She notes there has been no chest pain she denies having fevers. She has had a cough and has had some sputum. She is a former smoker. Patient states that she has some chills and feels cold and that she also has low thyroid. Patient in addition has had generalized weakness and has been having more difficulty walking around. Patient states that she has had to use her inhaler more.  Clinical Impression  Pt is a 58 year old morbidly obese female who presents to physical therapy secondary to dx of SOB. Pt reports continuous use of O2 at home at 2-3L, though does use oxygen when she goes out as the tank is "too heavy".   Pt lives alone in a single story home with 2-3 steps to enter the door; she reports she has a sister, daughter, and friend nearby that are able to help 24/7 as needed.  She reports her friend normally helps her with bed bathing, though she is mod (I) with all other functional mobility skills with use of std cane. Pt sleeps in her recliner and is able to transfer in and out of recliner and amb in the home without physical assistance.  During evaluation, the pt required assist x2 to transfer from supine <-> sit secondary to increased swelling/weight of (B) LE and generalized weakness requiring assist with trunk.  Standing and amb not assessed secondary to level of assistance required with bed mobility, and increased complaints of pain in Rt LE at 9/10.  Pt reports severe pain levels, though reports she has not asked NSG for pain medication; educated pt to ask if needed.  Recommend continued PT  services while in the hospital to address strengthening, activity tolerance for improvement of functional mobility skills, with continued at SNF for additional rehab.  DME recommendations for RW vs. Quad cane for additional assist with functional mobility, and BSC.  Sister does report that she will be able to assist pt 24/7 if needed.      Follow Up Recommendations SNF    Equipment Recommendations  3in1 (PT);Rolling walker with 5" wheels vs. Stephannie PetersQuad Cane   Recommendations for Other Services OT consult     Precautions / Restrictions Precautions Precautions: Fall Restrictions Weight Bearing Restrictions: No      Mobility  Bed Mobility Overal bed mobility: +2 for physical assistance;Needs Assistance Bed Mobility: Supine to Sit;Sit to Supine     Supine to sit: +2 for physical assistance;HOB elevated;Max assist Sit to supine: +2 for physical assistance;HOB elevated;Max assist      Transfers                 General transfer comment: Not assessed secondary to increased assistnace required with bed mobility skills, and severe complaints of pain in Rt LE  Ambulation/Gait             General Gait Details: Not assessed secondary to increased assistance required with bed mobility skills, and severe complaints of pain in Rt LE      Balance Overall balance assessment: Needs assistance Sitting-balance support: Feet supported;Bilateral upper extremity supported Sitting balance-Leahy Scale: Fair  Sitting balance - Comments: Required (B) LE/UE assist to maintain sitting at EOB                                     Pertinent Vitals/Pain Pt reports pain at 9/10 in Rt LE; RN made aware.  Pt reports she has not told NSG pain level, or asked for pain medication this morning.     Home Living Family/patient expects to be discharged to:: Skilled nursing facility Living Arrangements: Alone               Additional Comments: Pt lives in a single story home with  2-3 steps and (B) handrails to enter.  She lives alone, though does have her sister, daughter, and friend who are able to help as needed.  DME: standard cane, tub shower    Prior Function Level of Independence: Independent with assistive device(s)   Gait / Transfers Assistance Needed: Pt reports she was mod (I) with bed mobility skills (sleeps in recliner), transfers, and amb with use of std cane in the Rt hand.   ADL's / Homemaking Assistance Needed: Pt reports her friends helps with bathing.         Hand Dominance   Dominant Hand: Right    Extremity/Trunk Assessment               Lower Extremity Assessment: Generalized weakness         Communication   Communication: No difficulties  Cognition Arousal/Alertness: Lethargic Behavior During Therapy: WFL for tasks assessed/performed Overall Cognitive Status: Within Functional Limits for tasks assessed                               Assessment/Plan    PT Assessment Patient needs continued PT services  PT Diagnosis Difficulty walking;Generalized weakness   PT Problem List Decreased strength;Decreased activity tolerance;Decreased balance;Decreased mobility  PT Treatment Interventions Neuromuscular re-education;Functional mobility training;Therapeutic activities;Stair training;Therapeutic exercise   PT Goals (Current goals can be found in the Care Plan section) Acute Rehab PT Goals Patient Stated Goal: stand up PT Goal Formulation: With patient Time For Goal Achievement: 12/18/13 Potential to Achieve Goals: Good    Frequency Min 3X/week   Barriers to discharge Decreased caregiver support;Inaccessible home environment         End of Session Equipment Utilized During Treatment: Oxygen Activity Tolerance: Patient limited by fatigue Patient left: in bed (Left at EOB with NSG aide to complete bathing)           Time: [id number] PT Time Calculation (min): 41 min   Charges:   PT Evaluation $Initial  PT Evaluation Tier I: 1 Procedure PT Treatments $Therapeutic Activity: 8-22 mins        Elliona Doddridge 12/11/2013, 10:32 AM

## 2013-12-11 NOTE — Progress Notes (Signed)
12/11/13 1415 Patient sleepy, drowsy this afternoon. Received Norco 1 tablet as ordered PRN for pain this morning for c/o leg pain. Pt anxious later in the morning and requested "my Klonopin. I take it in the morning and at night at home". Klonopin 1 mg po given as ordered PRN anxiety. Pt alert and oriented this morning, noted to be sleepy/drowsy this afternoon. Easily arousable, alert and oriented x 4 when awake. Patient's sister expressed concerns regarding patient being sleepy and her medications. Stated "she stays sleepy at home all the time". Notified Dr. Juanetta GoslingHawkins. Order received to decrease Klonopin to 0.5 mg po 4 times daily PRN anxiety. Earnstine RegalAshley Rogers, RN

## 2013-12-11 NOTE — Progress Notes (Signed)
Subjective: Interval History: has no complaint of difficulty in breathing. She is felling better and no new complaint  Objective: Vital signs in last 24 hours: Temp:  [97.5 F (36.4 C)-97.6 F (36.4 C)] 97.5 F (36.4 C) (06/29 0646) Pulse Rate:  [88-98] 94 (06/29 0646) Resp:  [20] 20 (06/29 0646) BP: (94-103)/(35-41) 97/39 mmHg (06/29 0646) SpO2:  [97 %-100 %] 97 % (06/29 0729) Weight change:   Intake/Output from previous day: 06/28 0701 - 06/29 0700 In: 420 [P.O.:360; IV Piggyback:60] Out: 2700 [Urine:2700] Intake/Output this shift:    General appearance: alert, cooperative and no distress Resp: diminished breath sounds posterior - bilateral Cardio: regular rate and rhythm, S1, S2 normal, no murmur, click, rub or gallop GI: Obese, nontender  Extremities: edema 2+ edema bilaterally  Lab Results:  Recent Labs  12/10/13 0857  WBC 6.6  HGB 8.7*  HCT 27.2*  PLT 151   BMET:   Recent Labs  12/10/13 0857 12/11/13 0529  NA 132* 138  K 6.0* 5.0  CL 98 100  CO2 27 29  GLUCOSE 94 102*  BUN 48* 50*  CREATININE 2.34* 2.07*  CALCIUM 8.5 8.4   No results found for this basename: PTH,  in the last 72 hours Iron Studies:   Recent Labs  12/10/13 0857  IRON 106  TIBC 293  FERRITIN 80    Studies/Results: [facility] Renal  12/09/2013   CLINICAL DATA:  Elevated creatinine.  EXAM: RENAL/URINARY TRACT ULTRASOUND COMPLETE  COMPARISON:  None.  FINDINGS: Right Kidney:  The right kidney cannot be visualized.  Left Kidney:  The left kidney cannot be visualized.  Bladder:  Decompressed by Foley catheter.  IMPRESSION: The kidneys cannot be visualized. CT can be performed to further delineate if desired.   Electronically Signed   By: Maryclare BeanArt  Hoss M.D.   On: 12/09/2013 12:44    I have reviewed the patient's current medications.  Assessment/Plan: Problem #1 acute kidney injury: Possibly prerenal versus ATN. Patient presently none oliguric. Problem #2 hyperkalemia: It was a combination of  potassium supplement/ACE inhibitor and worsening of renal failure. Her potassium is normal today Problem #3 difficulty in breathing: Presently she is feeling better. This could be a combination of COPD/sleep apnea/CHF. Patient on iv Lasix and her urine out put has improved and she is feeling better Problem #4 morbid obesity Problem #5 history of hypothyroidism she is on Synthroid Problem #6 diabetes Problem #7 chronic lower extremity edema Problem#8 Facial rash Plan: d/c Lasix Demadex 20 mg po once a day Basic metabolic panel in am    LOS: 5 days   BEFEKADU,BELAYENH S 12/11/2013,8:00 AM

## 2013-12-11 NOTE — Clinical Social Work Psychosocial (Signed)
Clinical Social Work Department BRIEF PSYCHOSOCIAL ASSESSMENT 12/11/2013  Patient:  [name] Green, [name] Green     Account Number:  [id number]     Admit date:  12/06/2013  Clinical Social Worker:  Wyatt Haste  Date/Time:  12/11/2013 11:04 AM  Referred by:  CSW  Date Referred:  12/11/2013 Referred for  SNF Placement   Other Referral:   Interview type:  Patient Other interview type:   sister- Dora    PSYCHOSOCIAL DATA Living Status:  ALONE Admitted from facility:   Level of care:   Primary support name:  Dora Primary support relationship to patient:  SIBLING Degree of support available:   supportive per pt    CURRENT CONCERNS Current Concerns  Post-Acute Placement   Other Concerns:    SOCIAL WORK ASSESSMENT / PLAN CSW met with pt and pt's sister, Sydell Axon at bedside. Pt alert and oriented and reports she lives alone in a 1 bedroom first floor apartment. She describes her daughter and Sydell Axon as her best support. Dora lives in [place], but is planning to stay with pt after she is d/c from hospital. Pt's daughter lives about 5 minutes away and also helps out. Pt states she has neighbors who check on her constantly. She generally manages okay at home. At baseline, pt ambulates with a cane. She is on chronic oxygen. Pt sleeps in recliner chair. She reports that she eats frozen meals most of the time or family brings in food. CSW discussed PT evaluation recommending SNF. Pt immediately began shaking her head and said she was not going to a facility. She states she wants to be at home and has made arrangements to have help at home. They asked about CAP aid. Pt's daughter called while CSW was in room. She states that pt is already on CAP aid wait list and CSW encouraged her to call and see where pt was on list. Pt requesting a lot of equipment for home use and CSW notified CM. Pt frustrated that Medicaid does not cover lift chair, Lifeline, or motorized chair.   Assessment/plan status:  Referral to  [employer] Other assessment/ plan:   Information/referral to community resources:   CM for home health/equipment needs    PATIENT'S/FAMILY'S RESPONSE TO PLAN OF CARE: Pt refusing SNF. CM notified of home health/equipment needs and to follow up. CSW signing off, but can be reconsulted if needed.       Benay Pike, [place]

## 2013-12-12 LAB — BASIC METABOLIC PANEL
BUN: 48 mg/dL — ABNORMAL HIGH (ref 6–23)
CO2: 31 mEq/L (ref 19–32)
CREATININE: 1.63 mg/dL — AB (ref 0.50–1.10)
Calcium: 8.3 mg/dL — ABNORMAL LOW (ref 8.4–10.5)
Chloride: 99 mEq/L (ref 96–112)
GFR, EST AFRICAN AMERICAN: 40 mL/min — AB (ref >90)
GFR, EST NON AFRICAN AMERICAN: 34 mL/min — AB (ref >90)
Glucose, Bld: 105 mg/dL — ABNORMAL HIGH (ref 70–99)
Potassium: 3.9 mEq/L (ref 3.7–5.3)
Sodium: 138 mEq/L (ref 137–147)

## 2013-12-12 LAB — GLUCOSE, CAPILLARY
GLUCOSE-CAPILLARY: 131 mg/dL — AB (ref 70–99)
GLUCOSE-CAPILLARY: 95 mg/dL (ref 70–99)
GLUCOSE-CAPILLARY: 124 mg/dL — AB (ref 70–99)
Glucose-Capillary: 101 mg/dL — ABNORMAL HIGH (ref 70–99)

## 2013-12-12 LAB — C4 COMPLEMENT: COMPLEMENT C4, BODY FLUID: 10 mg/dL — AB (ref 10–40)

## 2013-12-12 LAB — C3 COMPLEMENT: C3 Complement: 118 mg/dL (ref 90–180)

## 2013-12-12 NOTE — Progress Notes (Signed)
UR chart review completed.  

## 2013-12-12 NOTE — Progress Notes (Signed)
Patient out of bed to chair today.Family at bedside.

## 2013-12-12 NOTE — Progress Notes (Signed)
Subjective: She says she feels okay. She has some trouble with constipation. She was hypotensive this morning and is receiving a fluid bolus. It was recommended that she go to a skilled care facility but she has refused  Objective: Vital signs in last 24 hours: Temp:  [97.5 F (36.4 C)-98 F (36.7 C)] 97.6 F (36.4 C) (06/30 0635) Pulse Rate:  [91-96] 96 (06/30 0756) Resp:  [20] 20 (06/30 0635) BP: (63-102)/(36-63) 89/57 mmHg (06/30 0756) SpO2:  [96 %-99 %] 97 % (06/30 0739) Weight change:  Last BM Date: 12/10/13  Intake/Output from previous day: 06/29 0701 - 06/30 0700 In: 843 [P.O.:840; I.V.:3] Out: 1750 [Urine:1750]  PHYSICAL EXAM General appearance: alert, cooperative, mild distress and morbidly obese Resp: rhonchi bilaterally Cardio: regular rate and rhythm, S1, S2 normal, no murmur, click, rub or gallop GI: soft, non-tender; bowel sounds normal; no masses,  no organomegaly Extremities: She still has 1+ edema and I think some of it is from venous insufficiency  Lab Results:  Results for orders placed during the hospital encounter of 12/06/13 (from the past 48 hour(s))  PHOSPHORUS     Status: Abnormal   Collection Time    12/10/13  8:57 AM      Result Value Ref Range   Phosphorus 6.5 (*) 2.3 - 4.6 mg/dL  FERRITIN     Status: None   Collection Time    12/10/13  8:57 AM      Result Value Ref Range   Ferritin 80  10 - 291 ng/mL   Comment: Performed at [facility]     Status: None   Collection Time    12/10/13  8:57 AM      Result Value Ref Range   Iron 106  42 - 135 ug/dL   TIBC 293  250 - 470 ug/dL   Saturation Ratios 36  20 - 55 %   UIBC 187  125 - 400 ug/dL   Comment: Performed at [facility]     Status: Abnormal   Collection Time    12/10/13  8:57 AM      Result Value Ref Range   Sodium 132 (*) 137 - 147 mEq/L   Potassium 6.0 (*) 3.7 - 5.3 mEq/L   Chloride 98  96 - 112 mEq/L   CO2 27  19 - 32 mEq/L    Glucose, Bld 94  70 - 99 mg/dL   BUN 48 (*) 6 - 23 mg/dL   Creatinine, Ser 2.34 (*) 0.50 - 1.10 mg/dL   Calcium 8.5  8.4 - 10.5 mg/dL   GFR calc non Af Amer 22 (*) >90 mL/min   GFR calc Af Amer 26 (*) >90 mL/min   Comment: (NOTE)     The eGFR has been calculated using the CKD EPI equation.     This calculation has not been validated in all clinical situations.     eGFR's persistently <90 mL/min signify possible Chronic Kidney     Disease.  CBC     Status: Abnormal   Collection Time    12/10/13  8:57 AM      Result Value Ref Range   WBC 6.6  4.0 - 10.5 K/uL   RBC 2.84 (*) 3.87 - 5.11 MIL/uL   Hemoglobin 8.7 (*) 12.0 - 15.0 g/dL   HCT 27.2 (*) 36.0 - 46.0 %   MCV 95.8  78.0 - 100.0 fL   MCH 30.6  26.0 -  34.0 pg   MCHC 32.0  30.0 - 36.0 g/dL   RDW 17.3 (*) 11.5 - 15.5 %   Platelets 151  150 - 400 K/uL  AMMONIA     Status: None   Collection Time    12/10/13  8:57 AM      Result Value Ref Range   Ammonia 55  11 - 60 umol/L  URINALYSIS, ROUTINE W REFLEX MICROSCOPIC     Status: Abnormal   Collection Time    12/10/13 11:05 AM      Result Value Ref Range   Color, Urine YELLOW  YELLOW   APPearance CLOUDY (*) CLEAR   Specific Gravity, Urine >1.030 (*) 1.005 - 1.030   pH 5.0  5.0 - 8.0   Glucose, UA NEGATIVE  NEGATIVE mg/dL   Hgb urine dipstick MODERATE (*) NEGATIVE   Bilirubin Urine NEGATIVE  NEGATIVE   Ketones, ur NEGATIVE  NEGATIVE mg/dL   Protein, ur NEGATIVE  NEGATIVE mg/dL   Urobilinogen, UA 0.2  0.0 - 1.0 mg/dL   Nitrite POSITIVE (*) NEGATIVE   Leukocytes, UA SMALL (*) NEGATIVE  URINE MICROSCOPIC-ADD ON     Status: Abnormal   Collection Time    12/10/13 11:05 AM      Result Value Ref Range   WBC, UA 11-20  <3 WBC/hpf   RBC / HPF 11-20  <3 RBC/hpf   Bacteria, UA FEW (*) RARE  GLUCOSE, CAPILLARY     Status: Abnormal   Collection Time    12/10/13 11:51 AM      Result Value Ref Range   Glucose-Capillary 106 (*) 70 - 99 mg/dL   Comment 1 Notify RN    GLUCOSE,  CAPILLARY     Status: None   Collection Time    12/10/13  4:25 PM      Result Value Ref Range   Glucose-Capillary 98  70 - 99 mg/dL  GLUCOSE, CAPILLARY     Status: Abnormal   Collection Time    12/10/13  8:44 PM      Result Value Ref Range   Glucose-Capillary 129 (*) 70 - 99 mg/dL   Comment 1 Notify RN    COMPREHENSIVE METABOLIC PANEL     Status: Abnormal   Collection Time    12/11/13  5:29 AM      Result Value Ref Range   Sodium 138  137 - 147 mEq/L   Potassium 5.0  3.7 - 5.3 mEq/L   Chloride 100  96 - 112 mEq/L   CO2 29  19 - 32 mEq/L   Glucose, Bld 102 (*) 70 - 99 mg/dL   BUN 50 (*) 6 - 23 mg/dL   Creatinine, Ser 2.07 (*) 0.50 - 1.10 mg/dL   Calcium 8.4  8.4 - 10.5 mg/dL   Total Protein 6.1  6.0 - 8.3 g/dL   Albumin 2.5 (*) 3.5 - 5.2 g/dL   AST 35  0 - 37 U/L   ALT 13  0 - 35 U/L   Alkaline Phosphatase 131 (*) 39 - 117 U/L   Total Bilirubin 1.5 (*) 0.3 - 1.2 mg/dL   GFR calc non Af Amer 26 (*) >90 mL/min   GFR calc Af Amer 30 (*) >90 mL/min   Comment: (NOTE)     The eGFR has been calculated using the CKD EPI equation.     This calculation has not been validated in all clinical situations.     eGFR's persistently <90 mL/min signify possible Chronic Kidney  Disease.  GLUCOSE, CAPILLARY     Status: None   Collection Time    12/11/13  7:59 AM      Result Value Ref Range   Glucose-Capillary 99  70 - 99 mg/dL   Comment 1 Notify RN    GLUCOSE, CAPILLARY     Status: Abnormal   Collection Time    12/11/13 11:54 AM      Result Value Ref Range   Glucose-Capillary 125 (*) 70 - 99 mg/dL   Comment 1 Notify RN    GLUCOSE, CAPILLARY     Status: Abnormal   Collection Time    12/11/13  4:58 PM      Result Value Ref Range   Glucose-Capillary 107 (*) 70 - 99 mg/dL   Comment 1 Notify RN    GLUCOSE, CAPILLARY     Status: Abnormal   Collection Time    12/11/13  9:26 PM      Result Value Ref Range   Glucose-Capillary 106 (*) 70 - 99 mg/dL   Comment 1 Documented in Chart      Comment 2 Notify RN    BASIC METABOLIC PANEL     Status: Abnormal   Collection Time    12/12/13  6:14 AM      Result Value Ref Range   Sodium 138  137 - 147 mEq/L   Potassium 3.9  3.7 - 5.3 mEq/L   Comment: DELTA CHECK NOTED   Chloride 99  96 - 112 mEq/L   CO2 31  19 - 32 mEq/L   Glucose, Bld 105 (*) 70 - 99 mg/dL   BUN 48 (*) 6 - 23 mg/dL   Creatinine, Ser 1.63 (*) 0.50 - 1.10 mg/dL   Calcium 8.3 (*) 8.4 - 10.5 mg/dL   GFR calc non Af Amer 34 (*) >90 mL/min   GFR calc Af Amer 40 (*) >90 mL/min   Comment: (NOTE)     The eGFR has been calculated using the CKD EPI equation.     This calculation has not been validated in all clinical situations.     eGFR's persistently <90 mL/min signify possible Chronic Kidney     Disease.  GLUCOSE, CAPILLARY     Status: Abnormal   Collection Time    12/12/13  7:33 AM      Result Value Ref Range   Glucose-Capillary 101 (*) 70 - 99 mg/dL   Comment 1 Notify RN      ABGS No results found for this basename: PHART, PCO2, PO2ART, TCO2, HCO3,  in the last 72 hours CULTURES No results found for this or any previous visit (from the past 240 hour(s)). Studies/Results: No results found.  Medications:  Prior to Admission:  Prescriptions prior to admission  Medication Sig Dispense Refill  . clonazePAM (KLONOPIN) 1 MG tablet Take 1 mg by mouth 4 (four) times daily as needed for anxiety.      . DULoxetine (CYMBALTA) 20 MG capsule Take 1 capsule (20 mg total) by mouth daily.  30 capsule  3  . furosemide (LASIX) 40 MG tablet Take 1 tablet (40 mg total) by mouth daily.  30 tablet  1  . HYDROcodone-acetaminophen (NORCO/VICODIN) 5-325 MG per tablet Take 1 tablet by mouth every 4 (four) hours as needed for moderate pain.      Marland Kitchen levothyroxine (SYNTHROID, LEVOTHROID) 200 MCG tablet Take 200 mcg by mouth daily before breakfast.      . lisinopril (PRINIVIL,ZESTRIL) 5 MG tablet Take 1 tablet (5 mg  total) by mouth daily.  30 tablet  1  . PARoxetine (PAXIL) 20 MG  tablet Take 20 mg by mouth every morning.      . potassium chloride SA (K-DUR,KLOR-CON) 20 MEQ tablet Take 40 mEq by mouth daily.      . promethazine (PHENERGAN) 25 MG tablet Take 25 mg by mouth every 6 (six) hours as needed for nausea or vomiting.      . simvastatin (ZOCOR) 40 MG tablet Take 40 mg by mouth every evening.      . temazepam (RESTORIL) 15 MG capsule Take 15 mg by mouth at bedtime as needed for sleep.      . insulin lispro (HUMALOG) 100 UNIT/ML injection Inject 2-10 Units into the skin 3 (three) times daily before meals. SS       Scheduled: . antiseptic oral rinse  15 mL Mouth Rinse BID  . aspirin EC  81 mg Oral Daily  . docusate sodium  100 mg Oral BID  . DULoxetine  20 mg Oral Daily  . folic acid  1 mg Oral Daily  . heparin  5,000 Units Subcutaneous 3 times per day  . insulin aspart  0-20 Units Subcutaneous TID WC  . insulin aspart  4 Units Subcutaneous TID WC  . ipratropium-albuterol  3 mL Nebulization Q6H  . levothyroxine  200 mcg Oral QAC breakfast  . multivitamin with minerals  1 tablet Oral Daily  . PARoxetine  20 mg Oral BH-q7a  . simvastatin  40 mg Oral QPM  . sodium chloride  3 mL Intravenous Q12H  . sodium chloride  3 mL Intravenous Q12H  . thiamine  100 mg Oral Daily  . torsemide  20 mg Oral Daily   Continuous:  MLY:YTKPTW chloride, acetaminophen, acetaminophen, alum & mag hydroxide-simeth, bisacodyl, chlorpheniramine-HYDROcodone, clonazePAM, HYDROcodone-acetaminophen, morphine injection, ondansetron (ZOFRAN) IV, ondansetron, polyethylene glycol, promethazine, sodium chloride, temazepam  Assesment: She was admitted with congestive heart failure. Her systolic heart function is normal and because of her body habitus diastolic function could not be assessed. She is morbidly obese which complicates her treatment. She has diabetes which is pretty stable at this point. She has positive markers for lupus but it certainly does not appear that her renal problems are  related to that because she's improved. She will need outpatient rheumatology evaluation. She is deconditioned and will try to get her up and moving. Her albumin level is low which also leads to some of her volume problems. Active Problems:   Obesity, unspecified   Type II or unspecified type diabetes mellitus without mention of complication, uncontrolled   Anemia   Unspecified hypothyroidism   Acute CHF (congestive heart failure)   CHF (congestive heart failure)    Plan: She's going to need a fluid bolus. Try get her up and out of bed more.    LOS: 6 days   HAWKINS,EDWARD L 12/12/2013, 8:10 AM

## 2013-12-12 NOTE — Progress Notes (Addendum)
[name] Green  MRN: [id number]  DOB/AGE: Apr 19, 1956 58 y.o.  Primary Care Physician:FANTA,TESFAYE, MD  Admit date: 12/06/2013  Chief Complaint:  Chief Complaint  Patient presents with  . Shortness of Breath    S-Pt presented on  12/06/2013 with  Chief Complaint  Patient presents with  . Shortness of Breath  .    Pt offers no new complaints.    Pt was found to be hypotensive but no symptoms of dizziness/shortness of breath.    No signs of AMS.   Meds . antiseptic oral rinse  15 mL Mouth Rinse BID  . aspirin EC  81 mg Oral Daily  . docusate sodium  100 mg Oral BID  . DULoxetine  20 mg Oral Daily  . folic acid  1 mg Oral Daily  . heparin  5,000 Units Subcutaneous 3 times per day  . insulin aspart  0-20 Units Subcutaneous TID WC  . insulin aspart  4 Units Subcutaneous TID WC  . ipratropium-albuterol  3 mL Nebulization Q6H  . levothyroxine  200 mcg Oral QAC breakfast  . multivitamin with minerals  1 tablet Oral Daily  . PARoxetine  20 mg Oral BH-q7a  . simvastatin  40 mg Oral QPM  . sodium chloride  3 mL Intravenous Q12H  . sodium chloride  3 mL Intravenous Q12H  . thiamine  100 mg Oral Daily  . torsemide  20 mg Oral Daily     Physical Exam: Vital signs in last 24 hours: Temp:  [97.5 F (36.4 C)-98 F (36.7 C)] 97.6 F (36.4 C) (06/30 0635) Pulse Rate:  [91-94] 92 (06/30 0635) Resp:  [20] 20 (06/30 0635) BP: (69-102)/(46-63) 69/46 mmHg (06/30 0635) SpO2:  [96 %-99 %] 97 % (06/30 0739) Weight change:  Last BM Date: 12/10/13  Intake/Output from previous day: 06/29 0701 - 06/30 0700 In: 843 [P.O.:840; I.V.:3] Out: 1750 [Urine:1750]     Physical Exam: General- pt is awake,alert, oriented to time place and person Resp- No acute REsp distress, Decreased bs at bases CVS- S1S2 regular in rate and rhythm GIT- BS+, soft, NT, ND, Morbidly obese EXT- Trace LE Edema,NO  Cyanosis   Lab Results: CBC  Recent Labs  12/10/13 0857  WBC 6.6  HGB 8.7*  HCT 27.2*   PLT 151    BMET  Recent Labs  12/11/13 0529 12/12/13 0614  NA 138 138  K 5.0 3.9  CL 100 99  CO2 29 31  GLUCOSE 102* 105*  BUN 50* 48*  CREATININE 2.07* 1.63*  CALCIUM 8.4 8.3*     Trend Creat 2015  1.12=>2.47=>2.07=>1.63  Trend Potassium 2015 6.1=>5.0=>3.9    Lab Results  Component Value Date   CALCIUM 8.3* 12/12/2013   PHOS 6.5* 12/10/2013         Impression: 1)Renal  AKI secondary to Prerenal/ATN                AKI improving                 Unlikely to be sec to Lupus as GFr much better               2)Hypotension- BP on lower side but asymptomatic. Medication- On Diuretics  3)Anemia HGb stable  Iron deficincy  4)Hypothyroidism On Levothyroxine.  5)Chronic LE dema On Diuretics  PMD following  6)Electrolytes  Hyperkalemic- now better Hyponatremic-now better   7)Acid base Co2 at goal  8) ?SLE - ANA Positive, Complement C4 low but C3 normal  Plan:  Will give to Ns bolus as hypotensive. Pt will benefit from Rheumatology visit as outpt     BHUTANI,MANPREET S 12/12/2013, 7:50 AM

## 2013-12-12 NOTE — Progress Notes (Signed)
Patient's B/P 63/36,heart rate 94,Dr Bhutani notified,orders received,and given.No c/o pain or discomfort noted,asymptomatic.Recheck B/P after start of NS at 125 ml/hr was 89/57,heart rate 96.Family at bedside will continue.Will continue to monitor patient.

## 2013-12-13 LAB — GLUCOSE, CAPILLARY
GLUCOSE-CAPILLARY: 131 mg/dL — AB (ref 70–99)
GLUCOSE-CAPILLARY: 106 mg/dL — AB (ref 70–99)
Glucose-Capillary: 115 mg/dL — ABNORMAL HIGH (ref 70–99)
Glucose-Capillary: 97 mg/dL (ref 70–99)

## 2013-12-13 LAB — BASIC METABOLIC PANEL
BUN: 40 mg/dL — ABNORMAL HIGH (ref 6–23)
CALCIUM: 8.7 mg/dL (ref 8.4–10.5)
CO2: 30 mEq/L (ref 19–32)
Chloride: 101 mEq/L (ref 96–112)
Creatinine, Ser: 1.22 mg/dL — ABNORMAL HIGH (ref 0.50–1.10)
GFR calc Af Amer: 56 mL/min — ABNORMAL LOW (ref >90)
GFR calc non Af Amer: 49 mL/min — ABNORMAL LOW (ref >90)
Glucose, Bld: 104 mg/dL — ABNORMAL HIGH (ref 70–99)
Potassium: 4 mEq/L (ref 3.7–5.3)
SODIUM: 140 meq/L (ref 137–147)

## 2013-12-13 MED ORDER — IPRATROPIUM-ALBUTEROL 0.5-2.5 (3) MG/3ML IN SOLN
3.0000 mL | Freq: Three times a day (TID) | RESPIRATORY_TRACT | Status: DC
  Administered 2013-12-14: 3 mL via RESPIRATORY_TRACT
  Filled 2013-12-13: qty 3

## 2013-12-13 MED ORDER — HYDROCORTISONE ACETATE 25 MG RE SUPP
25.0000 mg | Freq: Two times a day (BID) | RECTAL | Status: DC
  Filled 2013-12-13 (×5): qty 1

## 2013-12-13 MED ORDER — ALBUTEROL SULFATE (2.5 MG/3ML) 0.083% IN NEBU: 2.5000 mg | INHALATION_SOLUTION | RESPIRATORY_TRACT | Status: DC | PRN

## 2013-12-13 NOTE — Progress Notes (Signed)
[name] Green  MRN: [id number]  DOB/AGE: Nov 25, 1955 58 y.o.  Primary Care Physician:FANTA,TESFAYE, MD  Admit date: 12/06/2013  Chief Complaint:  Chief Complaint  Patient presents with  . Shortness of Breath    S-Pt presented on  12/06/2013 with  Chief Complaint  Patient presents with  . Shortness of Breath  .    Pt offers no new complaints.    Pt man concern is that she wants to go home.   Meds . antiseptic oral rinse  15 mL Mouth Rinse BID  . aspirin EC  81 mg Oral Daily  . docusate sodium  100 mg Oral BID  . DULoxetine  20 mg Oral Daily  . folic acid  1 mg Oral Daily  . heparin  5,000 Units Subcutaneous 3 times per day  . hydrocortisone  25 mg Rectal BID  . insulin aspart  0-20 Units Subcutaneous TID WC  . insulin aspart  4 Units Subcutaneous TID WC  . ipratropium-albuterol  3 mL Nebulization Q6H  . levothyroxine  200 mcg Oral QAC breakfast  . multivitamin with minerals  1 tablet Oral Daily  . PARoxetine  20 mg Oral BH-q7a  . simvastatin  40 mg Oral QPM  . sodium chloride  3 mL Intravenous Q12H  . sodium chloride  3 mL Intravenous Q12H  . thiamine  100 mg Oral Daily  . torsemide  20 mg Oral Daily     Physical Exam: Vital signs in last 24 hours: Temp:  [97.5 F (36.4 C)-98.5 F (36.9 C)] 97.8 F (36.6 C) (07/01 0623) Pulse Rate:  [92-105] 92 (07/01 0623) Resp:  [20] 20 (07/01 0623) BP: (96-127)/(50-64) 109/50 mmHg (07/01 0623) SpO2:  [88 %-98 %] 88 % (07/01 0826) Weight:  [335 lb 1.6 oz (152 kg)] 335 lb 1.6 oz (152 kg) (07/01 21300623) Weight change:  Last BM Date: 12/10/13  Intake/Output from previous day: 06/30 0701 - 07/01 0700 In: 1750.8 [P.O.:480; I.V.:1270.8] Out: 2450 [Urine:2450] Total I/O In: -  Out: 250 [Urine:250]   Physical Exam: General- pt is awake,alert, oriented to time place and person Resp- No acute REsp distress, Decreased bs at bases CVS- S1S2 regular in rate and rhythm GIT- BS+, soft, NT, ND, Morbidly obese EXT- Trace LE  Edema( better),NO  Cyanosis   Lab Results:  HGb 8.7 (12/10/13)   BMET  Recent Labs  12/12/13 0614 12/13/13 0555  NA 138 140  K 3.9 4.0  CL 99 101  CO2 31 30  GLUCOSE 105* 104*  BUN 48* 40*  CREATININE 1.63* 1.22*  CALCIUM 8.3* 8.7     Trend Creat 2015  1.12=>2.47=>2.07=>1.63=>1.22  Trend Potassium 2015 6.1=>5.0=>3.9=>4.0    Lab Results  Component Value Date   CALCIUM 8.7 12/13/2013   PHOS 6.5* 12/10/2013         Impression: 1)Renal  AKI secondary to Prerenal/ATN                AKI improving                 Unlikely to be sec to Lupus as GFr much better                Creat much better  2)Hypotension- BP on lower side but asymptomatic. Medication- On Diuretics  3)Anemia HGb stable  Iron deficincy  4)Hypothyroidism On Levothyroxine.  5)Chronic LE dema On Diuretics  PMD following  6)Electrolytes  Hyperkalemic- now better Hyponatremic-now better   7)Acid base Co2 at goal  8) ?SLE -  ANA Positive, Complement C4 low but C3 normal           Plan:  Will continue current care    BHUTANI,MANPREET S 12/13/2013, 10:11 AM

## 2013-12-13 NOTE — Progress Notes (Signed)
Subjective: She says she feels better. She did get up more yesterday. She had a bowel movement today but had to strain and there's been some blood with it. She has no other new complaints. After she had a bowel movement her oxygen saturation dropped but it has come back up now.  Objective: Vital signs in last 24 hours: Temp:  [97.5 F (36.4 C)-98.5 F (36.9 C)] 97.8 F (36.6 C) (07/01 0623) Pulse Rate:  [92-105] 92 (07/01 0623) Resp:  [20] 20 (07/01 0623) BP: (84-127)/(44-64) 109/50 mmHg (07/01 0623) SpO2:  [88 %-98 %] 88 % (07/01 0826) Weight:  [151.728 kg (334 lb 8 oz)-152 kg (335 lb 1.6 oz)] 152 kg (335 lb 1.6 oz) (07/01 7414) Weight change:  Last BM Date: 12/10/13  Intake/Output from previous day: 06/30 0701 - 07/01 0700 In: 1750.8 [P.O.:480; I.V.:1270.8] Out: 2450 [Urine:2450]  PHYSICAL EXAM General appearance: alert, cooperative, mild distress and morbidly obese Resp: clear to auscultation bilaterally Cardio: regular rate and rhythm, S1, S2 normal, no murmur, click, rub or gallop GI: soft, non-tender; bowel sounds normal; no masses,  no organomegaly Extremities: She still has 1-2+ edema.  Lab Results:  Results for orders placed during the hospital encounter of 12/06/13 (from the past 48 hour(s))  GLUCOSE, CAPILLARY     Status: Abnormal   Collection Time    12/11/13 11:54 AM      Result Value Ref Range   Glucose-Capillary 125 (*) 70 - 99 mg/dL   Comment 1 Notify RN    GLUCOSE, CAPILLARY     Status: Abnormal   Collection Time    12/11/13  4:58 PM      Result Value Ref Range   Glucose-Capillary 107 (*) 70 - 99 mg/dL   Comment 1 Notify RN    GLUCOSE, CAPILLARY     Status: Abnormal   Collection Time    12/11/13  9:26 PM      Result Value Ref Range   Glucose-Capillary 106 (*) 70 - 99 mg/dL   Comment 1 Documented in Chart     Comment 2 Notify RN    BASIC METABOLIC PANEL     Status: Abnormal   Collection Time    12/12/13  6:14 AM      Result Value Ref Range    Sodium 138  137 - 147 mEq/L   Potassium 3.9  3.7 - 5.3 mEq/L   Comment: DELTA CHECK NOTED   Chloride 99  96 - 112 mEq/L   CO2 31  19 - 32 mEq/L   Glucose, Bld 105 (*) 70 - 99 mg/dL   BUN 48 (*) 6 - 23 mg/dL   Creatinine, Ser 1.63 (*) 0.50 - 1.10 mg/dL   Calcium 8.3 (*) 8.4 - 10.5 mg/dL   GFR calc non Af Amer 34 (*) >90 mL/min   GFR calc Af Amer 40 (*) >90 mL/min   Comment: (NOTE)     The eGFR has been calculated using the CKD EPI equation.     This calculation has not been validated in all clinical situations.     eGFR's persistently <90 mL/min signify possible Chronic Kidney     Disease.  GLUCOSE, CAPILLARY     Status: Abnormal   Collection Time    12/12/13  7:33 AM      Result Value Ref Range   Glucose-Capillary 101 (*) 70 - 99 mg/dL   Comment 1 Notify RN    GLUCOSE, CAPILLARY     Status: Abnormal   Collection  Time    12/12/13 11:34 AM      Result Value Ref Range   Glucose-Capillary 131 (*) 70 - 99 mg/dL  GLUCOSE, CAPILLARY     Status: None   Collection Time    12/12/13  4:32 PM      Result Value Ref Range   Glucose-Capillary 95  70 - 99 mg/dL   Comment 1 Notify RN     Comment 2 Documented in Chart    GLUCOSE, CAPILLARY     Status: Abnormal   Collection Time    12/12/13  8:23 PM      Result Value Ref Range   Glucose-Capillary 124 (*) 70 - 99 mg/dL   Comment 1 Notify RN     Comment 2 Documented in Chart    BASIC METABOLIC PANEL     Status: Abnormal   Collection Time    12/13/13  5:55 AM      Result Value Ref Range   Sodium 140  137 - 147 mEq/L   Potassium 4.0  3.7 - 5.3 mEq/L   Chloride 101  96 - 112 mEq/L   CO2 30  19 - 32 mEq/L   Glucose, Bld 104 (*) 70 - 99 mg/dL   BUN 40 (*) 6 - 23 mg/dL   Creatinine, Ser 1.22 (*) 0.50 - 1.10 mg/dL   Calcium 8.7  8.4 - 10.5 mg/dL   GFR calc non Af Amer 49 (*) >90 mL/min   GFR calc Af Amer 56 (*) >90 mL/min   Comment: (NOTE)     The eGFR has been calculated using the CKD EPI equation.     This calculation has not been  validated in all clinical situations.     eGFR's persistently <90 mL/min signify possible Chronic Kidney     Disease.  GLUCOSE, CAPILLARY     Status: Abnormal   Collection Time    12/13/13  7:46 AM      Result Value Ref Range   Glucose-Capillary 115 (*) 70 - 99 mg/dL   Comment 1 Notify RN      ABGS No results found for this basename: PHART, PCO2, PO2ART, TCO2, HCO3,  in the last 72 hours CULTURES No results found for this or any previous visit (from the past 240 hour(s)). Studies/Results: No results found.  Medications:  Prior to Admission:  Prescriptions prior to admission  Medication Sig Dispense Refill  . clonazePAM (KLONOPIN) 1 MG tablet Take 1 mg by mouth 4 (four) times daily as needed for anxiety.      . DULoxetine (CYMBALTA) 20 MG capsule Take 1 capsule (20 mg total) by mouth daily.  30 capsule  3  . furosemide (LASIX) 40 MG tablet Take 1 tablet (40 mg total) by mouth daily.  30 tablet  1  . HYDROcodone-acetaminophen (NORCO/VICODIN) 5-325 MG per tablet Take 1 tablet by mouth every 4 (four) hours as needed for moderate pain.      Marland Kitchen levothyroxine (SYNTHROID, LEVOTHROID) 200 MCG tablet Take 200 mcg by mouth daily before breakfast.      . lisinopril (PRINIVIL,ZESTRIL) 5 MG tablet Take 1 tablet (5 mg total) by mouth daily.  30 tablet  1  . PARoxetine (PAXIL) 20 MG tablet Take 20 mg by mouth every morning.      . potassium chloride SA (K-DUR,KLOR-CON) 20 MEQ tablet Take 40 mEq by mouth daily.      . promethazine (PHENERGAN) 25 MG tablet Take 25 mg by mouth every 6 (six) hours as needed  for nausea or vomiting.      . simvastatin (ZOCOR) 40 MG tablet Take 40 mg by mouth every evening.      . temazepam (RESTORIL) 15 MG capsule Take 15 mg by mouth at bedtime as needed for sleep.      . insulin lispro (HUMALOG) 100 UNIT/ML injection Inject 2-10 Units into the skin 3 (three) times daily before meals. SS       Scheduled: . antiseptic oral rinse  15 mL Mouth Rinse BID  . aspirin EC  81  mg Oral Daily  . docusate sodium  100 mg Oral BID  . DULoxetine  20 mg Oral Daily  . folic acid  1 mg Oral Daily  . heparin  5,000 Units Subcutaneous 3 times per day  . insulin aspart  0-20 Units Subcutaneous TID WC  . insulin aspart  4 Units Subcutaneous TID WC  . ipratropium-albuterol  3 mL Nebulization Q6H  . levothyroxine  200 mcg Oral QAC breakfast  . multivitamin with minerals  1 tablet Oral Daily  . PARoxetine  20 mg Oral BH-q7a  . simvastatin  40 mg Oral QPM  . sodium chloride  3 mL Intravenous Q12H  . sodium chloride  3 mL Intravenous Q12H  . thiamine  100 mg Oral Daily  . torsemide  20 mg Oral Daily   Continuous:  VFI:EPPIRJ chloride, acetaminophen, acetaminophen, alum & mag hydroxide-simeth, bisacodyl, chlorpheniramine-HYDROcodone, clonazePAM, HYDROcodone-acetaminophen, morphine injection, ondansetron (ZOFRAN) IV, ondansetron, polyethylene glycol, promethazine, sodium chloride, temazepam  Assesment: I told her I was concerned about her prognosis. I think she needs to go to a skilled care facility and she still refuses. I told her I was concerned that she would go home and within a few days have to come back to the hospital. She insisted she wants to go home. I told her that I would like to see her get up and moved here in the hospital to prove to me that she can do that safely. She needs to have her oxygen monitored. Her renal function has improved significantly. She has some blood with a hard bowel movement and I will treat that. We can discontinue her Foley catheter today. Active Problems:   Obesity, unspecified   Type II or unspecified type diabetes mellitus without mention of complication, uncontrolled   Anemia   Unspecified hypothyroidism   Acute CHF (congestive heart failure)   CHF (congestive heart failure)    Plan: She will have some hemorrhoid suppositories. Discontinue Foley. Try to get her more active.    LOS: 7 days   Raisa Ditto L 12/13/2013, 8:29  AM

## 2013-12-13 NOTE — Progress Notes (Signed)
PT Cancellation Note  Patient Details Name: [name] Green MRN: [id number] DOB: 1955/08/19   Cancelled Treatment:    Reason Eval/Treat Not Completed: Other (comment) Sister present requesting therapy be held today.  Patient lying in bed on 3.5L02, sleeping and breathing heavily.  Daughter states they got patient up earlier and 02 dropped to 75% on 3.5L02.  Explained the importance of therapy to increase endurance and improve medical condition to family member.   Lurena NidaAmy B Duvid Smalls, PTA/CLT 12/13/2013, 4:10 PM

## 2013-12-14 LAB — GLUCOSE, CAPILLARY
GLUCOSE-CAPILLARY: 102 mg/dL — AB (ref 70–99)
Glucose-Capillary: 91 mg/dL (ref 70–99)

## 2013-12-14 MED ORDER — CLONAZEPAM 1 MG PO TABS: 0.5000 mg | ORAL_TABLET | Freq: Four times a day (QID) | ORAL | Status: AC | PRN

## 2013-12-14 MED ORDER — FOLIC ACID 1 MG PO TABS: 1.0000 mg | ORAL_TABLET | Freq: Every day | ORAL | Status: AC

## 2013-12-14 MED ORDER — ASPIRIN 81 MG PO TBEC: 81.0000 mg | DELAYED_RELEASE_TABLET | Freq: Every day | ORAL | Status: AC

## 2013-12-14 MED ORDER — HYDROCORTISONE ACETATE 25 MG RE SUPP: 25.0000 mg | Freq: Two times a day (BID) | RECTAL | Status: DC

## 2013-12-14 MED ORDER — ALBUTEROL SULFATE (2.5 MG/3ML) 0.083% IN NEBU: 2.5000 mg | INHALATION_SOLUTION | RESPIRATORY_TRACT | Status: AC | PRN

## 2013-12-14 NOTE — Progress Notes (Signed)
Elayne Snareunice C Tat  MRN: [id number]  DOB/AGE: 02/01/1956 58 y.o.  Primary Care Physician:FANTA,TESFAYE, MD  Admit date: 12/06/2013  Chief Complaint:  Chief Complaint  Patient presents with  . Shortness of Breath    S-Pt presented on  12/06/2013 with  Chief Complaint  Patient presents with  . Shortness of Breath  .    Pt offers no new complaints.    Apt is glad that she is going home.   Meds . antiseptic oral rinse  15 mL Mouth Rinse BID  . aspirin EC  81 mg Oral Daily  . docusate sodium  100 mg Oral BID  . DULoxetine  20 mg Oral Daily  . folic acid  1 mg Oral Daily  . heparin  5,000 Units Subcutaneous 3 times per day  . hydrocortisone  25 mg Rectal BID  . insulin aspart  0-20 Units Subcutaneous TID WC  . insulin aspart  4 Units Subcutaneous TID WC  . ipratropium-albuterol  3 mL Nebulization TID  . levothyroxine  200 mcg Oral QAC breakfast  . multivitamin with minerals  1 tablet Oral Daily  . PARoxetine  20 mg Oral BH-q7a  . simvastatin  40 mg Oral QPM  . sodium chloride  3 mL Intravenous Q12H  . sodium chloride  3 mL Intravenous Q12H  . thiamine  100 mg Oral Daily  . torsemide  20 mg Oral Daily     Physical Exam: Vital signs in last 24 hours: Temp:  [97.2 F (36.2 C)-97.9 F (36.6 C)] 97.6 F (36.4 C) (07/02 0505) Pulse Rate:  [56-99] 98 (07/02 0505) Resp:  [20] 20 (07/02 0505) BP: (92-133)/(54-74) 133/74 mmHg (07/02 0505) SpO2:  [95 %-98 %] 95 % (07/02 0657) Weight:  [336 lb 13.8 oz (152.8 kg)] 336 lb 13.8 oz (152.8 kg) (07/02 0505) Weight change: 2 lb 5.8 oz (1.072 kg) Last BM Date: 12/13/13  Intake/Output from previous day: 07/01 0701 - 07/02 0700 In: 580 [P.O.:580] Out: 750 [Urine:750]     Physical Exam: General- pt is awake,alert, oriented to time place and person Resp- No acute REsp distress, Decreased bs at bases CVS- S1S2 regular in rate and rhythm GIT- BS+, soft, NT, ND, Morbidly obese EXT- Trace LE Edema( better),NO  Cyanosis   Lab  Results:  HGb 8.7 (12/10/13)   BMET  Recent Labs  12/12/13 0614 12/13/13 0555  NA 138 140  K 3.9 4.0  CL 99 101  CO2 31 30  GLUCOSE 105* 104*  BUN 48* 40*  CREATININE 1.63* 1.22*  CALCIUM 8.3* 8.7     Trend Creat 2015  1.12=>2.47=>2.07=>1.63=>1.22  Trend Potassium 2015 6.1=>5.0=>3.9=>4.0    Lab Results  Component Value Date   CALCIUM 8.7 12/13/2013   PHOS 6.5* 12/10/2013         Impression: 1)Renal  AKI secondary to Prerenal/ATN                AKI improving                 Unlikely to be sec to Lupus as GFr much better                Creat much better  2)Hypotension- BP on lower side but asymptomatic. Medication- On Diuretics  3)Anemia HGb stable  Iron deficincy  4)Hypothyroidism On Levothyroxine.  5)Chronic LE dema On Diuretics  PMD following  6)Electrolytes  Hyperkalemic- now better Hyponatremic-now better   7)Acid base Co2 at goal  8) ?SLE - ANA Positive, Complement  C4 low but C3 normal           Plan:  Will continue current care    Zannie Locastro S 12/14/2013, 9:40 AM

## 2013-12-14 NOTE — Progress Notes (Signed)
Subjective: She says she feels better. She wants to go home.  Objective: Vital signs in last 24 hours: Temp:  [97.2 F (36.2 C)-97.9 F (36.6 C)] 97.6 F (36.4 C) (07/02 0505) Pulse Rate:  [56-99] 98 (07/02 0505) Resp:  [20] 20 (07/02 0505) BP: (92-133)/(54-74) 133/74 mmHg (07/02 0505) SpO2:  [88 %-98 %] 95 % (07/02 0657) Weight:  [152.8 kg (336 lb 13.8 oz)] 152.8 kg (336 lb 13.8 oz) (07/02 0505) Weight change: 1.072 kg (2 lb 5.8 oz) Last BM Date: 12/13/13  Intake/Output from previous day: 07/01 0701 - 07/02 0700 In: 10 [P.O.:580] Out: 750 [Urine:750]  PHYSICAL EXAM General appearance: alert, cooperative, no distress and morbidly obese Resp: rhonchi bilaterally Cardio: regular rate and rhythm, S1, S2 normal, no murmur, click, rub or gallop GI: soft, non-tender; bowel sounds normal; no masses,  no organomegaly Extremities: She still has 1+ edema.  Lab Results:  Results for orders placed during the hospital encounter of 12/06/13 (from the past 48 hour(s))  GLUCOSE, CAPILLARY     Status: Abnormal   Collection Time    12/12/13 11:34 AM      Result Value Ref Range   Glucose-Capillary 131 (*) 70 - 99 mg/dL  GLUCOSE, CAPILLARY     Status: None   Collection Time    12/12/13  4:32 PM      Result Value Ref Range   Glucose-Capillary 95  70 - 99 mg/dL   Comment 1 Notify RN     Comment 2 Documented in Chart    GLUCOSE, CAPILLARY     Status: Abnormal   Collection Time    12/12/13  8:23 PM      Result Value Ref Range   Glucose-Capillary 124 (*) 70 - 99 mg/dL   Comment 1 Notify RN     Comment 2 Documented in Chart    BASIC METABOLIC PANEL     Status: Abnormal   Collection Time    12/13/13  5:55 AM      Result Value Ref Range   Sodium 140  137 - 147 mEq/L   Potassium 4.0  3.7 - 5.3 mEq/L   Chloride 101  96 - 112 mEq/L   CO2 30  19 - 32 mEq/L   Glucose, Bld 104 (*) 70 - 99 mg/dL   BUN 40 (*) 6 - 23 mg/dL   Creatinine, Ser 1.22 (*) 0.50 - 1.10 mg/dL   Calcium 8.7  8.4 -  10.5 mg/dL   GFR calc non Af Amer 49 (*) >90 mL/min   GFR calc Af Amer 56 (*) >90 mL/min   Comment: (NOTE)     The eGFR has been calculated using the CKD EPI equation.     This calculation has not been validated in all clinical situations.     eGFR's persistently <90 mL/min signify possible Chronic Kidney     Disease.  GLUCOSE, CAPILLARY     Status: Abnormal   Collection Time    12/13/13  7:46 AM      Result Value Ref Range   Glucose-Capillary 115 (*) 70 - 99 mg/dL   Comment 1 Notify RN    GLUCOSE, CAPILLARY     Status: Abnormal   Collection Time    12/13/13 11:42 AM      Result Value Ref Range   Glucose-Capillary 131 (*) 70 - 99 mg/dL   Comment 1 Notify RN    GLUCOSE, CAPILLARY     Status: None   Collection Time  12/13/13  4:33 PM      Result Value Ref Range   Glucose-Capillary 97  70 - 99 mg/dL   Comment 1 Notify RN     Comment 2 Documented in Chart    GLUCOSE, CAPILLARY     Status: Abnormal   Collection Time    12/13/13  8:49 PM      Result Value Ref Range   Glucose-Capillary 106 (*) 70 - 99 mg/dL  GLUCOSE, CAPILLARY     Status: None   Collection Time    12/14/13  7:20 AM      Result Value Ref Range   Glucose-Capillary 91  70 - 99 mg/dL    ABGS No results found for this basename: PHART, PCO2, PO2ART, TCO2, HCO3,  in the last 72 hours CULTURES No results found for this or any previous visit (from the past 240 hour(s)). Studies/Results: No results found.  Medications:  Prior to Admission:  Prescriptions prior to admission  Medication Sig Dispense Refill  . clonazePAM (KLONOPIN) 1 MG tablet Take 1 mg by mouth 4 (four) times daily as needed for anxiety.      . DULoxetine (CYMBALTA) 20 MG capsule Take 1 capsule (20 mg total) by mouth daily.  30 capsule  3  . furosemide (LASIX) 40 MG tablet Take 1 tablet (40 mg total) by mouth daily.  30 tablet  1  . HYDROcodone-acetaminophen (NORCO/VICODIN) 5-325 MG per tablet Take 1 tablet by mouth every 4 (four) hours as needed  for moderate pain.      Marland Kitchen levothyroxine (SYNTHROID, LEVOTHROID) 200 MCG tablet Take 200 mcg by mouth daily before breakfast.      . lisinopril (PRINIVIL,ZESTRIL) 5 MG tablet Take 1 tablet (5 mg total) by mouth daily.  30 tablet  1  . PARoxetine (PAXIL) 20 MG tablet Take 20 mg by mouth every morning.      . potassium chloride SA (K-DUR,KLOR-CON) 20 MEQ tablet Take 40 mEq by mouth daily.      . promethazine (PHENERGAN) 25 MG tablet Take 25 mg by mouth every 6 (six) hours as needed for nausea or vomiting.      . simvastatin (ZOCOR) 40 MG tablet Take 40 mg by mouth every evening.      . temazepam (RESTORIL) 15 MG capsule Take 15 mg by mouth at bedtime as needed for sleep.      . insulin lispro (HUMALOG) 100 UNIT/ML injection Inject 2-10 Units into the skin 3 (three) times daily before meals. SS       Scheduled: . antiseptic oral rinse  15 mL Mouth Rinse BID  . aspirin EC  81 mg Oral Daily  . docusate sodium  100 mg Oral BID  . DULoxetine  20 mg Oral Daily  . folic acid  1 mg Oral Daily  . heparin  5,000 Units Subcutaneous 3 times per day  . hydrocortisone  25 mg Rectal BID  . insulin aspart  0-20 Units Subcutaneous TID WC  . insulin aspart  4 Units Subcutaneous TID WC  . ipratropium-albuterol  3 mL Nebulization TID  . levothyroxine  200 mcg Oral QAC breakfast  . multivitamin with minerals  1 tablet Oral Daily  . PARoxetine  20 mg Oral BH-q7a  . simvastatin  40 mg Oral QPM  . sodium chloride  3 mL Intravenous Q12H  . sodium chloride  3 mL Intravenous Q12H  . thiamine  100 mg Oral Daily  . torsemide  20 mg Oral Daily   Continuous:  RSH:[id number], acetaminophen, albuterol, alum & mag hydroxide-simeth, bisacodyl, chlorpheniramine-HYDROcodone, clonazePAM, HYDROcodone-acetaminophen, morphine injection, ondansetron (ZOFRAN) IV, ondansetron, polyethylene glycol, promethazine, sodium chloride, temazepam  Assesment: She was admitted with what appeared to be diastolic heart failure. Her  systolic heart function is normal. She was treated with diuretics but suffered acute kidney injury. Her renal function has now returned to baseline. She has morbid obesity and obesity hypoventilation. I think she's a maximum hospital benefit. Active Problems:   Obesity, unspecified   Type II or unspecified type diabetes mellitus without mention of complication, uncontrolled   Anemia   Unspecified hypothyroidism   Acute CHF (congestive heart failure)   CHF (congestive heart failure)    Plan: Discharge home. She needs extensive home health services. She is high risk for return    LOS: 8 days   Ehsan Corvin L 12/14/2013, 8:13 AM

## 2013-12-14 NOTE — Progress Notes (Signed)
Patient IV cath removed and intact. No pain/swelling at site. Verbalizes understanding. Bariatric walker given to patient and family. No c/o pain at this time.

## 2013-12-14 NOTE — Discharge Summary (Signed)
Physician Discharge Summary  Patient ID: [name] Green MRN: [id number] DOB/AGE: 56-Jul-1957 56 y.o. Primary Care Physician:FANTA,TESFAYE, MD Admit date: 12/06/2013 Discharge date: 12/14/2013    Discharge Diagnoses:   Active Problems:   Obesity, unspecified   Type II or unspecified type diabetes mellitus without mention of complication, uncontrolled   Anemia   Unspecified hypothyroidism   Acute CHF (congestive heart failure)   CHF (congestive heart failure)     Medication List         albuterol (2.5 MG/3ML) 0.083% nebulizer solution  Commonly known as:  PROVENTIL  Take 3 mLs (2.5 mg total) by nebulization every 4 (four) hours as needed for wheezing or shortness of breath.     aspirin 81 MG EC tablet  Take 1 tablet (81 mg total) by mouth daily.     clonazePAM 1 MG tablet  Commonly known as:  KLONOPIN  Take 0.5 tablets (0.5 mg total) by mouth 4 (four) times daily as needed for anxiety.     DULoxetine 20 MG capsule  Commonly known as:  CYMBALTA  Take 1 capsule (20 mg total) by mouth daily.     folic acid 1 MG tablet  Commonly known as:  FOLVITE  Take 1 tablet (1 mg total) by mouth daily.     furosemide 40 MG tablet  Commonly known as:  LASIX  Take 1 tablet (40 mg total) by mouth daily.     HYDROcodone-acetaminophen 5-325 MG per tablet  Commonly known as:  NORCO/VICODIN  Take 1 tablet by mouth every 4 (four) hours as needed for moderate pain.     hydrocortisone 25 MG suppository  Commonly known as:  ANUSOL-HC  Place 1 suppository (25 mg total) rectally 2 (two) times daily.     insulin lispro 100 UNIT/ML injection  Commonly known as:  HUMALOG  Inject 2-10 Units into the skin 3 (three) times daily before meals. SS     levothyroxine 200 MCG tablet  Commonly known as:  SYNTHROID, LEVOTHROID  Take 200 mcg by mouth daily before breakfast.     lisinopril 5 MG tablet  Commonly known as:  PRINIVIL,ZESTRIL  Take 1 tablet (5 mg total) by mouth daily.     PARoxetine 20  MG tablet  Commonly known as:  PAXIL  Take 20 mg by mouth every morning.     potassium chloride SA 20 MEQ tablet  Commonly known as:  K-DUR,KLOR-CON  Take 40 mEq by mouth daily.     promethazine 25 MG tablet  Commonly known as:  PHENERGAN  Take 25 mg by mouth every 6 (six) hours as needed for nausea or vomiting.     simvastatin 40 MG tablet  Commonly known as:  ZOCOR  Take 40 mg by mouth every evening.     temazepam 15 MG capsule  Commonly known as:  RESTORIL  Take 15 mg by mouth at bedtime as needed for sleep.        Discharged Condition: Improved    Consults: Nephrology  Significant Diagnostic Studies: US Renal  12/09/2013   CLINICAL DATA:  Elevated creatinine.  EXAM: RENAL/URINARY TRACT ULTRASOUND COMPLETE  COMPARISON:  None.  FINDINGS: Right Kidney:  The right kidney cannot be visualized.  Left Kidney:  The left kidney cannot be visualized.  Bladder:  Decompressed by Foley catheter.  IMPRESSION: The kidneys cannot be visualized. CT can be performed to further delineate if desired.   Electronically Signed   By: Maryclare Bean M.D.   On: 12/09/2013 12:44  Dg Chest Port 1 View  12/06/2013   CLINICAL DATA:  Shortness of breath, history COPD, diabetes, hypertension, CHF  EXAM: PORTABLE CHEST - 1 VIEW  COMPARISON:  Portable exam 1341 hr compared to 01/10/2013  FINDINGS: Enlargement of cardiac silhouette with pulmonary vascular congestion.  Mild perihilar infiltrates likely pulmonary edema and CHF.  No segmental consolidation, pleural effusion or pneumothorax.  Bones demineralized.  IMPRESSION: Mild CHF.   Electronically Signed   By: Ulyses SouthwardMark  Boles M.D.   On: 12/06/2013 13:54   Dg Knee Right Port  12/08/2013   CLINICAL DATA:  Right knee pain.  EXAM: PORTABLE RIGHT KNEE - 1-2 VIEW  COMPARISON:  None.  FINDINGS: Mild tricompartment degenerative changes with joint space narrowing and spurring, most pronounced in the patellofemoral compartment. No acute bony abnormality. Specifically, no  fracture, subluxation, or dislocation. Soft tissues are intact. No joint effusion.  IMPRESSION: Mild tricompartment degenerative changes. No acute bony abnormality.   Electronically Signed   By: Charlett NoseKevin  Dover M.D.   On: 12/08/2013 15:16    Lab Results: Basic Metabolic Panel:  Recent Labs  81/19/1406/30/15 0614 12/13/13 0555  NA 138 140  K 3.9 4.0  CL 99 101  CO2 31 30  GLUCOSE 105* 104*  BUN 48* 40*  CREATININE 1.63* 1.22*  CALCIUM 8.3* 8.7   Liver Function Tests: No results found for this basename: AST, ALT, ALKPHOS, BILITOT, PROT, ALBUMIN,  in the last 72 hours   CBC: No results found for this basename: WBC, NEUTROABS, HGB, HCT, MCV, PLT,  in the last 72 hours  No results found for this or any previous visit (from the past 240 hour(s)).   Hospital Course: This is a 58 are old who came to the hospital because of increasing shortness of breath. She was edematous and was treated for acute congestive heart failure. Echocardiogram showed what appeared to be normal or near normal systolic function so this is felt to be diastolic in nature. She was being diuresed when she developed acute kidney injury and required nephrology consultation. She was treated and improved. By the time of discharge her renal function was back to baseline with a creatinine of approximately 1.2. Her shortness of breath was better. She still had some edema in her lower extremities. She is on oxygen. She is morbidly obese and probably has some element of obesity hypoventilation as well. It was felt that she had somnolence related to some of her medications and her medications were reduced and she was more alert. As part of her workup for her kidney disease she was found to have positive ANA and she's felt to need outpatient rheumatology workup  Discharge Exam: Blood pressure 133/74, pulse 98, temperature 97.6 F (36.4 C), temperature source Oral, resp. rate 20, height 5\' 6"  (1.676 m), weight 152.8 kg (336 lb 13.8 oz), SpO2  95.00%. She is morbidly obese. Her chest is relatively clear. Her heart is regular. Her abdomen is soft. She has 1+ edema of the lower extremities  Disposition: Home with home health services. At baseline she is in poor health and because of that I think she is high risk for return. She will followup with Dr. Sudie BaileyKnowlton her primary care physician. I assumed her care midway through her hospital course when her attending physician on vacation      Discharge Instructions   DME Nebulizer machine    Complete by:  As directed      DME Nebulizer/meds    Complete by:  As directed  Discharge patient    Complete by:  As directed      Face-to-face encounter (required for Medicare/Medicaid patients)    Complete by:  As directed   I Carrell Palmatier L certify that this patient is under my care and that I, or a nurse practitioner or physician's assistant working with me, had a face-to-face encounter that meets the physician face-to-face encounter requirements with this patient on 12/14/2013. The encounter with the patient was in whole, or in part for the following medical condition(s) which is the primary reason for home health care (List medical condition): CHF/acute renal failure  The encounter with the patient was in whole, or in part, for the following medical condition, which is the primary reason for home health care:  CHF/acute renal failure  I certify that, based on my findings, the following services are medically necessary home health services:   Nursing Physical therapy    My clinical findings support the need for the above services:  Shortness of breath with activity  Further, I certify that my clinical findings support that this patient is homebound due to:  Shortness of Breath with activity  Reason for Medically Necessary Home Health Services:  Skilled Nursing- Change/Decline in Patient Status     Home Health    Complete by:  As directed   To provide the following care/treatments:   PT RN     She needs basic metabolic profile on 12/18/2013. Please call to Dr. Sudie BaileyKnowlton           Follow-up Information   Follow up with [facility].   Contact information:   [address] [place] [address] [phone]       Signed: Fredirick MaudlinHAWKINS,Jyssica Rief L   12/14/2013, 8:57 AM

## 2013-12-14 NOTE — Progress Notes (Signed)
UR chart review completed.  

## 2014-01-06 ENCOUNTER — Encounter (HOSPITAL_COMMUNITY): Payer: Self-pay | Admitting: Emergency Medicine

## 2014-01-06 ENCOUNTER — Inpatient Hospital Stay (HOSPITAL_COMMUNITY)
Admission: EM | Admit: 2014-01-06 | Discharge: 2014-01-22 | DRG: 377 | Disposition: A | Discharge status: Home Health | Payer: Medicaid Other | Attending: Internal Medicine | Admitting: Internal Medicine

## 2014-01-06 ENCOUNTER — Emergency Department (HOSPITAL_COMMUNITY): Payer: Medicaid Other

## 2014-01-06 DIAGNOSIS — D62 Acute posthemorrhagic anemia: Secondary | ICD-10-CM | POA: Diagnosis present

## 2014-01-06 DIAGNOSIS — E785 Hyperlipidemia, unspecified: Secondary | ICD-10-CM | POA: Diagnosis present

## 2014-01-06 DIAGNOSIS — E669 Obesity, unspecified: Secondary | ICD-10-CM

## 2014-01-06 DIAGNOSIS — G92 Toxic encephalopathy: Secondary | ICD-10-CM | POA: Diagnosis present

## 2014-01-06 DIAGNOSIS — K2971 Gastritis, unspecified, with bleeding: Principal | ICD-10-CM | POA: Diagnosis present

## 2014-01-06 DIAGNOSIS — K3182 Dieulafoy lesion (hemorrhagic) of stomach and duodenum: Secondary | ICD-10-CM | POA: Diagnosis present

## 2014-01-06 DIAGNOSIS — R0601 Orthopnea: Secondary | ICD-10-CM

## 2014-01-06 DIAGNOSIS — E875 Hyperkalemia: Secondary | ICD-10-CM | POA: Diagnosis present

## 2014-01-06 DIAGNOSIS — D500 Iron deficiency anemia secondary to blood loss (chronic): Secondary | ICD-10-CM | POA: Diagnosis present

## 2014-01-06 DIAGNOSIS — J4489 Other specified chronic obstructive pulmonary disease: Secondary | ICD-10-CM | POA: Diagnosis present

## 2014-01-06 DIAGNOSIS — F17201 Nicotine dependence, unspecified, in remission: Secondary | ICD-10-CM

## 2014-01-06 DIAGNOSIS — Z79899 Other long term (current) drug therapy: Secondary | ICD-10-CM

## 2014-01-06 DIAGNOSIS — E119 Type 2 diabetes mellitus without complications: Secondary | ICD-10-CM | POA: Diagnosis present

## 2014-01-06 DIAGNOSIS — E872 Acidosis, unspecified: Secondary | ICD-10-CM | POA: Diagnosis present

## 2014-01-06 DIAGNOSIS — G8929 Other chronic pain: Secondary | ICD-10-CM | POA: Diagnosis present

## 2014-01-06 DIAGNOSIS — R0609 Other forms of dyspnea: Secondary | ICD-10-CM

## 2014-01-06 DIAGNOSIS — Z87891 Personal history of nicotine dependence: Secondary | ICD-10-CM

## 2014-01-06 DIAGNOSIS — I509 Heart failure, unspecified: Secondary | ICD-10-CM | POA: Diagnosis present

## 2014-01-06 DIAGNOSIS — I5033 Acute on chronic diastolic (congestive) heart failure: Secondary | ICD-10-CM | POA: Diagnosis present

## 2014-01-06 DIAGNOSIS — R0600 Dyspnea, unspecified: Secondary | ICD-10-CM

## 2014-01-06 DIAGNOSIS — Z880 Allergy status to penicillin: Secondary | ICD-10-CM | POA: Diagnosis not present

## 2014-01-06 DIAGNOSIS — Z794 Long term (current) use of insulin: Secondary | ICD-10-CM | POA: Diagnosis not present

## 2014-01-06 DIAGNOSIS — E662 Morbid (severe) obesity with alveolar hypoventilation: Secondary | ICD-10-CM | POA: Diagnosis present

## 2014-01-06 DIAGNOSIS — K5900 Constipation, unspecified: Secondary | ICD-10-CM | POA: Diagnosis present

## 2014-01-06 DIAGNOSIS — Z7982 Long term (current) use of aspirin: Secondary | ICD-10-CM | POA: Diagnosis not present

## 2014-01-06 DIAGNOSIS — B961 Klebsiella pneumoniae [K. pneumoniae] as the cause of diseases classified elsewhere: Secondary | ICD-10-CM | POA: Diagnosis present

## 2014-01-06 DIAGNOSIS — T502X5A Adverse effect of carbonic-anhydrase inhibitors, benzothiadiazides and other diuretics, initial encounter: Secondary | ICD-10-CM | POA: Diagnosis present

## 2014-01-06 DIAGNOSIS — E1165 Type 2 diabetes mellitus with hyperglycemia: Secondary | ICD-10-CM

## 2014-01-06 DIAGNOSIS — J449 Chronic obstructive pulmonary disease, unspecified: Secondary | ICD-10-CM | POA: Diagnosis present

## 2014-01-06 DIAGNOSIS — R0989 Other specified symptoms and signs involving the circulatory and respiratory systems: Secondary | ICD-10-CM

## 2014-01-06 DIAGNOSIS — E8881 Metabolic syndrome: Secondary | ICD-10-CM

## 2014-01-06 DIAGNOSIS — K2991 Gastroduodenitis, unspecified, with bleeding: Principal | ICD-10-CM

## 2014-01-06 DIAGNOSIS — I2510 Atherosclerotic heart disease of native coronary artery without angina pectoris: Secondary | ICD-10-CM | POA: Diagnosis present

## 2014-01-06 DIAGNOSIS — N179 Acute kidney failure, unspecified: Secondary | ICD-10-CM | POA: Diagnosis present

## 2014-01-06 DIAGNOSIS — N183 Chronic kidney disease, stage 3 unspecified: Secondary | ICD-10-CM | POA: Diagnosis present

## 2014-01-06 DIAGNOSIS — G929 Unspecified toxic encephalopathy: Secondary | ICD-10-CM | POA: Diagnosis present

## 2014-01-06 DIAGNOSIS — R05 Cough: Secondary | ICD-10-CM

## 2014-01-06 DIAGNOSIS — I129 Hypertensive chronic kidney disease with stage 1 through stage 4 chronic kidney disease, or unspecified chronic kidney disease: Secondary | ICD-10-CM | POA: Diagnosis present

## 2014-01-06 DIAGNOSIS — K921 Melena: Secondary | ICD-10-CM | POA: Diagnosis present

## 2014-01-06 DIAGNOSIS — R740 Nonspecific elevation of levels of transaminase and lactic acid dehydrogenase [LDH]: Secondary | ICD-10-CM

## 2014-01-06 DIAGNOSIS — I5031 Acute diastolic (congestive) heart failure: Secondary | ICD-10-CM

## 2014-01-06 DIAGNOSIS — R195 Other fecal abnormalities: Secondary | ICD-10-CM

## 2014-01-06 DIAGNOSIS — N390 Urinary tract infection, site not specified: Secondary | ICD-10-CM | POA: Diagnosis present

## 2014-01-06 DIAGNOSIS — K644 Residual hemorrhoidal skin tags: Secondary | ICD-10-CM

## 2014-01-06 DIAGNOSIS — F341 Dysthymic disorder: Secondary | ICD-10-CM

## 2014-01-06 DIAGNOSIS — Z9981 Dependence on supplemental oxygen: Secondary | ICD-10-CM | POA: Diagnosis not present

## 2014-01-06 DIAGNOSIS — R609 Edema, unspecified: Secondary | ICD-10-CM

## 2014-01-06 DIAGNOSIS — N189 Chronic kidney disease, unspecified: Secondary | ICD-10-CM | POA: Diagnosis present

## 2014-01-06 DIAGNOSIS — R7401 Elevation of levels of liver transaminase levels: Secondary | ICD-10-CM

## 2014-01-06 DIAGNOSIS — E039 Hypothyroidism, unspecified: Secondary | ICD-10-CM | POA: Diagnosis present

## 2014-01-06 DIAGNOSIS — J9620 Acute and chronic respiratory failure, unspecified whether with hypoxia or hypercapnia: Secondary | ICD-10-CM | POA: Diagnosis not present

## 2014-01-06 DIAGNOSIS — R600 Localized edema: Secondary | ICD-10-CM

## 2014-01-06 DIAGNOSIS — Z6841 Body Mass Index (BMI) 40.0 and over, adult: Secondary | ICD-10-CM

## 2014-01-06 DIAGNOSIS — D631 Anemia in chronic kidney disease: Secondary | ICD-10-CM | POA: Diagnosis present

## 2014-01-06 DIAGNOSIS — K2980 Duodenitis without bleeding: Secondary | ICD-10-CM | POA: Diagnosis present

## 2014-01-06 DIAGNOSIS — K5710 Diverticulosis of small intestine without perforation or abscess without bleeding: Secondary | ICD-10-CM | POA: Diagnosis present

## 2014-01-06 DIAGNOSIS — K625 Hemorrhage of anus and rectum: Secondary | ICD-10-CM

## 2014-01-06 DIAGNOSIS — R053 Chronic cough: Secondary | ICD-10-CM

## 2014-01-06 DIAGNOSIS — D649 Anemia, unspecified: Secondary | ICD-10-CM | POA: Diagnosis present

## 2014-01-06 DIAGNOSIS — IMO0001 Reserved for inherently not codable concepts without codable children: Secondary | ICD-10-CM

## 2014-01-06 DIAGNOSIS — D538 Other specified nutritional anemias: Secondary | ICD-10-CM

## 2014-01-06 DIAGNOSIS — N039 Chronic nephritic syndrome with unspecified morphologic changes: Secondary | ICD-10-CM

## 2014-01-06 DIAGNOSIS — F418 Other specified anxiety disorders: Secondary | ICD-10-CM

## 2014-01-06 HISTORY — DX: Cough: R05

## 2014-01-06 HISTORY — DX: Chronic cough: R05.3

## 2014-01-06 HISTORY — DX: Unspecified hemorrhoids: K64.9

## 2014-01-06 HISTORY — DX: Edema, unspecified: R60.9

## 2014-01-06 HISTORY — DX: Localized edema: R60.0

## 2014-01-06 HISTORY — DX: Dependence on supplemental oxygen: Z99.81

## 2014-01-06 LAB — PRO B NATRIURETIC PEPTIDE: Pro B Natriuretic peptide (BNP): 1142 pg/mL — ABNORMAL HIGH (ref 0–125)

## 2014-01-06 LAB — BASIC METABOLIC PANEL
Anion gap: 7 (ref 5–15)
BUN: 19 mg/dL (ref 6–23)
CALCIUM: 8.6 mg/dL (ref 8.4–10.5)
CO2: 37 mEq/L — ABNORMAL HIGH (ref 19–32)
CREATININE: 1.08 mg/dL (ref 0.50–1.10)
Chloride: 97 mEq/L (ref 96–112)
GFR calc Af Amer: 65 mL/min — ABNORMAL LOW (ref >90)
GFR, EST NON AFRICAN AMERICAN: 56 mL/min — AB (ref >90)
GLUCOSE: 122 mg/dL — AB (ref 70–99)
Potassium: 4.2 mEq/L (ref 3.7–5.3)
SODIUM: 141 meq/L (ref 137–147)

## 2014-01-06 LAB — BLOOD GAS, ARTERIAL
Acid-Base Excess: 11.3 mmol/L — ABNORMAL HIGH (ref 0.0–2.0)
BICARBONATE: 37.2 meq/L — AB (ref 20.0–24.0)
Drawn by: 23534
O2 Content: 3.5 L/min
O2 Saturation: 97.3 %
PATIENT TEMPERATURE: 37
PH ART: 7.348 — AB (ref 7.350–7.450)
TCO2: 36.7 mmol/L (ref 0–100)
pCO2 arterial: 69.5 mmHg (ref 35.0–45.0)
pO2, Arterial: 91.4 mmHg (ref 80.0–100.0)

## 2014-01-06 LAB — CBC WITH DIFFERENTIAL/PLATELET
BASOS PCT: 0 % (ref 0–1)
Basophils Absolute: 0 10*3/uL (ref 0.0–0.1)
EOS ABS: 0.2 10*3/uL (ref 0.0–0.7)
Eosinophils Relative: 2 % (ref 0–5)
HCT: 20.9 % — ABNORMAL LOW (ref 36.0–46.0)
Hemoglobin: 6.1 g/dL — CL (ref 12.0–15.0)
LYMPHS ABS: 0.9 10*3/uL (ref 0.7–4.0)
Lymphocytes Relative: 9 % — ABNORMAL LOW (ref 12–46)
MCH: 30.3 pg (ref 26.0–34.0)
MCHC: 29.2 g/dL — AB (ref 30.0–36.0)
MCV: 104 fL — AB (ref 78.0–100.0)
MONO ABS: 1 10*3/uL (ref 0.1–1.0)
Monocytes Relative: 10 % (ref 3–12)
Neutro Abs: 7.7 10*3/uL (ref 1.7–7.7)
Neutrophils Relative %: 79 % — ABNORMAL HIGH (ref 43–77)
PLATELETS: 178 10*3/uL (ref 150–400)
RBC: 2.01 MIL/uL — ABNORMAL LOW (ref 3.87–5.11)
RDW: 21.5 % — ABNORMAL HIGH (ref 11.5–15.5)
WBC: 9.8 10*3/uL (ref 4.0–10.5)

## 2014-01-06 LAB — URINE MICROSCOPIC-ADD ON

## 2014-01-06 LAB — GLUCOSE, CAPILLARY: Glucose-Capillary: 124 mg/dL — ABNORMAL HIGH (ref 70–99)

## 2014-01-06 LAB — URINALYSIS, ROUTINE W REFLEX MICROSCOPIC
Bilirubin Urine: NEGATIVE
Glucose, UA: NEGATIVE mg/dL
KETONES UR: NEGATIVE mg/dL
NITRITE: POSITIVE — AB
Protein, ur: NEGATIVE mg/dL
SPECIFIC GRAVITY, URINE: 1.02 (ref 1.005–1.030)
UROBILINOGEN UA: 1 mg/dL (ref 0.0–1.0)
pH: 5.5 (ref 5.0–8.0)

## 2014-01-06 LAB — POC OCCULT BLOOD, ED: Fecal Occult Bld: POSITIVE — AB

## 2014-01-06 LAB — MRSA PCR SCREENING: MRSA by PCR: POSITIVE — AB

## 2014-01-06 LAB — ABO/RH: ABO/RH(D): O NEG

## 2014-01-06 LAB — PREPARE RBC (CROSSMATCH)

## 2014-01-06 LAB — CBG MONITORING, ED: Glucose-Capillary: 125 mg/dL — ABNORMAL HIGH (ref 70–99)

## 2014-01-06 LAB — TROPONIN I

## 2014-01-06 MED ORDER — FUROSEMIDE 10 MG/ML IJ SOLN
20.0000 mg | Freq: Two times a day (BID) | INTRAMUSCULAR | Status: DC
  Administered 2014-01-06 – 2014-01-12 (×13): 20 mg via INTRAVENOUS
  Filled 2014-01-06 (×13): qty 2

## 2014-01-06 MED ORDER — TEMAZEPAM 15 MG PO CAPS: 15.0000 mg | ORAL_CAPSULE | Freq: Every evening | ORAL | Status: DC | PRN

## 2014-01-06 MED ORDER — POTASSIUM CHLORIDE CRYS ER 20 MEQ PO TBCR
40.0000 meq | EXTENDED_RELEASE_TABLET | Freq: Every day | ORAL | Status: DC
  Administered 2014-01-06 – 2014-01-09 (×4): 40 meq via ORAL
  Filled 2014-01-06 (×4): qty 2

## 2014-01-06 MED ORDER — SIMVASTATIN 20 MG PO TABS
40.0000 mg | ORAL_TABLET | Freq: Every morning | ORAL | Status: DC
  Administered 2014-01-06 – 2014-01-21 (×15): 40 mg via ORAL
  Filled 2014-01-06 (×7): qty 2
  Filled 2014-01-06: qty 1
  Filled 2014-01-06 (×4): qty 2
  Filled 2014-01-06: qty 1
  Filled 2014-01-06 (×4): qty 2
  Filled 2014-01-06: qty 1
  Filled 2014-01-06 (×2): qty 2

## 2014-01-06 MED ORDER — FOLIC ACID 1 MG PO TABS
1.0000 mg | ORAL_TABLET | Freq: Every day | ORAL | Status: DC
  Administered 2014-01-06 – 2014-01-22 (×16): 1 mg via ORAL
  Filled 2014-01-06 (×17): qty 1

## 2014-01-06 MED ORDER — DULOXETINE HCL 20 MG PO CPEP
20.0000 mg | ORAL_CAPSULE | Freq: Every day | ORAL | Status: DC
  Administered 2014-01-07 – 2014-01-21 (×14): 20 mg via ORAL
  Filled 2014-01-06 (×19): qty 1

## 2014-01-06 MED ORDER — CHLORHEXIDINE GLUCONATE CLOTH 2 % EX PADS
6.0000 | MEDICATED_PAD | Freq: Every day | CUTANEOUS | Status: AC
Start: 2014-01-07 — End: 2014-01-11
  Administered 2014-01-07 – 2014-01-11 (×5): 6 via TOPICAL

## 2014-01-06 MED ORDER — CIPROFLOXACIN IN D5W 400 MG/200ML IV SOLN: 400.0000 mg | Freq: Once | INTRAVENOUS | Status: DC

## 2014-01-06 MED ORDER — CIPROFLOXACIN IN D5W 400 MG/200ML IV SOLN
400.0000 mg | Freq: Once | INTRAVENOUS | Status: AC
  Administered 2014-01-06: 400 mg via INTRAVENOUS
  Filled 2014-01-06: qty 200

## 2014-01-06 MED ORDER — HYDROCODONE-ACETAMINOPHEN 5-325 MG PO TABS
1.0000 | ORAL_TABLET | ORAL | Status: DC | PRN
  Administered 2014-01-07 – 2014-01-21 (×18): 1 via ORAL
  Filled 2014-01-06 (×19): qty 1

## 2014-01-06 MED ORDER — SODIUM CHLORIDE 0.9 % IV SOLN
INTRAVENOUS | Status: DC
  Administered 2014-01-06 – 2014-01-12 (×4): via INTRAVENOUS

## 2014-01-06 MED ORDER — ASPIRIN EC 81 MG PO TBEC
81.0000 mg | DELAYED_RELEASE_TABLET | Freq: Every day | ORAL | Status: DC
  Administered 2014-01-07 – 2014-01-14 (×7): 81 mg via ORAL
  Filled 2014-01-06 (×9): qty 1

## 2014-01-06 MED ORDER — PAROXETINE HCL 20 MG PO TABS
20.0000 mg | ORAL_TABLET | Freq: Every day | ORAL | Status: DC
  Administered 2014-01-07 – 2014-01-22 (×15): 20 mg via ORAL
  Filled 2014-01-06 (×18): qty 1

## 2014-01-06 MED ORDER — FUROSEMIDE 10 MG/ML IJ SOLN: 40.0000 mg | Freq: Once | INTRAMUSCULAR | Status: DC

## 2014-01-06 MED ORDER — INSULIN ASPART 100 UNIT/ML ~~LOC~~ SOLN
0.0000 [IU] | Freq: Three times a day (TID) | SUBCUTANEOUS | Status: DC
  Administered 2014-01-07 – 2014-01-08 (×3): 3 [IU] via SUBCUTANEOUS
  Administered 2014-01-12 – 2014-01-13 (×2): 4 [IU] via SUBCUTANEOUS
  Administered 2014-01-13: 3 [IU] via SUBCUTANEOUS
  Administered 2014-01-13 – 2014-01-14 (×2): 4 [IU] via SUBCUTANEOUS
  Administered 2014-01-14 – 2014-01-16 (×5): 3 [IU] via SUBCUTANEOUS
  Administered 2014-01-16 – 2014-01-17 (×4): 4 [IU] via SUBCUTANEOUS
  Administered 2014-01-17 – 2014-01-18 (×2): 3 [IU] via SUBCUTANEOUS
  Administered 2014-01-18: 4 [IU] via SUBCUTANEOUS
  Administered 2014-01-19: 3 [IU] via SUBCUTANEOUS
  Administered 2014-01-19: 4 [IU] via SUBCUTANEOUS
  Administered 2014-01-19: 7 [IU] via SUBCUTANEOUS
  Administered 2014-01-20 – 2014-01-21 (×4): 4 [IU] via SUBCUTANEOUS
  Administered 2014-01-21: 3 [IU] via SUBCUTANEOUS

## 2014-01-06 MED ORDER — BIOTENE DRY MOUTH MT LIQD
15.0000 mL | Freq: Two times a day (BID) | OROMUCOSAL | Status: DC
  Administered 2014-01-07: 15 mL via OROMUCOSAL

## 2014-01-06 MED ORDER — FUROSEMIDE 10 MG/ML IJ SOLN
20.0000 mg | Freq: Once | INTRAMUSCULAR | Status: AC
  Administered 2014-01-06: 20 mg via INTRAVENOUS
  Filled 2014-01-06: qty 2

## 2014-01-06 MED ORDER — CLONAZEPAM 0.5 MG PO TABS
0.5000 mg | ORAL_TABLET | Freq: Three times a day (TID) | ORAL | Status: DC | PRN
  Administered 2014-01-08 – 2014-01-09 (×3): 0.5 mg via ORAL
  Filled 2014-01-06 (×4): qty 1

## 2014-01-06 MED ORDER — IPRATROPIUM-ALBUTEROL 0.5-2.5 (3) MG/3ML IN SOLN
3.0000 mL | Freq: Once | RESPIRATORY_TRACT | Status: AC
  Administered 2014-01-06: 3 mL via RESPIRATORY_TRACT
  Filled 2014-01-06: qty 3

## 2014-01-06 MED ORDER — NYSTATIN 100000 UNIT/GM EX POWD
Freq: Two times a day (BID) | CUTANEOUS | Status: DC
  Administered 2014-01-07 (×3): via TOPICAL
  Administered 2014-01-08: 1 g via TOPICAL
  Administered 2014-01-08 – 2014-01-11 (×6): via TOPICAL
  Administered 2014-01-12: 1 g via TOPICAL
  Administered 2014-01-12: 12:00:00 via TOPICAL
  Filled 2014-01-06: qty 15

## 2014-01-06 MED ORDER — MUPIROCIN 2 % EX OINT
1.0000 "application " | TOPICAL_OINTMENT | Freq: Two times a day (BID) | CUTANEOUS | Status: AC
  Administered 2014-01-07 – 2014-01-11 (×9): 1 via NASAL
  Filled 2014-01-06 (×3): qty 22

## 2014-01-06 MED ORDER — ALBUTEROL SULFATE (2.5 MG/3ML) 0.083% IN NEBU
2.5000 mg | INHALATION_SOLUTION | RESPIRATORY_TRACT | Status: DC | PRN
  Administered 2014-01-09 (×2): 2.5 mg via RESPIRATORY_TRACT
  Filled 2014-01-06 (×2): qty 3

## 2014-01-06 MED ORDER — ONDANSETRON HCL 4 MG PO TABS
4.0000 mg | ORAL_TABLET | Freq: Four times a day (QID) | ORAL | Status: DC | PRN
  Administered 2014-01-11: 4 mg via ORAL
  Filled 2014-01-06: qty 1

## 2014-01-06 MED ORDER — CHLORHEXIDINE GLUCONATE 0.12 % MT SOLN
15.0000 mL | Freq: Two times a day (BID) | OROMUCOSAL | Status: DC
  Administered 2014-01-06 – 2014-01-07 (×2): 15 mL via OROMUCOSAL
  Filled 2014-01-06 (×2): qty 15

## 2014-01-06 MED ORDER — LEVOTHYROXINE SODIUM 100 MCG PO TABS
200.0000 ug | ORAL_TABLET | Freq: Every day | ORAL | Status: DC
  Administered 2014-01-07 – 2014-01-22 (×15): 200 ug via ORAL
  Filled 2014-01-06 (×7): qty 2
  Filled 2014-01-06: qty 1
  Filled 2014-01-06 (×8): qty 2
  Filled 2014-01-06: qty 1
  Filled 2014-01-06 (×2): qty 2

## 2014-01-06 MED ORDER — CIPROFLOXACIN IN D5W 400 MG/200ML IV SOLN
400.0000 mg | Freq: Two times a day (BID) | INTRAVENOUS | Status: DC
  Administered 2014-01-07 – 2014-01-08 (×3): 400 mg via INTRAVENOUS
  Filled 2014-01-06 (×3): qty 200

## 2014-01-06 MED ORDER — ONDANSETRON HCL 4 MG/2ML IJ SOLN
4.0000 mg | Freq: Four times a day (QID) | INTRAMUSCULAR | Status: DC | PRN
  Administered 2014-01-21 – 2014-01-22 (×2): 4 mg via INTRAVENOUS
  Filled 2014-01-06 (×2): qty 2

## 2014-01-06 NOTE — Progress Notes (Signed)
PT is off BiPAP placed on 3lpm/[place] she is alert oriented no SOB. SaO2 is 94.

## 2014-01-06 NOTE — ED Notes (Signed)
Pt states that she is unable to sit up or stand for orthostatic vital signs.

## 2014-01-06 NOTE — ED Notes (Signed)
Pt arrived alert and oriented. Pt sent to triage.

## 2014-01-06 NOTE — H&P (Addendum)
PCP:   FANTA,TESFAYE, MD   Chief Complaint:  Lower extremity swelling  HPI:  58 year old female who  has a past medical history of COPD (chronic obstructive pulmonary disease); Diabetes mellitus without complication; Hypertension; Thyroid disease; Hyperlipemia; Neuropathy; Depression; Chronic pain; Anxiety; CHF (congestive heart failure); Hemorrhoids; Chronic cough; Peripheral edema; and On home O2. Was brought to the ED for constant cough and worsening lower extremity swelling for past 2 weeks. Patient was recently discharged from the hospital on 12/14/2013 after being treated for CHF exacerbation. Patient is a poor historian, and she states that she has gradually developed lower extremity swelling, cough and also has been having shortness of breath. Patient is on home oxygen at 2 L per minute and the caregivers had to increase the oxygen to 4 L per minute. She denies fever, dysuria, intermittent nausea vomiting. Had loose bowel movements today, admits to having seen blood in the stool. Denies chest pain, no orthopnea or PND. In the ED patient found to have anemia with hemoglobin 6.1, stool for occult blood positive. One unit of PRBC is being transfused. Also patient found to have elevated BNP 1142.0   Allergies:   Allergies  Allergen Reactions  . Codeine Nausea And Vomiting  . Keflex [Cephalexin] Hives and Nausea And Vomiting  . Penicillins Nausea And Vomiting      Past Medical History  Diagnosis Date  . COPD (chronic obstructive pulmonary disease)   . Diabetes mellitus without complication   . Hypertension   . Thyroid disease   . Hyperlipemia   . Neuropathy   . Depression   . Chronic pain   . Anxiety   . CHF (congestive heart failure)   . Hemorrhoids   . Chronic cough   . Peripheral edema   . On home O2     2L N/C    Past Surgical History  Procedure Laterality Date  . Cholecystectomy      Prior to Admission medications   Medication Sig Start Date End Date  Taking? Authorizing Provider  albuterol (PROVENTIL) (2.5 MG/3ML) 0.083% nebulizer solution Take 3 mLs (2.5 mg total) by nebulization every 4 (four) hours as needed for wheezing or shortness of breath. 12/14/13  Yes Fredirick Maudlin, MD  aspirin EC 81 MG EC tablet Take 1 tablet (81 mg total) by mouth daily. 12/14/13  Yes Fredirick Maudlin, MD  clonazePAM (KLONOPIN) 1 MG tablet Take 0.5 tablets (0.5 mg total) by mouth 4 (four) times daily as needed for anxiety. 12/14/13  Yes Fredirick Maudlin, MD  DULoxetine (CYMBALTA) 20 MG capsule Take 1 capsule (20 mg total) by mouth daily. 01/05/13  Yes Gwenyth Bender, NP  folic acid (FOLVITE) 1 MG tablet Take 1 tablet (1 mg total) by mouth daily. 12/14/13  Yes Fredirick Maudlin, MD  furosemide (LASIX) 40 MG tablet Take 1 tablet (40 mg total) by mouth daily. 01/05/13  Yes Gwenyth Bender, NP  HYDROcodone-acetaminophen (NORCO/VICODIN) 5-325 MG per tablet Take 1 tablet by mouth every 4 (four) hours as needed for moderate pain.   Yes Historical Provider, MD  insulin lispro (HUMALOG) 100 UNIT/ML injection Inject 2-10 Units into the skin 3 (three) times daily before meals. SS   Yes Historical Provider, MD  levothyroxine (SYNTHROID, LEVOTHROID) 200 MCG tablet Take 200 mcg by mouth daily before breakfast.   Yes Historical Provider, MD  lisinopril (PRINIVIL,ZESTRIL) 5 MG tablet Take 1 tablet (5 mg total) by mouth daily. 01/05/13  Yes Gwenyth Bender, NP  PARoxetine (  PAXIL) 20 MG tablet Take 20 mg by mouth every morning.   Yes Historical Provider, MD  potassium chloride SA (K-DUR,KLOR-CON) 20 MEQ tablet Take 40 mEq by mouth daily.   Yes Historical Provider, MD  promethazine (PHENERGAN) 25 MG tablet Take 25 mg by mouth every 6 (six) hours as needed for nausea or vomiting.   Yes Historical Provider, MD  simvastatin (ZOCOR) 40 MG tablet Take 40 mg by mouth every morning.    Yes Historical Provider, MD  temazepam (RESTORIL) 15 MG capsule Take 15 mg by mouth at bedtime as needed for sleep.   Yes  Historical Provider, MD    Social History:  reports that she has quit smoking. She does not have any smokeless tobacco history on file. She reports that she does not drink alcohol or use illicit drugs.    All the positives are listed in BOLD  Review of Systems:  HEENT: Headache, blurred vision, runny nose, sore throat Neck: Hypothyroidism, hyperthyroidism,,lymphadenopathy Chest : Shortness of breath, history of COPD, Asthma Heart : Chest pain, history of coronary arterey disease GI:  Nausea, vomiting, diarrhea, constipation, GERD GU: Dysuria, urgency, frequency of urination, hematuria Neuro: Stroke, seizures, syncope Psych: Depression, anxiety, hallucinations   Physical Exam: Blood pressure 122/69, pulse 107, temperature 97.5 F (36.4 C), temperature source Rectal, resp. rate 23, SpO2 100.00%. Constitutional:   Patient is a morbidly obese female, disheveled in appearance, in mild respiratory distress and cooperative with exam. Head: Normocephalic and atraumatic Mouth: Mucus membranes moist Eyes: PERRL, EOMI, conjunctivae normal Neck: Supple, No Thyromegaly Cardiovascular: RRR, S1 normal, S2 normal Pulmonary/Chest: Decreased breath sounds bilaterally, bibasilar crackles Abdominal: Soft. Non-tender, non-distended, bowel sounds are normal, no masses, organomegaly, or guarding present.  Neurological: A&O x3, Strenght is normal and symmetric bilaterally, cranial nerve II-XII are grossly intact, no focal motor deficit, sensory intact to light touch bilaterally.  Extremities : Bilateral 2+ pitting edema of the lower extremities  Labs on Admission:  Basic Metabolic Panel:  Recent Labs Lab 01/06/14 1318  NA 141  K 4.2  CL 97  CO2 37*  GLUCOSE 122*  BUN 19  CREATININE 1.08  CALCIUM 8.6   Liver Function Tests: No results found for this basename: AST, ALT, ALKPHOS, BILITOT, PROT, ALBUMIN,  in the last 168 hours No results found for this basename: LIPASE, AMYLASE,  in the last  168 hours No results found for this basename: AMMONIA,  in the last 168 hours CBC:  Recent Labs Lab 01/06/14 1318  WBC 9.8  NEUTROABS 7.7  HGB 6.1*  HCT 20.9*  MCV 104.0*  PLT 178   Cardiac Enzymes:  Recent Labs Lab 01/06/14 1318  TROPONINI <0.30    BNP (last 3 results)  Recent Labs  12/06/13 1315 12/09/13 0552 01/06/14 1318  PROBNP 268.1* 275.1* 1142.0*   CBG:  Recent Labs Lab 01/06/14 1243  GLUCAP 125*    Radiological Exams on Admission: Dg Chest Portable 1 View  01/06/2014   CLINICAL DATA:  58 year old female with altered mental status and lethargy.  EXAM: PORTABLE CHEST - 1 VIEW  COMPARISON:  12/06/2013 and prior chest radiographs dating back to 01/03/2013  FINDINGS: Cardiomegaly and moderate pulmonary edema identified.  Bilateral pleural effusions noted, small to moderate on the right and small on the left.  There is no evidence of pneumothorax or acute bony abnormality.  IMPRESSION: Cardiomegaly with moderate pulmonary edema and bilateral pleural effusions.   Electronically Signed   By: Laveda Abbe M.D.   On: 01/06/2014 13:59  EKG: Independently reviewed. Normal sinus rhythm, with premature atrial complexes   Assessment/Plan Anemia Melena Pulmonary edema UTI Diabetes mellitus History of hypothyroidism COPD  Anemia Patient's hemoglobin has dropped to 6.1, the last hemoglobin as of 12/10/2013 was 8.7. Likely due to GI bleed, as patient has guaiac-positive stools. One unit of PRBC  transfusion has been ordered by the ED physician. We'll follow CBC in a.m.  Melena Patient has guaiac positive stool. Has history of external hemorrhoids per Epic, will get GI consultation for possible EGD  UTI Patient has abnormal urine with WBC 21-50, positive nitrite. Patient has allergy to penicillin, has been started on Cipro. We'll continue with Cipro per pharmacy consultation. Follow the urine culture results  Pulmonary edema Patient has history of diastolic CHF,  chest x-ray shows moderate pulmonary edema. Patient takes Lasix 40 mg by mouth at home, will hold the by mouth Lasix and we'll start Lasix 20 mg IV every 12 hours. Follow BMP in a.m. recent 2-D echo showed EF in the range of 60-65%.  Diabetes mellitus Will initiate sliding scale insulin with NovoLog  History of hypothyroidism Continue Synthroid  COPD Patient is on home oxygen, we'll continue with oxygen and when necessary DuoNeb nebulizers. At this time patient shortness of breath appears more likely due to CHF and COPD  DVT prophylaxis SCDs  Code status: The patient is no code  Family discussion: No family at bedside, discussed with patient in detail.   Time Spent on Admission: 60 minutes  Kyah Buesing S [facility] Pager: [phone] 01/06/2014, 4:33 PM  If 7PM-7AM, please contact night-coverage  [email or web address]  Password [email or web address]

## 2014-01-06 NOTE — ED Notes (Signed)
Patient with poor hygiene. Pericare completed. In and out cath completed.

## 2014-01-06 NOTE — ED Notes (Signed)
CRITICAL VALUE ALERT  Critical value received:  PCO2  Date of notification:  01/06/14  Time of notification:  1632  Critical value read back:Yes.    Nurse who received alert:  Earl Galasborne, RN  MD notified (1st page):  [place]  Time of first page:  [phone]

## 2014-01-06 NOTE — ED Notes (Signed)
Has been Altered mental status for last 2 days.  On o2 at home.

## 2014-01-06 NOTE — ED Provider Notes (Signed)
CSN: [id number]     Arrival date & time 01/06/14  1208 History   First MD Initiated Contact with Patient 01/06/14 1315     Chief Complaint  Patient presents with  . Altered Mental Status      HPI Pt was seen at 1400. Per pt and her caregivers, c/o gradual onset and persistence of constant cough and peripheral edema for the past 1 to 2 weeks. Pt's caregivers state they "increased her oxygen to 4L" because "she was coughing and breathing fast." Pt's caregivers also state pt has had poor PO intake and "been tired" for the past 1 week. Pt denies CP/palpitations, no abd pain, no N/V/D, no fevers, no falls.     Past Medical History  Diagnosis Date  . COPD (chronic obstructive pulmonary disease)   . Diabetes mellitus without complication   . Hypertension   . Thyroid disease   . Hyperlipemia   . Neuropathy   . Depression   . Chronic pain   . Anxiety   . CHF (congestive heart failure)   . Hemorrhoids   . Chronic cough   . Peripheral edema   . On home O2     2L N/C   Past Surgical History  Procedure Laterality Date  . Cholecystectomy      History  Substance Use Topics  . Smoking status: Former Games developer  . Smokeless tobacco: Not on file  . Alcohol Use: No    Review of Systems ROS: Statement: All systems negative except as marked or noted in the HPI; Constitutional: Negative for fever and chills. +generalized weakness/fatigue.; ; Eyes: Negative for eye pain, redness and discharge. ; ; ENMT: Negative for ear pain, hoarseness, nasal congestion, sinus pressure and sore throat. ; ; Cardiovascular: Negative for chest pain, palpitations, diaphoresis, +dyspnea and peripheral edema. ; ; Respiratory: +cough. Negative for wheezing and stridor. ; ; Gastrointestinal: +poor PO intake. Negative for nausea, vomiting, diarrhea, abdominal pain, blood in stool, hematemesis, jaundice and rectal bleeding. ; ; Genitourinary: Negative for dysuria, flank pain and hematuria. ; ; Musculoskeletal: Negative for  back pain and neck pain. Negative for swelling and trauma.; ; Skin: Negative for pruritus, rash, abrasions, blisters, bruising and skin lesion.; ; Neuro: +AMS. Negative for headache, lightheadedness and neck stiffness. Negative for altered level of consciousness, extremity weakness, paresthesias, involuntary movement, seizure and syncope.      Allergies  Codeine; Keflex; and Penicillins  Home Medications   Prior to Admission medications   Medication Sig Start Date End Date Taking? Authorizing Provider  albuterol (PROVENTIL) (2.5 MG/3ML) 0.083% nebulizer solution Take 3 mLs (2.5 mg total) by nebulization every 4 (four) hours as needed for wheezing or shortness of breath. 12/14/13  Yes Fredirick Maudlin, MD  aspirin EC 81 MG EC tablet Take 1 tablet (81 mg total) by mouth daily. 12/14/13  Yes Fredirick Maudlin, MD  clonazePAM (KLONOPIN) 1 MG tablet Take 0.5 tablets (0.5 mg total) by mouth 4 (four) times daily as needed for anxiety. 12/14/13  Yes Fredirick Maudlin, MD  DULoxetine (CYMBALTA) 20 MG capsule Take 1 capsule (20 mg total) by mouth daily. 01/05/13  Yes Gwenyth Bender, NP  folic acid (FOLVITE) 1 MG tablet Take 1 tablet (1 mg total) by mouth daily. 12/14/13  Yes Fredirick Maudlin, MD  furosemide (LASIX) 40 MG tablet Take 1 tablet (40 mg total) by mouth daily. 01/05/13  Yes Gwenyth Bender, NP  HYDROcodone-acetaminophen (NORCO/VICODIN) 5-325 MG per tablet Take 1 tablet by mouth every  4 (four) hours as needed for moderate pain.   Yes Historical Provider, MD  insulin lispro (HUMALOG) 100 UNIT/ML injection Inject 2-10 Units into the skin 3 (three) times daily before meals. SS   Yes Historical Provider, MD  levothyroxine (SYNTHROID, LEVOTHROID) 200 MCG tablet Take 200 mcg by mouth daily before breakfast.   Yes Historical Provider, MD  lisinopril (PRINIVIL,ZESTRIL) 5 MG tablet Take 1 tablet (5 mg total) by mouth daily. 01/05/13  Yes Lesle Chris Black, NP  PARoxetine (PAXIL) 20 MG tablet Take 20 mg by mouth every  morning.   Yes Historical Provider, MD  potassium chloride SA (K-DUR,KLOR-CON) 20 MEQ tablet Take 40 mEq by mouth daily.   Yes Historical Provider, MD  promethazine (PHENERGAN) 25 MG tablet Take 25 mg by mouth every 6 (six) hours as needed for nausea or vomiting.   Yes Historical Provider, MD  simvastatin (ZOCOR) 40 MG tablet Take 40 mg by mouth every morning.    Yes Historical Provider, MD  temazepam (RESTORIL) 15 MG capsule Take 15 mg by mouth at bedtime as needed for sleep.   Yes Historical Provider, MD   BP 122/69  Pulse 105  Temp(Src) 97.5 F (36.4 C) (Rectal)  Resp 23  SpO2 94% Physical Exam 1405: Physical examination:  Nursing notes reviewed; Vital signs and O2 SAT reviewed;  Constitutional: Well developed, Well nourished, In no acute distress; Head:  Normocephalic, atraumatic; Eyes: EOMI, PERRL, No scleral icterus. Conjunctiva pale.; ENMT: Mouth and pharynx normal, Mucous membranes dry and cracked.; Neck: Supple, Full range of motion, No lymphadenopathy; Cardiovascular: Tachycardic rate and rhythm, No gallop; Respiratory: Breath sounds diminished & equal bilaterally, faint scattered wheezes. No audible wheezing. Speaking full sentences with ease, Normal respiratory effort/excursion; Chest: Nontender, Movement normal; Abdomen: Obese, Soft, Nontender, Nondistended, Normal bowel sounds; Genitourinary: No CVA tenderness. +incont of foul smelling urine.; Extremities: Pulses normal, No tenderness, +2 pedal edema bilat without calf asymmetry.; Neuro: AA&Ox3, Major CN grossly intact.  Speech clear. Moves all extremities on stretcher and to command without apparent gross focal motor deficits.; Skin: Color pale, Warm, Dry.   ED Course  Procedures    EKG Interpretation   Date/Time:  Saturday January 06 2014 12:24:15 EDT Ventricular Rate:  104 PR Interval:  140 QRS Duration: 82 QT Interval:  380 QTC Calculation: 499 R Axis:   40 Text Interpretation:  Sinus tachycardia Baseline wander Low  voltage QRS  Borderline ECG When compared with ECG of 12/06/2013 No significant change  was found Confirmed by [facility]  MD, Nicholos Johns [phone]) on 01/06/2014 2:18:30  PM      MDM  MDM Reviewed: previous chart, nursing note and vitals Reviewed previous: labs and ECG Interpretation: labs, ECG and x-ray Total time providing critical care: 30-74 minutes. This excludes time spent performing separately reportable procedures and services. Consults: admitting MD   CRITICAL CARE Performed by: Laray Anger Total critical care time: 35 Critical care time was exclusive of separately billable procedures and treating other patients. Critical care was necessary to treat or prevent imminent or life-threatening deterioration. Critical care was time spent personally by me on the following activities: development of treatment plan with patient and/or surrogate as well as nursing, discussions with consultants, evaluation of patient's response to treatment, examination of patient, obtaining history from patient or surrogate, ordering and performing treatments and interventions, ordering and review of laboratory studies, ordering and review of radiographic studies, pulse oximetry and re-evaluation of patient's condition.   Results for orders placed during the hospital encounter of  01/06/14  CBC WITH DIFFERENTIAL      Result Value Ref Range   WBC 9.8  4.0 - 10.5 K/uL   RBC 2.01 (*) 3.87 - 5.11 MIL/uL   Hemoglobin 6.1 (*) 12.0 - 15.0 g/dL   HCT 16.120.9 (*) 09.636.0 - 04.546.0 %   MCV 104.0 (*) 78.0 - 100.0 fL   MCH 30.3  26.0 - 34.0 pg   MCHC 29.2 (*) 30.0 - 36.0 g/dL   RDW 40.921.5 (*) 81.111.5 - 91.415.5 %   Platelets 178  150 - 400 K/uL   Neutrophils Relative % 79 (*) 43 - 77 %   Lymphocytes Relative 9 (*) 12 - 46 %   Monocytes Relative 10  3 - 12 %   Eosinophils Relative 2  0 - 5 %   Basophils Relative 0  0 - 1 %   Neutro Abs 7.7  1.7 - 7.7 K/uL   Lymphs Abs 0.9  0.7 - 4.0 K/uL   Monocytes Absolute 1.0  0.1 - 1.0  K/uL   Eosinophils Absolute 0.2  0.0 - 0.7 K/uL   Basophils Absolute 0.0  0.0 - 0.1 K/uL   WBC Morphology ATYPICAL LYMPHOCYTES    BASIC METABOLIC PANEL      Result Value Ref Range   Sodium 141  137 - 147 mEq/L   Potassium 4.2  3.7 - 5.3 mEq/L   Chloride 97  96 - 112 mEq/L   CO2 37 (*) 19 - 32 mEq/L   Glucose, Bld 122 (*) 70 - 99 mg/dL   BUN 19  6 - 23 mg/dL   Creatinine, Ser 7.821.08  0.50 - 1.10 mg/dL   Calcium 8.6  8.4 - 95.610.5 mg/dL   GFR calc non Af Amer 56 (*) >90 mL/min   GFR calc Af Amer 65 (*) >90 mL/min   Anion gap 7  5 - 15  TROPONIN I      Result Value Ref Range   Troponin I <0.30  <0.30 ng/mL  PRO B NATRIURETIC PEPTIDE      Result Value Ref Range   Pro B Natriuretic peptide (BNP) 1142.0 (*) 0 - 125 pg/mL  URINALYSIS, ROUTINE W REFLEX MICROSCOPIC      Result Value Ref Range   Color, Urine AMBER (*) YELLOW   APPearance HAZY (*) CLEAR   Specific Gravity, Urine 1.020  1.005 - 1.030   pH 5.5  5.0 - 8.0   Glucose, UA NEGATIVE  NEGATIVE mg/dL   Hgb urine dipstick MODERATE (*) NEGATIVE   Bilirubin Urine NEGATIVE  NEGATIVE   Ketones, ur NEGATIVE  NEGATIVE mg/dL   Protein, ur NEGATIVE  NEGATIVE mg/dL   Urobilinogen, UA 1.0  0.0 - 1.0 mg/dL   Nitrite POSITIVE (*) NEGATIVE   Leukocytes, UA SMALL (*) NEGATIVE  URINE MICROSCOPIC-ADD ON      Result Value Ref Range   WBC, UA 21-50  <3 WBC/hpf   RBC / HPF 3-6  <3 RBC/hpf   Bacteria, UA MANY (*) RARE  BLOOD GAS, ARTERIAL      Result Value Ref Range   O2 Content 3.5     Delivery systems NASAL CANNULA     pH, Arterial 7.348 (*) 7.350 - 7.450   pCO2 arterial 69.5 (*) 35.0 - 45.0 mmHg   pO2, Arterial 91.4  80.0 - 100.0 mmHg   Bicarbonate 37.2 (*) 20.0 - 24.0 mEq/L   TCO2 36.7  0 - 100 mmol/L   Acid-Base Excess 11.3 (*) 0.0 - 2.0 mmol/L  O2 Saturation 97.3     Patient temperature 37.0     Collection site LEFT RADIAL     Drawn by [phone]     Sample type ARTERIAL     Allens test (pass/fail) PASS  PASS  CBG MONITORING, ED       Result Value Ref Range   Glucose-Capillary 125 (*) 70 - 99 mg/dL  POC OCCULT BLOOD, ED      Result Value Ref Range   Fecal Occult Bld POSITIVE (*) NEGATIVE  TYPE AND SCREEN      Result Value Ref Range   ABO/RH(D) O NEG     Antibody Screen PENDING     Sample Expiration 01/09/2014    PREPARE RBC (CROSSMATCH)      Result Value Ref Range   Order Confirmation ORDER PROCESSED BY BLOOD BANK     Dg Chest Portable 1 View 01/06/2014   CLINICAL DATA:  58 year old female with altered mental status and lethargy.  EXAM: PORTABLE CHEST - 1 VIEW  COMPARISON:  12/06/2013 and prior chest radiographs dating back to 01/03/2013  FINDINGS: Cardiomegaly and moderate pulmonary edema identified.  Bilateral pleural effusions noted, small to moderate on the right and small on the left.  There is no evidence of pneumothorax or acute bony abnormality.  IMPRESSION: Cardiomegaly with moderate pulmonary edema and bilateral pleural effusions.   Electronically Signed   By: Laveda Abbe M.D.   On: 01/06/2014 13:59    1600:  Stool is heme positive. Hgb lower than baseline; will transfuse PRBC's. +UTI, UC pending; will dose IV cipro. BNP elevated with pulmonary edema on CXR; will dose IV lasix. Short neb given for wheezing with improvement. T/C to [facility]. Sharl Ma, case discussed, including:  HPI, pertinent PM/SHx, VS/PE, dx testing, ED course and treatment:  Agreeable to admit, requests to write temporary orders, obtain tele bed to Dr. Felecia Shelling.  1630:  CO2 elevated on ABG; will start Bipap. Inpatient team updated.     Laray Anger, DO 01/08/14 2020

## 2014-01-06 NOTE — ED Notes (Signed)
Caregiver states patient has had cold like symptoms x several days with anorexia. Patient normally wears 2 L/[place] but has increased it due to "not being able to breathe". Patient lethargic, but arouses to voice.

## 2014-01-06 NOTE — ED Notes (Signed)
CRITICAL VALUE ALERT  Critical value received:  HGB  Date of notification:  01/06/14  Time of notification:  1420  Critical value read back:Yes.    Nurse who received alert:  Kourtnee Lahey,RN  MD notified (1st page):  [place]  Time of first page:  [phone]

## 2014-01-07 DIAGNOSIS — D538 Other specified nutritional anemias: Secondary | ICD-10-CM

## 2014-01-07 LAB — CBC
HCT: 24.5 % — ABNORMAL LOW (ref 36.0–46.0)
HEMATOCRIT: 25.2 % — AB (ref 36.0–46.0)
HEMOGLOBIN: 7.8 g/dL — AB (ref 12.0–15.0)
HEMOGLOBIN: 7.6 g/dL — AB (ref 12.0–15.0)
MCH: 31 pg (ref 26.0–34.0)
MCH: 31.3 pg (ref 26.0–34.0)
MCHC: 31 g/dL (ref 30.0–36.0)
MCHC: 31 g/dL (ref 30.0–36.0)
MCV: 100 fL (ref 78.0–100.0)
MCV: 100.8 fL — AB (ref 78.0–100.0)
PLATELETS: 159 10*3/uL (ref 150–400)
Platelets: 168 10*3/uL (ref 150–400)
RBC: 2.52 MIL/uL — ABNORMAL LOW (ref 3.87–5.11)
RBC: 2.43 MIL/uL — AB (ref 3.87–5.11)
RDW: 22.6 % — ABNORMAL HIGH (ref 11.5–15.5)
RDW: 22.6 % — ABNORMAL HIGH (ref 11.5–15.5)
WBC: 8.7 10*3/uL (ref 4.0–10.5)
WBC: 8.5 10*3/uL (ref 4.0–10.5)

## 2014-01-07 LAB — GLUCOSE, CAPILLARY
GLUCOSE-CAPILLARY: 107 mg/dL — AB (ref 70–99)
Glucose-Capillary: 128 mg/dL — ABNORMAL HIGH (ref 70–99)
Glucose-Capillary: 112 mg/dL — ABNORMAL HIGH (ref 70–99)
Glucose-Capillary: 132 mg/dL — ABNORMAL HIGH (ref 70–99)

## 2014-01-07 LAB — COMPREHENSIVE METABOLIC PANEL
ALT: 11 U/L (ref 0–35)
AST: 32 U/L (ref 0–37)
Albumin: 2.4 g/dL — ABNORMAL LOW (ref 3.5–5.2)
Alkaline Phosphatase: 138 U/L — ABNORMAL HIGH (ref 39–117)
Anion gap: 8 (ref 5–15)
BUN: 20 mg/dL (ref 6–23)
CALCIUM: 8.4 mg/dL (ref 8.4–10.5)
CO2: 36 mEq/L — ABNORMAL HIGH (ref 19–32)
CREATININE: 1.05 mg/dL (ref 0.50–1.10)
Chloride: 97 mEq/L (ref 96–112)
GFR calc Af Amer: 67 mL/min — ABNORMAL LOW (ref >90)
GFR calc non Af Amer: 58 mL/min — ABNORMAL LOW (ref >90)
GLUCOSE: 108 mg/dL — AB (ref 70–99)
Potassium: 4.3 mEq/L (ref 3.7–5.3)
Sodium: 141 mEq/L (ref 137–147)
TOTAL PROTEIN: 6.1 g/dL (ref 6.0–8.3)
Total Bilirubin: 4.1 mg/dL — ABNORMAL HIGH (ref 0.3–1.2)

## 2014-01-07 LAB — BILIRUBIN, DIRECT: Bilirubin, Direct: 1.4 mg/dL — ABNORMAL HIGH (ref 0.0–0.3)

## 2014-01-07 LAB — VITAMIN B12: VITAMIN B 12: 943 pg/mL — AB (ref 211–911)

## 2014-01-07 MED ORDER — PANTOPRAZOLE SODIUM 40 MG PO TBEC
40.0000 mg | DELAYED_RELEASE_TABLET | Freq: Two times a day (BID) | ORAL | Status: DC
  Administered 2014-01-07 – 2014-01-22 (×29): 40 mg via ORAL
  Filled 2014-01-07 (×30): qty 1

## 2014-01-07 MED ORDER — NYSTATIN 100000 UNIT/GM EX POWD
CUTANEOUS | Status: AC
  Filled 2014-01-07: qty 15

## 2014-01-07 MED ORDER — BIOTENE DRY MOUTH MT LIQD
15.0000 mL | Freq: Two times a day (BID) | OROMUCOSAL | Status: DC
  Administered 2014-01-07 – 2014-01-21 (×20): 15 mL via OROMUCOSAL

## 2014-01-07 NOTE — Progress Notes (Signed)
CBC came back with hemoglobin of 7.6. Dr. Ouida SillsFagan made aware, no new orders at this time. Will continue to monitor.

## 2014-01-07 NOTE — Progress Notes (Signed)
Pt has not been seen by attending yet today. Dr. Felecia ShellingFanta is listed as pt's attending, but Dr. Ouida SillsFagan is covering. I paged Dr. Ouida SillsFagan and he explained to me that Dr. Felecia ShellingFanta would be rounding on his pt's today. Dr. Felecia ShellingFanta has still not been by. I have paged Dr. Felecia ShellingFanta to remind him that his pt is in ICU, but I have not received a call back. Dr. Ouida SillsFagan has been recalled and the situation has been explained. Dr. Ouida SillsFagan is aware. Will continue to monitor.

## 2014-01-07 NOTE — Consult Note (Signed)
Referring Provider: No ref. provider found Primary Care Physician:  Avon Gully, MD Primary Gastroenterologist:  Jonette Eva  Reason for Consultation:  *ANEMIA  Impression: ADMITTED WITH CHF EXACERBATION/CHRONIC ANEMIA DUE TO ?B12 DEFICIENCY/CHRONIC DISEASE/OCCULT GI BLOOD LOSS. CURRENTLY NOT A CANDIDATE FOR EGD UNTIL CARDIOPULMONARY STATUS IMPROVES. PT AGREES TO EGD BUT WOULD PREFER TO WAIT FOR TCS. ANEMIA MOST LIKELY DUE TO CHRONIC DISEASE, BUT COULD HAVE H PYLORI GASTRITIS, LESS LIKELY EROSIVE GASTRITIS, STRESS ULCER, OR PUD. NO SIGNS OR SYMPTOMS OF ACTIVE GI BLEED.   Plan: 1. CONTINUE TO MONITOR CHF SYMPTOMS FOR IMPROVEMENT 2. EGD PRIOR TO DISCHARGE WITH CPAP. 3. DISCUSSED PROCEDURE(EGD/TCS), BENEFITS, AND RISKS. 4. BID PPI 5. MAY CONTINUE ASA FOR NOW.   HPI:  PT CAME TO ED WITH WORSENING SOB. CXR SHOWS CHF. ALSO NOTED TO HAVE A DROP IN HER Hb FROM 8.9 TO 6.1. SUBJECTIVE CHILLS. OCCASIONAL NAUSEA/VOMITING. UNINTENTIONAL WEIGHT LOSS 516 LBS TO 358 BS SINCE 2014.  PT DENIES FEVER, CHILLS, HEMATOCHEZIA, melena, diarrhea, CHEST PAIN, constipation, abdominal pain, problems swallowing, OR heartburn or indigestion.  Past Medical History  Diagnosis Date  . COPD (chronic obstructive pulmonary disease)   . Diabetes mellitus without complication   . Hypertension   . Thyroid disease   . Hyperlipemia   . Neuropathy   . Depression   . Chronic pain   . Anxiety   . CHF (congestive heart failure)   . Hemorrhoids   . Chronic cough   . Peripheral edema   . On home O2     2L N/C    Past Surgical History  Procedure Laterality Date  . Cholecystectomy      Prior to Admission medications   Medication Sig Start Date End Date Taking? Authorizing Provider  albuterol (PROVENTIL) (2.5 MG/3ML) 0.083% nebulizer solution Take 3 mLs (2.5 mg total) by nebulization every 4 (four) hours as needed for wheezing or shortness of breath. 12/14/13  Yes Fredirick Maudlin, MD  aspirin EC 81 MG EC tablet  Take 1 tablet (81 mg total) by mouth daily. 12/14/13  Yes Fredirick Maudlin, MD  clonazePAM (KLONOPIN) 1 MG tablet Take 0.5 tablets (0.5 mg total) by mouth 4 (four) times daily as needed for anxiety. 12/14/13  Yes Fredirick Maudlin, MD  DULoxetine (CYMBALTA) 20 MG capsule Take 1 capsule (20 mg total) by mouth daily. 01/05/13  Yes Gwenyth Bender, NP  folic acid (FOLVITE) 1 MG tablet Take 1 tablet (1 mg total) by mouth daily. 12/14/13  Yes Fredirick Maudlin, MD  furosemide (LASIX) 40 MG tablet Take 1 tablet (40 mg total) by mouth daily. 01/05/13  Yes Gwenyth Bender, NP  HYDROcodone-acetaminophen (NORCO/VICODIN) 5-325 MG per tablet Take 1 tablet by mouth every 4 (four) hours as needed for moderate pain.   Yes Historical Provider, MD  insulin lispro (HUMALOG) 100 UNIT/ML injection Inject 2-10 Units into the skin 3 (three) times daily before meals. SS   Yes Historical Provider, MD  levothyroxine (SYNTHROID, LEVOTHROID) 200 MCG tablet Take 200 mcg by mouth daily before breakfast.   Yes Historical Provider, MD  lisinopril (PRINIVIL,ZESTRIL) 5 MG tablet Take 1 tablet (5 mg total) by mouth daily. 01/05/13  Yes Lesle Chris Black, NP  PARoxetine (PAXIL) 20 MG tablet Take 20 mg by mouth every morning.   Yes Historical Provider, MD  potassium chloride SA (K-DUR,KLOR-CON) 20 MEQ tablet Take 40 mEq by mouth daily.   Yes Historical Provider, MD  promethazine (PHENERGAN) 25 MG tablet Take 25 mg by mouth every  6 (six) hours as needed for nausea or vomiting.   Yes Historical Provider, MD  simvastatin (ZOCOR) 40 MG tablet Take 40 mg by mouth every morning.    Yes Historical Provider, MD  temazepam (RESTORIL) 15 MG capsule Take 15 mg by mouth at bedtime as needed for sleep.   Yes Historical Provider, MD    Current Facility-Administered Medications  Medication Dose Route Frequency Provider Last Rate Last Dose  . 0.9 %  sodium chloride infusion   Intravenous Continuous Meredeth Ide, MD 10 mL/hr at 01/07/14 0400    . albuterol (PROVENTIL)  (2.5 MG/3ML) 0.083% nebulizer solution 2.5 mg  2.5 mg Nebulization Q4H PRN Meredeth Ide, MD      . antiseptic oral rinse (BIOTENE) solution 15 mL  15 mL Mouth Rinse q12n4p Meredeth Ide, MD      . aspirin EC tablet 81 mg  81 mg Oral Daily Meredeth Ide, MD   81 mg at 01/07/14 0859  . chlorhexidine (PERIDEX) 0.12 % solution 15 mL  15 mL Mouth Rinse BID Meredeth Ide, MD   15 mL at 01/07/14 0807  . Chlorhexidine Gluconate Cloth 2 % PADS 6 each  6 each Topical Q0600 Meredeth Ide, MD   6 each at 01/07/14 [phone]  . ciprofloxacin (CIPRO) IVPB 400 mg  400 mg Intravenous Q12H Meredeth Ide, MD   400 mg at 01/07/14 9604  . clonazePAM (KLONOPIN) tablet 0.5 mg  0.5 mg Oral TID PRN Meredeth Ide, MD      . DULoxetine (CYMBALTA) DR capsule 20 mg  20 mg Oral Daily Meredeth Ide, MD      . folic acid (FOLVITE) tablet 1 mg  1 mg Oral Daily Meredeth Ide, MD   1 mg at 01/07/14 0900  . furosemide (LASIX) injection 20 mg  20 mg Intravenous Q12H Meredeth Ide, MD   20 mg at 01/07/14 5409  . HYDROcodone-acetaminophen (NORCO/VICODIN) 5-325 MG per tablet 1 tablet  1 tablet Oral Q4H PRN Meredeth Ide, MD      . insulin aspart (novoLOG) injection 0-20 Units  0-20 Units Subcutaneous TID WC Meredeth Ide, MD   3 Units at 01/07/14 [phone]  . levothyroxine (SYNTHROID, LEVOTHROID) tablet 200 mcg  200 mcg Oral QAC breakfast Meredeth Ide, MD   200 mcg at 01/07/14 0806  . mupirocin ointment (BACTROBAN) 2 % 1 application  1 application Nasal BID Meredeth Ide, MD   1 application at 01/07/14 1000  . nystatin (MYCOSTATIN/NYSTOP) topical powder   Topical BID Jinger Neighbors, NP      . ondansetron (ZOFRAN) tablet 4 mg  4 mg Oral Q6H PRN Meredeth Ide, MD       Or  . ondansetron (ZOFRAN) injection 4 mg  4 mg Intravenous Q6H PRN Meredeth Ide, MD      . PARoxetine (PAXIL) tablet 20 mg  20 mg Oral Daily Meredeth Ide, MD   20 mg at 01/07/14 0900  . potassium chloride SA (K-DUR,KLOR-CON) CR tablet 40 mEq  40 mEq Oral Daily Meredeth Ide, MD   40 mEq at 01/07/14  0859  . simvastatin (ZOCOR) tablet 40 mg  40 mg Oral q morning - 10a Meredeth Ide, MD   40 mg at 01/06/14 2019  . temazepam (RESTORIL) capsule 15 mg  15 mg Oral QHS PRN Meredeth Ide, MD        Allergies as of 01/06/2014 -  Review Complete 01/06/2014  Allergen Reaction Noted  . Codeine Nausea And Vomiting 01/03/2013  . Keflex [cephalexin] Hives and Nausea And Vomiting 01/03/2013  . Penicillins Nausea And Vomiting 01/03/2013    FAMILY HISTORY: NO COLON CA OR POLYPS   History   Social History  . Marital Status: Divorced    Spouse Name: N/A    Number of Children: N/A  . Years of Education: N/A   Occupational History  . Not on file.   Social History Main Topics  . Smoking status: Former Games developermoker  . Smokeless tobacco: Not on file  . Alcohol Use: No  . Drug Use: No  . Sexual Activity: Not on file   Review of Systems: PER HPI OTHERWISE ALL SYSTEMS ARE NEGATIVE.   Vitals: Blood pressure 108/63, pulse 100, temperature 96.4 F (35.8 C), temperature source Axillary, resp. rate 15, height 5\' 5"  (1.651 m), weight 358 lb 4 oz (162.5 kg), SpO2 95.00%.  Physical Exam: General:   Alert,  pleasant and cooperative in UNABLE TO COMPLETE FULL SENTENCES Head:  Normocephalic and atraumatic. Eyes:  Sclera clear, no icterus.   Conjunctiva pink. Mouth:  No lesions, dentition ABnormal. Neck:  Supple; no masses. Lungs:  Clear throughout to auscultation IN ANTERIOR LUNG FIELDS, DECREASED BREATH SOUNDS BILATERALLY IN BASES.   No wheezes. No acute distress. Heart:  Regular rate and rhythm; no murmurs. Abdomen:  Soft, MILD TTP IN THE EPIGASTRIUM  Non-distended, OBESE, No masses noted. Normal bowel sounds, without guarding, and without rebound.   Msk:  Symmetrical   Extremities:  With edema. Neurologic:  Alert and  oriented x4;  grossly normal neurologically. Cervical Nodes:  No significant cervical adenopathy. Psych:  Alert and cooperative. Normal mood and FLAT affect.   Lab Results:  Recent  Labs  01/06/14 1318 01/07/14 0200  WBC 9.8 8.7  HGB 6.1* 7.8*  HCT 20.9* 25.2*  PLT 178 168   BMET  Recent Labs  01/06/14 1318 01/07/14 0200  NA 141 141  K 4.2 4.3  CL 97 97  CO2 37* 36*  GLUCOSE 122* 108*  BUN 19 20  CREATININE 1.08 1.05  CALCIUM 8.6 8.4   LFT  Recent Labs  01/07/14 0200  PROT 6.1  ALBUMIN 2.4*  AST 32  ALT 11  ALKPHOS 138*  BILITOT 4.1*     Studies/Results: CXR: BIL PLEURAL EFFUSIONS/ PULMONARY EDEMA   LOS: 1 day   Sandi Fields  01/07/2014, 9:00 AM

## 2014-01-07 NOTE — Progress Notes (Signed)
ANTIBIOTIC CONSULT NOTE - INITIAL  Pharmacy Consult for Cipro Indication: UTI  Allergies  Allergen Reactions  . Codeine Nausea And Vomiting  . Keflex [Cephalexin] Hives and Nausea And Vomiting  . Penicillins Nausea And Vomiting   Patient Measurements: Height: 5\' 5"  (165.1 cm) Weight: 358 lb 4 oz (162.5 kg) IBW/kg (Calculated) : 57  Vital Signs: Temp: 98.1 F (36.7 C) (07/26 0400) Temp src: Oral (07/26 0400) BP: 100/57 mmHg (07/26 0600) Pulse Rate: 102 (07/26 0600) Intake/Output from previous day: 07/25 0701 - 07/26 0700 In: 1442.5 [P.O.:530; I.V.:30; Blood:682.5; IV Piggyback:200] Out: 1950 [Urine:1950] Intake/Output from this shift:    Labs:  Recent Labs  01/06/14 1318 01/07/14 0200  WBC 9.8 8.7  HGB 6.1* 7.8*  PLT 178 168  CREATININE 1.08 1.05   Estimated Creatinine Clearance: 93.7 ml/min (by C-G formula based on Cr of 1.05). No results found for this basename: VANCOTROUGH, Leodis BinetVANCOPEAK, VANCORANDOM, GENTTROUGH, GENTPEAK, GENTRANDOM, TOBRATROUGH, TOBRAPEAK, TOBRARND, AMIKACINPEAK, AMIKACINTROU, AMIKACIN,  in the last 72 hours   Microbiology: Recent Results (from the past 720 hour(s))  MRSA PCR SCREENING     Status: Abnormal   Collection Time    01/06/14  6:25 PM      Result Value Ref Range Status   MRSA by PCR POSITIVE (*) NEGATIVE Final   Comment:            The GeneXpert MRSA Assay (FDA     approved for NASAL specimens     only), is one component of a     comprehensive MRSA colonization     surveillance program. It is not     intended to diagnose MRSA     infection nor to guide or     monitor treatment for     MRSA infections.     CRITICAL RESULT CALLED TO, READ BACK BY AND VERIFIED WITH:     M.GRAY AT 2110 ON 01/06/14 BY S.VANHOORNE    Medical History: Past Medical History  Diagnosis Date  . COPD (chronic obstructive pulmonary disease)   . Diabetes mellitus without complication   . Hypertension   . Thyroid disease   . Hyperlipemia   .  Neuropathy   . Depression   . Chronic pain   . Anxiety   . CHF (congestive heart failure)   . Hemorrhoids   . Chronic cough   . Peripheral edema   . On home O2     2L N/C   Cipro 7/25 >>  Assessment: 58yo morbidly obese female admitted with COPD, LE swelling, and suspected UTI.  Asked to initiate Cipro. Estimated Creatinine Clearance: 93.7 ml/min (by C-G formula based on Cr of 1.05).  Goal of Therapy:  Eradicate infection.  Plan:  Cipro 400mg  IV q12hrs Switch to PO when improved / appropriate Monitor labs, progress, and cultures  Valrie HartHall, Tylee Newby A 01/07/2014,8:23 AM

## 2014-01-08 LAB — HEPATIC FUNCTION PANEL
ALBUMIN: 2.5 g/dL — AB (ref 3.5–5.2)
ALT: 11 U/L (ref 0–35)
AST: 28 U/L (ref 0–37)
Alkaline Phosphatase: 136 U/L — ABNORMAL HIGH (ref 39–117)
BILIRUBIN DIRECT: 1.5 mg/dL — AB (ref 0.0–0.3)
Indirect Bilirubin: 2.2 mg/dL — ABNORMAL HIGH (ref 0.3–0.9)
Total Bilirubin: 3.7 mg/dL — ABNORMAL HIGH (ref 0.3–1.2)
Total Protein: 6.3 g/dL (ref 6.0–8.3)

## 2014-01-08 LAB — URINE CULTURE: Colony Count: >=100000

## 2014-01-08 LAB — GLUCOSE, CAPILLARY
GLUCOSE-CAPILLARY: 129 mg/dL — AB (ref 70–99)
Glucose-Capillary: 136 mg/dL — ABNORMAL HIGH (ref 70–99)
Glucose-Capillary: 116 mg/dL — ABNORMAL HIGH (ref 70–99)
Glucose-Capillary: 126 mg/dL — ABNORMAL HIGH (ref 70–99)

## 2014-01-08 LAB — PREPARE RBC (CROSSMATCH)

## 2014-01-08 MED ORDER — CIPROFLOXACIN HCL 250 MG PO TABS
500.0000 mg | ORAL_TABLET | Freq: Two times a day (BID) | ORAL | Status: DC
  Administered 2014-01-08 – 2014-01-12 (×6): 500 mg via ORAL
  Filled 2014-01-08 (×6): qty 2

## 2014-01-08 MED ORDER — HYDROCORTISONE 2.5 % RE CREA
TOPICAL_CREAM | Freq: Two times a day (BID) | RECTAL | Status: DC
  Administered 2014-01-08: 1 via RECTAL
  Administered 2014-01-08 – 2014-01-11 (×6): via RECTAL
  Administered 2014-01-12: 1 via RECTAL
  Administered 2014-01-12 – 2014-01-13 (×2): via RECTAL
  Administered 2014-01-13: 1 via RECTAL
  Administered 2014-01-14 – 2014-01-15 (×4): via RECTAL
  Administered 2014-01-16: 1 via RECTAL
  Administered 2014-01-16 – 2014-01-17 (×2): via RECTAL
  Administered 2014-01-17: 1 via RECTAL
  Administered 2014-01-18 – 2014-01-19 (×3): via RECTAL
  Administered 2014-01-19: 1 via RECTAL
  Administered 2014-01-20 – 2014-01-22 (×5): via RECTAL
  Filled 2014-01-08: qty 28.35

## 2014-01-08 NOTE — Progress Notes (Signed)
Nutrition Brief Note  Patient identified on the Malnutrition Screening Tool (MST) Report  Wt Readings from Last 15 Encounters:  01/08/14 359 lb 5.6 oz (163 kg)  12/14/13 336 lb 13.8 oz (152.8 kg)  01/05/13 517 lb 3.2 oz (234.6 kg)   Pt admitted with CHF exacerbation. Pt is currently diuresing and with significant edema on admission. Weight fluctuations likely due to fluid. Nutrition hx reveals pt regularly consumes convenience foods and soda pop.   Body mass index is 59.8 kg/(m^2). Patient meets criteria for extreme obesity, class III based on current BMI.   Current diet order is Dysphagia 3, carb modified, patient is consuming approximately n/a% of meals at this time. Labs and medications reviewed.   No nutrition interventions warranted at this time. If nutrition issues arise, please consult RD.   Destiny Hagin A. Mayford KnifeWilliams, RD, LDN Pager: [phone]

## 2014-01-08 NOTE — Progress Notes (Addendum)
ANTIBIOTIC CONSULT NOTE - follow up  Pharmacy Consult for Cipro Indication: UTI  Allergies  Allergen Reactions  . Codeine Nausea And Vomiting  . Keflex [Cephalexin] Hives and Nausea And Vomiting  . Penicillins Nausea And Vomiting   Patient Measurements: Height: 5\' 5"  (165.1 cm) Weight: 359 lb 5.6 oz (163 kg) IBW/kg (Calculated) : 57  Vital Signs: Temp: 96.5 F (35.8 C) (07/27 1101) Temp src: Axillary (07/27 1101) BP: 122/61 mmHg (07/27 1000) Pulse Rate: 98 (07/27 1000) Intake/Output from previous day: 07/26 0701 - 07/27 0700 In: -  Out: 600 [Urine:600] Intake/Output from this shift: Total I/O In: 12.5 [Blood:12.5] Out: -   Labs:  Recent Labs  01/06/14 1318 01/07/14 0200 01/07/14 1740  WBC 9.8 8.7 8.5  HGB 6.1* 7.8* 7.6*  PLT 178 168 159  CREATININE 1.08 1.05  --    Estimated Creatinine Clearance: 93.9 ml/min (by C-G formula based on Cr of 1.05). No results found for this basename: Rolm Gala, VANCORANDOM, GENTTROUGH, GENTPEAK, GENTRANDOM, TOBRATROUGH, TOBRAPEAK, TOBRARND, AMIKACINPEAK, AMIKACINTROU, AMIKACIN,  in the last 72 hours   Microbiology: Recent Results (from the past 720 hour(s))  URINE CULTURE     Status: None   Collection Time    01/06/14  2:02 PM      Result Value Ref Range Status   Specimen Description URINE, CATHETERIZED   Final   Special Requests NONE   Final   Culture  Setup Time     Final   Value: 01/06/2014 21:21     Performed at [facility] Count     Final   Value: >=100,000 COLONIES/ML     Performed at [facility]   Culture     Final   Value: KLEBSIELLA PNEUMONIAE     Performed at [facility]   Report Status 01/08/2014 FINAL   Final   Organism ID, Bacteria KLEBSIELLA PNEUMONIAE   Final  MRSA PCR SCREENING     Status: Abnormal   Collection Time    01/06/14  6:25 PM      Result Value Ref Range Status   MRSA by PCR POSITIVE (*) NEGATIVE Final   Comment:            The GeneXpert MRSA  Assay (FDA     approved for NASAL specimens     only), is one component of a     comprehensive MRSA colonization     surveillance program. It is not     intended to diagnose MRSA     infection nor to guide or     monitor treatment for     MRSA infections.     CRITICAL RESULT CALLED TO, READ BACK BY AND VERIFIED WITH:     M.GRAY AT 2110 ON 01/06/14 BY S.VANHOORNE   Medical History: Past Medical History  Diagnosis Date  . COPD (chronic obstructive pulmonary disease)   . Diabetes mellitus without complication   . Hypertension   . Thyroid disease   . Hyperlipemia   . Neuropathy   . Depression   . Chronic pain   . Anxiety   . CHF (congestive heart failure)   . Hemorrhoids   . Chronic cough   . Peripheral edema   . On home O2     2L N/C   Cipro 7/25 >>  Assessment: 58yo morbidly obese female admitted with COPD, LE swelling, and suspected UTI.  Asked to initiate Cipro. Estimated Creatinine Clearance: 93.9 ml/min (by C-G formula based on  Cr of 1.05). Urine culture with > 100K colonies Klebsiella sens to Cipro  Colony Count -        Result:        >=100,000 COLONIES/ML [facility]      Culture -        Result:        KLEBSIELLA PNEUMONIAE [facility]formed at [facility]      Report Status 01/08/2014 FINAL        Organism ID, Bacteria KLEBSIELLA PNEUMONIAE      Culture & Susceptibility    KLEBSIELLA PNEUMONIAE    Antibiotic Sensitivity Microscan Status    AMPICILLIN Resistant >=32 RESISTANT Final    Method: MIC    CEFAZOLIN Sensitive <=4 SENSITIVE Final    Method: MIC    CEFTRIAXONE Sensitive <=1 SENSITIVE Final    Method: MIC    CIPROFLOXACIN Sensitive <=0.25 SENSITIVE Final    Method: MIC    GENTAMICIN Sensitive <=1 SENSITIVE Final    Method: MIC    LEVOFLOXACIN Sensitive <=0.12 SENSITIVE Final    Method: MIC    NITROFURANTOIN Sensitive <=16 SENSITIVE Final    Method: MIC    PIP/TAZO Sensitive <=4 SENSITIVE Final    Method: MIC     TOBRAMYCIN Sensitive <=1 SENSITIVE Final    Method: MIC    TRIMETH/SULFA Sensitive <=20 SENSITIVE Final    Method: MIC    Comments KLEBSIELLA PNEUMONIAE (MIC)    KLEBSIELLA PNEUMONIAE    PHARMACIST - PHYSICIAN COMMUNICATION CONCERNING: Antibiotic IV to Oral Route Change Policy  RECOMMENDATION: This patient is receiving Cipro by the intravenous route.  Based on criteria approved by the Pharmacy and Therapeutics Committee, the antibiotic(s) is/are being converted to the equivalent oral dose form(s).  DESCRIPTION: These criteria include:  Patient being treated for a respiratory tract infection, urinary tract infection, cellulitis or clostridium difficile associated diarrhea if on metronidazole  The patient is not neutropenic and does not exhibit a GI malabsorption state  The patient is eating (either orally or via tube) and/or has been taking other orally administered medications for a least 24 hours  The patient is improving clinically and has a Tmax < 100.5  If you have questions about this conversion, please contact the Pharmacy Department  [x]   [phone]( [phone] )  [facility] []   [phone]( [phone] )  [facility]  []   [phone]( [phone] )  [facility] []   [phone]( [phone] )  [facility]   Raequon Catanzaro, Lorin PicketScott A 01/08/2014,11:04 AM

## 2014-01-08 NOTE — Progress Notes (Addendum)
    Subjective: Denies abdominal pain, N/V. Nursing states small amount of bright red blood per rectum when turning overnight, but no significant lower GI bleeding or evidence of melena. Denies SOB.   Objective: Vital signs in last 24 hours: Temp:  [96.3 F (35.7 C)-97.7 F (36.5 C)] 97.7 F (36.5 C) (07/27 0400) Pulse Rate:  [93-103] 97 (07/27 0600) Resp:  [12-30] 30 (07/27 0600) BP: (86-123)/(25-88) 114/57 mmHg (07/27 0600) SpO2:  [93 %-100 %] 96 % (07/27 0600) Weight:  [359 lb 5.6 oz (163 kg)] 359 lb 5.6 oz (163 kg) (07/27 0500) Last BM Date: 01/06/14 General:   Lethargic, slow to answer but answers appropriately. Head:  Normocephalic and atraumatic. Eyes:  No icterus, sclera clear. Conjuctiva pink.  Abdomen:  Obese, anasarca, +BS, no TTP, difficult to appreciate HSM due to large body habitus. Extremities:  2+ lower extremity edema Neurologic:  Oriented X 4, flat affect   Intake/Output from previous day: 07/26 0701 - 07/27 0700 In: -  Out: 600 [Urine:600] Intake/Output this shift:    Lab Results:  Recent Labs  01/06/14 1318 01/07/14 0200 01/07/14 1740  WBC 9.8 8.7 8.5  HGB 6.1* 7.8* 7.6*  HCT 20.9* 25.2* 24.5*  PLT 178 168 159   BMET  Recent Labs  01/06/14 1318 01/07/14 0200  NA 141 141  K 4.2 4.3  CL 97 97  CO2 37* 36*  GLUCOSE 122* 108*  BUN 19 20  CREATININE 1.08 1.05  CALCIUM 8.6 8.4   LFT  Recent Labs  01/07/14 0200 01/07/14 0500  PROT 6.1  --   ALBUMIN 2.4*  --   AST 32  --   ALT 11  --   ALKPHOS 138*  --   BILITOT 4.1*  --   BILIDIR  --  1.4*     Studies/Results: Dg Chest Portable 1 View  01/06/2014   CLINICAL DATA:  [age] year old female with altered mental status and lethargy.  EXAM: PORTABLE CHEST - 1 VIEW  COMPARISON:  12/06/2013 and prior chest radiographs dating back to 01/03/2013  FINDINGS: Cardiomegaly and moderate pulmonary edema identified.  Bilateral pleural effusions noted, small to moderate on the right and small on the  left.  There is no evidence of pneumothorax or acute bony abnormality.  IMPRESSION: Cardiomegaly with moderate pulmonary edema and bilateral pleural effusions.   Electronically Signed   By: Laveda AbbeJeff  Hu M.D.   On: 01/06/2014 13:59    Assessment: [age] year old female admitted with acute on chronic CHF, anemia, heme positive stool without overt GI bleeding. Lethargic but able to answer questions slowly; will plan on EGD prior to discharge and once overall improved from cardiorespiratory standpoint. Colonoscopy may be pursued as outpatient. Hgb remains improved a little over a gram since admission, with 2 additional units ordered. Agree with transfusion.   Elevated LFTs: Tbili 4.1, AP 138 on admission. Unknown prior history of liver disease. Recheck now. Normal transaminases. Likely secondary to known heart failure.   Hematochezia: low-volume when turning. Likely benign anorectal source, hx of hemorrhoids. Add Anusol.   Plan: PPI BID Possible EGD 7/30 if continues to improve Add Anusol BID per rectum Recheck HFP now Colonoscopy as outpatient Agree with 2 units PRBCs Continue to follow H/H Will continue to follow with you   Nira RetortAnna W. Sams, ANP-BC [facility]     LOS: 2 days    01/08/2014, 8:10 AM

## 2014-01-08 NOTE — Progress Notes (Signed)
Subjective: Patient was admitted due to bilateral leg edema and anemia. She had + stool occult blood. She was transfused 2 units PRBC.  However her hgb is still 7.6. No chest paqin, cough or shortness of breath.  Objective: Vital signs in last 24 hours: Temp:  [96.3 F (35.7 C)-97.7 F (36.5 C)] 97.7 F (36.5 C) (07/27 0400) Pulse Rate:  [93-103] 97 (07/27 0600) Resp:  [12-30] 30 (07/27 0600) BP: (86-123)/(25-88) 114/57 mmHg (07/27 0600) SpO2:  [93 %-100 %] 96 % (07/27 0600) Weight:  [163 kg (359 lb 5.6 oz)] 163 kg (359 lb 5.6 oz) (07/27 0500) Weight change: 0.5 kg (1 lb 1.6 oz) Last BM Date: 01/06/14  Intake/Output from previous day: 07/26 0701 - 07/27 0700 In: -  Out: 600 [Urine:600]  PHYSICAL EXAM General appearance: alert and no distress Resp: diminished breath sounds bilaterally and rhonchi bilaterally Cardio: S1, S2 normal GI: soft, non-tender; bowel sounds normal; no masses,  no organomegaly Extremities: edema 2++  Lab Results:  Results for orders placed during the hospital encounter of 01/06/14 (from the past 48 hour(s))  CBG MONITORING, ED     Status: Abnormal   Collection Time    01/06/14 12:43 PM      Result Value Ref Range   Glucose-Capillary 125 (*) 70 - 99 mg/dL  VITAMIN B12     Status: Abnormal   Collection Time    01/06/14  1:17 PM      Result Value Ref Range   Vitamin B-12 943 (*) 211 - 911 pg/mL   Comment: Performed at [facility] WITH DIFFERENTIAL     Status: Abnormal   Collection Time    01/06/14  1:18 PM      Result Value Ref Range   WBC 9.8  4.0 - 10.5 K/uL   RBC 2.01 (*) 3.87 - 5.11 MIL/uL   Hemoglobin 6.1 (*) 12.0 - 15.0 g/dL   Comment: CRITICAL RESULT CALLED TO, READ BACK BY AND VERIFIED WITH:     OSBOURNE,T AT 1419 BY GODFREY,O ON 01/06/14     SPECIMEN CHECKED FOR CLOTS     REPEATED TO VERIFY   HCT 20.9 (*) 36.0 - 46.0 %   MCV 104.0 (*) 78.0 - 100.0 fL   MCH 30.3  26.0 - 34.0 pg   MCHC 29.2 (*) 30.0 - 36.0 g/dL   RDW  21.5 (*) 11.5 - 15.5 %   Platelets 178  150 - 400 K/uL   Neutrophils Relative % 79 (*) 43 - 77 %   Lymphocytes Relative 9 (*) 12 - 46 %   Monocytes Relative 10  3 - 12 %   Eosinophils Relative 2  0 - 5 %   Basophils Relative 0  0 - 1 %   Neutro Abs 7.7  1.7 - 7.7 K/uL   Lymphs Abs 0.9  0.7 - 4.0 K/uL   Monocytes Absolute 1.0  0.1 - 1.0 K/uL   Eosinophils Absolute 0.2  0.0 - 0.7 K/uL   Basophils Absolute 0.0  0.0 - 0.1 K/uL   WBC Morphology ATYPICAL LYMPHOCYTES     Comment: TOXIC GRANULATION  BASIC METABOLIC PANEL     Status: Abnormal   Collection Time    01/06/14  1:18 PM      Result Value Ref Range   Sodium 141  137 - 147 mEq/L   Potassium 4.2  3.7 - 5.3 mEq/L   Chloride 97  96 - 112 mEq/L   CO2 37 (*) 19 -  32 mEq/L   Glucose, Bld 122 (*) 70 - 99 mg/dL   BUN 19  6 - 23 mg/dL   Creatinine, Ser 1.08  0.50 - 1.10 mg/dL   Calcium 8.6  8.4 - 10.5 mg/dL   GFR calc non Af Amer 56 (*) >90 mL/min   GFR calc Af Amer 65 (*) >90 mL/min   Comment: (NOTE)     The eGFR has been calculated using the CKD EPI equation.     This calculation has not been validated in all clinical situations.     eGFR's persistently <90 mL/min signify possible Chronic Kidney     Disease.   Anion gap 7  5 - 15  TROPONIN I     Status: None   Collection Time    01/06/14  1:18 PM      Result Value Ref Range   Troponin I <0.30  <0.30 ng/mL   Comment:            Due to the release kinetics of cTnI,     a negative result within the first hours     of the onset of symptoms does not rule out     myocardial infarction with certainty.     If myocardial infarction is still suspected,     repeat the test at appropriate intervals.  PRO B NATRIURETIC PEPTIDE     Status: Abnormal   Collection Time    01/06/14  1:18 PM      Result Value Ref Range   Pro B Natriuretic peptide (BNP) 1142.0 (*) 0 - 125 pg/mL  URINE CULTURE     Status: None   Collection Time    01/06/14  2:02 PM      Result Value Ref Range   Specimen  Description URINE, CATHETERIZED     Special Requests NONE     Culture  Setup Time       Value: 01/06/2014 21:21     Performed at [facility]       Value: >=100,000 COLONIES/ML     Performed at [facility]   Culture       Value: [place]     Performed at [facility]   Report Status PENDING    URINALYSIS, ROUTINE W REFLEX MICROSCOPIC     Status: Abnormal   Collection Time    01/06/14  2:02 PM      Result Value Ref Range   Color, Urine AMBER (*) YELLOW   Comment: BIOCHEMICALS MAY BE AFFECTED BY COLOR   APPearance HAZY (*) CLEAR   Specific Gravity, Urine 1.020  1.005 - 1.030   pH 5.5  5.0 - 8.0   Glucose, UA NEGATIVE  NEGATIVE mg/dL   Hgb urine dipstick MODERATE (*) NEGATIVE   Bilirubin Urine NEGATIVE  NEGATIVE   Ketones, ur NEGATIVE  NEGATIVE mg/dL   Protein, ur NEGATIVE  NEGATIVE mg/dL   Urobilinogen, UA 1.0  0.0 - 1.0 mg/dL   Nitrite POSITIVE (*) NEGATIVE   Leukocytes, UA SMALL (*) NEGATIVE  URINE MICROSCOPIC-ADD ON     Status: Abnormal   Collection Time    01/06/14  2:02 PM      Result Value Ref Range   WBC, UA 21-50  <3 WBC/hpf   RBC / HPF 3-6  <3 RBC/hpf   Bacteria, UA MANY (*) RARE  POC OCCULT BLOOD, ED     Status: Abnormal   Collection Time    01/06/14  3:17 PM      Result Value Ref Range   Fecal Occult Bld POSITIVE (*) NEGATIVE  ABO/RH     Status: None   Collection Time    01/06/14  3:41 PM      Result Value Ref Range   ABO/RH(D) O NEG    TYPE AND SCREEN     Status: None   Collection Time    01/06/14  3:51 PM      Result Value Ref Range   ABO/RH(D) O NEG     Antibody Screen NEG     Sample Expiration 01/09/2014     Unit Number [id number]     Blood Component Type RED CELLS,LR     Unit division 00     Status of Unit ISSUED,FINAL     Transfusion Status OK TO TRANSFUSE     Crossmatch Result Compatible     Unit Number [id number]     Blood Component Type RED CELLS,LR     Unit division 00     Status  of Unit ISSUED,FINAL     Transfusion Status OK TO TRANSFUSE     Crossmatch Result Compatible    PREPARE RBC (CROSSMATCH)     Status: None   Collection Time    01/06/14  3:51 PM      Result Value Ref Range   Order Confirmation ORDER PROCESSED BY BLOOD BANK    BLOOD GAS, ARTERIAL     Status: Abnormal   Collection Time    01/06/14  4:18 PM      Result Value Ref Range   O2 Content 3.5     Delivery systems NASAL CANNULA     pH, Arterial 7.348 (*) 7.350 - 7.450   pCO2 arterial 69.5 (*) 35.0 - 45.0 mmHg   Comment: CRITICAL RESULT CALLED TO, READ BACK BY AND VERIFIED WITH:     TIFFANY OSBORNE,RN ON 01/06/2014 BY PEVIANY LAWSON,RRT AT 1630.   pO2, Arterial 91.4  80.0 - 100.0 mmHg   Bicarbonate 37.2 (*) 20.0 - 24.0 mEq/L   TCO2 36.7  0 - 100 mmol/L   Acid-Base Excess 11.3 (*) 0.0 - 2.0 mmol/L   O2 Saturation 97.3     Patient temperature 37.0     Collection site LEFT RADIAL     Drawn by [phone]     Sample type ARTERIAL     Allens test (pass/fail) PASS  PASS  MRSA PCR SCREENING     Status: Abnormal   Collection Time    01/06/14  6:25 PM      Result Value Ref Range   MRSA by PCR POSITIVE (*) NEGATIVE   Comment:            The GeneXpert MRSA Assay (FDA     approved for NASAL specimens     only), is one component of a     comprehensive MRSA colonization     surveillance program. It is not     intended to diagnose MRSA     infection nor to guide or     monitor treatment for     MRSA infections.     CRITICAL RESULT CALLED TO, READ BACK BY AND VERIFIED WITH:     M.GRAY AT 2110 ON 01/06/14 BY S.VANHOORNE  GLUCOSE, CAPILLARY     Status: Abnormal   Collection Time    01/06/14  9:20 PM      Result Value Ref Range   Glucose-Capillary 124 (*) 70 - 99 mg/dL   Comment  1 Notify RN    CBC     Status: Abnormal   Collection Time    01/07/14  2:00 AM      Result Value Ref Range   WBC 8.7  4.0 - 10.5 K/uL   RBC 2.52 (*) 3.87 - 5.11 MIL/uL   Hemoglobin 7.8 (*) 12.0 - 15.0 g/dL   Comment: POST  TRANSFUSION SPECIMEN   HCT 25.2 (*) 36.0 - 46.0 %   MCV 100.0  78.0 - 100.0 fL   MCH 31.0  26.0 - 34.0 pg   MCHC 31.0  30.0 - 36.0 g/dL   RDW 22.6 (*) 11.5 - 15.5 %   Platelets 168  150 - 400 K/uL  COMPREHENSIVE METABOLIC PANEL     Status: Abnormal   Collection Time    01/07/14  2:00 AM      Result Value Ref Range   Sodium 141  137 - 147 mEq/L   Potassium 4.3  3.7 - 5.3 mEq/L   Chloride 97  96 - 112 mEq/L   CO2 36 (*) 19 - 32 mEq/L   Glucose, Bld 108 (*) 70 - 99 mg/dL   BUN 20  6 - 23 mg/dL   Creatinine, Ser 1.05  0.50 - 1.10 mg/dL   Calcium 8.4  8.4 - 10.5 mg/dL   Total Protein 6.1  6.0 - 8.3 g/dL   Albumin 2.4 (*) 3.5 - 5.2 g/dL   AST 32  0 - 37 U/L   ALT 11  0 - 35 U/L   Alkaline Phosphatase 138 (*) 39 - 117 U/L   Total Bilirubin 4.1 (*) 0.3 - 1.2 mg/dL   GFR calc non Af Amer 58 (*) >90 mL/min   GFR calc Af Amer 67 (*) >90 mL/min   Comment: (NOTE)     The eGFR has been calculated using the CKD EPI equation.     This calculation has not been validated in all clinical situations.     eGFR's persistently <90 mL/min signify possible Chronic Kidney     Disease.   Anion gap 8  5 - 15  BILIRUBIN, DIRECT     Status: Abnormal   Collection Time    01/07/14  5:00 AM      Result Value Ref Range   Bilirubin, Direct 1.4 (*) 0.0 - 0.3 mg/dL  GLUCOSE, CAPILLARY     Status: Abnormal   Collection Time    01/07/14  7:47 AM      Result Value Ref Range   Glucose-Capillary 128 (*) 70 - 99 mg/dL  GLUCOSE, CAPILLARY     Status: Abnormal   Collection Time    01/07/14 11:27 AM      Result Value Ref Range   Glucose-Capillary 112 (*) 70 - 99 mg/dL  GLUCOSE, CAPILLARY     Status: Abnormal   Collection Time    01/07/14  4:36 PM      Result Value Ref Range   Glucose-Capillary 107 (*) 70 - 99 mg/dL  CBC     Status: Abnormal   Collection Time    01/07/14  5:40 PM      Result Value Ref Range   WBC 8.5  4.0 - 10.5 K/uL   RBC 2.43 (*) 3.87 - 5.11 MIL/uL   Hemoglobin 7.6 (*) 12.0 - 15.0 g/dL    HCT 24.5 (*) 36.0 - 46.0 %   MCV 100.8 (*) 78.0 - 100.0 fL   MCH 31.3  26.0 - 34.0 pg   MCHC 31.0  30.0 - 36.0 g/dL   RDW 22.6 (*) 11.5 - 15.5 %   Platelets 159  150 - 400 K/uL  GLUCOSE, CAPILLARY     Status: Abnormal   Collection Time    01/07/14  9:15 PM      Result Value Ref Range   Glucose-Capillary 132 (*) 70 - 99 mg/dL   Comment 1 Notify RN    GLUCOSE, CAPILLARY     Status: Abnormal   Collection Time    01/08/14  7:46 AM      Result Value Ref Range   Glucose-Capillary 129 (*) 70 - 99 mg/dL    ABGS  Recent Labs  01/06/14 1618  PHART 7.348*  PO2ART 91.4  TCO2 36.7  HCO3 37.2*   CULTURES Recent Results (from the past 240 hour(s))  URINE CULTURE     Status: None   Collection Time    01/06/14  2:02 PM      Result Value Ref Range Status   Specimen Description URINE, CATHETERIZED   Final   Special Requests NONE   Final   Culture  Setup Time     Final   Value: 01/06/2014 21:21     Performed at [facility]     Final   Value: >=100,000 COLONIES/ML     Performed at [facility]   Culture     Final   Value: [place]     Performed at [facility]   Report Status PENDING   Incomplete  MRSA PCR SCREENING     Status: Abnormal   Collection Time    01/06/14  6:25 PM      Result Value Ref Range Status   MRSA by PCR POSITIVE (*) NEGATIVE Final   Comment:            The GeneXpert MRSA Assay (FDA     approved for NASAL specimens     only), is one component of a     comprehensive MRSA colonization     surveillance program. It is not     intended to diagnose MRSA     infection nor to guide or     monitor treatment for     MRSA infections.     CRITICAL RESULT CALLED TO, READ BACK BY AND VERIFIED WITH:     M.GRAY AT 2110 ON 01/06/14 BY S.VANHOORNE   Studies/Results: Dg Chest Portable 1 View  01/06/2014   CLINICAL DATA:  58 year old female with altered mental status and lethargy.  EXAM: PORTABLE CHEST - 1 VIEW   COMPARISON:  12/06/2013 and prior chest radiographs dating back to 01/03/2013  FINDINGS: Cardiomegaly and moderate pulmonary edema identified.  Bilateral pleural effusions noted, small to moderate on the right and small on the left.  There is no evidence of pneumothorax or acute bony abnormality.  IMPRESSION: Cardiomegaly with moderate pulmonary edema and bilateral pleural effusions.   Electronically Signed   By: Hassan Rowan M.D.   On: 01/06/2014 13:59    Medications: I have reviewed the patient's current medications.  Assesment: Active Problems:   Anemia Pulmonary edema  UTI  Diabetes mellitus  History of hypothyroidism  COPD    Plan: Medications reviewed GI consult appreciated Will transfuse 2 units PRBC Will monitor CBC/BMP    LOS: 2 days   Pebble Botkin 01/08/2014, 8:02 AM

## 2014-01-08 NOTE — Clinical Social Work Note (Signed)
CSW referred for transportation needs. Met with pt at bedside with sister present. Pt states her sister has been staying with her since last hospitalization several weeks ago. She reports this has been going fairly well. Pt said she does not have transportation needs and has not told anyone that. No other needs reported by pt. CSW will sign off, but can be reconsulted if needed.  Benay Pike, [place]

## 2014-01-08 NOTE — Progress Notes (Signed)
PT Cancellation Note  Patient Details Name: Elayne Snareunice C Avitia MRN: [id number] DOB: 07-01-1955   Cancelled Treatment:    Reason Eval/Treat Not Completed: Medical issues which prohibited therapy Received orders and chart reviewed.  Spoke with RN and stated pt started on blood transfusion, and is going to receive 1 more pack of blood today (anticipating pt will not be done receiving blood until around dinner today).  Will reattempt PT evaluation as able.     Mohid Furuya 01/08/2014, 11:07 AM

## 2014-01-08 NOTE — Progress Notes (Signed)
REVIEWED.  

## 2014-01-09 ENCOUNTER — Inpatient Hospital Stay (HOSPITAL_COMMUNITY): Payer: Medicaid Other

## 2014-01-09 LAB — GLUCOSE, CAPILLARY
GLUCOSE-CAPILLARY: 115 mg/dL — AB (ref 70–99)
Glucose-Capillary: 104 mg/dL — ABNORMAL HIGH (ref 70–99)
Glucose-Capillary: 104 mg/dL — ABNORMAL HIGH (ref 70–99)

## 2014-01-09 LAB — CBC
HCT: 31.7 % — ABNORMAL LOW (ref 36.0–46.0)
Hemoglobin: 9.7 g/dL — ABNORMAL LOW (ref 12.0–15.0)
MCH: 30 pg (ref 26.0–34.0)
MCHC: 30.6 g/dL (ref 30.0–36.0)
MCV: 98.1 fL (ref 78.0–100.0)
Platelets: 160 10*3/uL (ref 150–400)
RBC: 3.23 MIL/uL — AB (ref 3.87–5.11)
RDW: 24 % — ABNORMAL HIGH (ref 11.5–15.5)
WBC: 8.9 10*3/uL (ref 4.0–10.5)

## 2014-01-09 LAB — BASIC METABOLIC PANEL
ANION GAP: 6 (ref 5–15)
BUN: 21 mg/dL (ref 6–23)
CALCIUM: 8.9 mg/dL (ref 8.4–10.5)
CHLORIDE: 98 meq/L (ref 96–112)
CO2: 39 meq/L — AB (ref 19–32)
Creatinine, Ser: 1.07 mg/dL (ref 0.50–1.10)
GFR calc Af Amer: 66 mL/min — ABNORMAL LOW (ref >90)
GFR calc non Af Amer: 57 mL/min — ABNORMAL LOW (ref >90)
Glucose, Bld: 113 mg/dL — ABNORMAL HIGH (ref 70–99)
Potassium: 4.3 mEq/L (ref 3.7–5.3)
SODIUM: 143 meq/L (ref 137–147)

## 2014-01-09 LAB — PREPARE RBC (CROSSMATCH)

## 2014-01-09 MED ORDER — BIOTENE DRY MOUTH MT LIQD
15.0000 mL | Freq: Two times a day (BID) | OROMUCOSAL | Status: DC
  Administered 2014-01-09 – 2014-01-21 (×23): 15 mL via OROMUCOSAL

## 2014-01-09 MED ORDER — BENZONATATE 100 MG PO CAPS
100.0000 mg | ORAL_CAPSULE | Freq: Two times a day (BID) | ORAL | Status: DC | PRN
  Administered 2014-01-09 – 2014-01-20 (×3): 100 mg via ORAL
  Filled 2014-01-09 (×3): qty 1

## 2014-01-09 NOTE — Progress Notes (Addendum)
    Subjective: Denies abdominal pain, N/V. No overt evidence of GI bleeding in past 24 hours per nursing staff. At baseline respiratory-wise.   Objective: Vital signs in last 24 hours: Temp:  [96 F (35.6 C)-98.2 F (36.8 C)] 98.2 F (36.8 C) (07/28 0000) Pulse Rate:  [92-105] 98 (07/28 0600) Resp:  [11-25] 16 (07/28 0700) BP: (79-129)/(41-89) 129/55 mmHg (07/28 0700) SpO2:  [90 %-100 %] 100 % (07/28 0600) Last BM Date: 01/08/14 General:   Sleeping but awakens easily, more alert and conversant than prior evaluation yesterday.  Head:  Normocephalic and atraumatic. Eyes:  Without icterus Lungs: clear to auscultation bilaterally Abdomen:  Obese, non-tender, large panniculus, difficult to appreciate HSM due to large AP diameter.  Extremities:  1-2+ edema bilateral lower extremities Neurologic:  Alert and  oriented x4  Intake/Output from previous day: 07/27 0701 - 07/28 0700 In: 965 [P.O.:240; Blood:725] Out: 1300 [Urine:1300] Intake/Output this shift:    Lab Results:  Recent Labs  01/06/14 1318 01/07/14 0200 01/07/14 1740  WBC 9.8 8.7 8.5  HGB 6.1* 7.8* 7.6*  HCT 20.9* 25.2* 24.5*  PLT 178 168 159   BMET  Recent Labs  01/06/14 1318 01/07/14 0200  NA 141 141  K 4.2 4.3  CL 97 97  CO2 37* 36*  GLUCOSE 122* 108*  BUN 19 20  CREATININE 1.08 1.05  CALCIUM 8.6 8.4   LFT  Recent Labs  01/07/14 0200 01/07/14 0500 01/08/14 0902  PROT 6.1  --  6.3  ALBUMIN 2.4*  --  2.5*  AST 32  --  28  ALT 11  --  11  ALKPHOS 138*  --  136*  BILITOT 4.1*  --  3.7*  BILIDIR  --  1.4* 1.5*  IBILI  --   --  2.2*    Assessment: 58 year old female admitted with acute on chronic CHF, anemia, heme positive stool with only low-volume hematochezia noted incidentally during turning yesterday, improved clinically from a cardiopulmonary standpoint and actually at her baseline requirement of 3 liters nasal cannula. Total of 4 units PRBCs this admission, with repeat CBC pending this  morning. Will tentatively plan on EGD 7/29 with Dr. Darrick PennaFields. Colonoscopy as outpatient.   Elevated LFTs: with normal transaminases. Likely secondary to known heart failure. Repeat LFTs pending  Hematochezia: recently noted during routine assessment by nursing, likely benign anorectal source, hx of hemorrhoids. Anusol added. No further evidence.   Plan: PPI BID EGD tentatively 7/29 with Dr. Val RilesFields Anusol BID per rectum Recheck CBC, HFP now Colonoscopy as outpatient Will continue to follow with you   Nira RetortAnna W. Sams, ANP-BC [facility]    LOS: 3 days    01/09/2014, 7:51 AM  Attending note:  Patient seen and examined. Cardiopulmonary status tenuous. Plan for a diagnostic EGD tomorrow-Dr. Darrick PennaFields. Our team will re-assess in the am.

## 2014-01-09 NOTE — Care Management Note (Addendum)
    Page 1 of 2   01/22/2014     1:55:35 PM CARE MANAGEMENT NOTE 01/22/2014  Patient:  [name] [name],[name] [name]   Account Number:  [id number]  Date Initiated:  01/09/2014  Documentation initiated by:  Sharrie RothmanBLACKWELL,TAMMY [name]  Subjective/Objective Assessment:   Pt admitted from home with anemia and UTI. Pt lives with her sister and will return home at discharge. [facility] Pt has a walker and pulse ox for home use. Pt has home O2 with [facility].     Action/Plan:   Pt would like w/[name] at discharge. Will arrange with agency of choice. Will also arrange HH with [facility] at discharge. Pt refusing SNF.   Anticipated DC Date:  01/22/2014   Anticipated DC Plan:  HOME W HOME HEALTH SERVICES      DC Planning Services  CM consult      [facility]  Resumption Of Svcs/PTA Provider  HOME HEALTH   Choice offered to / List presented to:  [name]-1 Patient   DME arranged  HOSPITAL BED  BIPAP  WHEELCHAIR - MANUAL      DME agency  [facility]     [facility]  HH-1 RN  HH-10 DISEASE MANAGEMENT  HH-4 NURSE'S AIDE      HH agency  [facility]   Status of service:  Completed, signed off Medicare Important Message given?   (If response is "NO", the following Medicare IM given date fields will be blank) Date Medicare IM given:   Medicare IM given by:   Date Additional Medicare IM given:   Additional Medicare IM given by:    Discharge Disposition:  HOME W HOME HEALTH SERVICES  Per UR Regulation:  Reviewed for med. necessity/level of care/duration of stay  If discussed at Long Length of Stay Meetings, dates discussed:   01/11/2014  01/16/2014  01/18/2014    Comments:  01/22/14 1330 Anibal [address] RN/CM [name] 01/19/14 1405 Arlyss Queenammy Blackwell, RN BSN CM Pt potential discharge over the weekend. HH RN and aide, hospital bed, bipap, and w/[name] arranged with [facility] (per pts choice). Alroy BailiffLinda LOthian of [facility] is aware and will collect pts information from the chart. HH services to  resume within 48 hours of discharge. Emma [facility] is aware of DME needs and will collect pts information from the chart. Weekend [facility] and [facility]. Pt and pts nurse aware of discharge arrangements.  01/12/14 1545 Arlyss Queenammy Blackwell, RN BSN CM Pt still requiring IV steroids and IV AB. Pt will be in hospital over the weekend.  01/11/2014 1050 Kathyrn SheriffJessica Childress, RN, MSN, PCCN  01/09/14 1600 Arlyss Queenammy Blackwell, RN BSN CM

## 2014-01-09 NOTE — Evaluation (Signed)
Physical Therapy Evaluation Patient Details Name: [name] [name] MRN: [id number] DOB: [birth year] Today's Date: 01/09/2014   History of Present Illness  58 year old female who has a past medical history of COPD (chronic obstructive pulmonary disease); Diabetes mellitus without complication; Hypertension; Thyroid disease; Hyperlipemia; Neuropathy; Depression; Chronic pain; Anxiety; CHF (congestive heart failure); Hemorrhoids; Chronic cough; Peripheral edema; and On home O2. Was brought to the ED for constant cough and worsening lower extremity swelling for past 2 weeks. Patient was recently discharged from the hospital on 12/14/2013 after being treated for CHF exacerbation. Patient is a poor historian, and she states that she has gradually developed lower extremity swelling, cough and also has been having shortness of breath. Patient is on home oxygen at 2 L per minute and the caregivers had to increase the oxygen to 4 L per minute. She denies fever, dysuria, intermittent nausea vomiting. Had loose bowel movements today, admits to having seen blood in the stool. Denies chest pain, no orthopnea or PND.   Clinical Impression  Pt is a 58 year old female who presents to physical therapy with dx of anemia.  Pt was recently discharged from hospital with recommendation for SNF, however family/patient declined and she went home.  During evaluation, pt extremely lethargic and required assist of PT and RN to awaken to participate in evaluation.  Assist x2 required to complete bed mobility skills.  Once at EOB, pt required initial assist to maintain balance, though eventually balance improved to fair and pt able to maintain sitting at EOB with (B) feet supported and single UE support and CGA from PT.  Assist x2 to transfer back to bed from PT and RN.  Recommend continued PT while in the hospital to address strengthening, balance, and activity tolerance for improvement in functional mobility skills with transition to SNF  at discharge.  No DME recommendations at this time, will defer to SNF.      Follow Up Recommendations SNF    Equipment Recommendations  None recommended by PT       Precautions / Restrictions Precautions Precautions: Fall Restrictions Weight Bearing Restrictions: No      Mobility  Bed Mobility Overal bed mobility: +2 for physical assistance;Needs Assistance Bed Mobility: Supine to Sit;Sit to Supine     Supine to sit: +2 for physical assistance;Max assist Sit to supine: +2 for physical assistance;Max assist   General bed mobility comments: Once at EOB, pt was more awake and able to maintain balance at EOB wtih (B) feet supported/1 UE assist for 5 minutes with CGA.    Transfers                 General transfer comment: Unable.   Ambulation/Gait             General Gait Details: Unable.      Balance Overall balance assessment: Needs assistance Sitting-balance support: Feet supported;Single extremity supported Sitting balance-Leahy Scale: Fair Sitting balance - Comments: Poor immediate sitting balance, though after pt awakened she was able to maintain balance with CGA at EOB                                     Pertinent Vitals/Pain No pain reported.     Home Living Family/patient expects to be discharged to:: Skilled nursing facility Living Arrangements: Alone               Additional Comments: Pt lives in  a single story home with 2-3 steps and (B) handrails to enter; pt does report her sister has been staying with her some.  She lives alone, though does have her sister, daughter, and friend who are able to help as needed.  DME: standard cane, tub shower, RW    Prior Function Level of Independence: Independent with assistive device(s)               Hand Dominance   Dominant Hand: Right    Extremity/Trunk Assessment               Lower Extremity Assessment: Generalized weakness;RLE deficits/detail;LLE  deficits/detail RLE Deficits / Details: MMT knee 3/5, ankle 3/5 LLE Deficits / Details: MMT knee 3/5, ankle 3/5     Communication   Communication: No difficulties  Cognition Arousal/Alertness: Lethargic (Required assist of PT and RN to awaken to participate in evaluation) Behavior During Therapy: [facility] for tasks assessed/performed Overall Cognitive Status: Within Functional Limits for tasks assessed                      General Comments      Exercises Total Joint Exercises Long Arc Quad: AROM;Both;10 reps;Seated      Assessment/Plan    PT Assessment Patient needs continued PT services  PT Diagnosis Difficulty walking;Generalized weakness   PT Problem List Decreased strength;Decreased activity tolerance;Decreased balance;Decreased mobility  PT Treatment Interventions Balance training;Gait training;Functional mobility training;Therapeutic activities;Therapeutic exercise;Patient/family education;Neuromuscular re-education   PT Goals (Current goals can be found in the Care Plan section) Acute Rehab PT Goals Patient Stated Goal: none stated    Frequency Min 3X/week   Barriers to discharge Inaccessible home environment;Decreased caregiver support         End of Session Equipment Utilized During Treatment: Gait belt;Oxygen Activity Tolerance: Patient limited by lethargy Patient left: in bed;with call bell/phone within reach Nurse Communication: Mobility status (RN assisted with bed mobility skills)         Time: 1110-1134 PT Time Calculation (min): 24 min   Charges:   PT Evaluation $Initial PT Evaluation Tier I: 1 Procedure      [name] [name] 01/09/2014, 11:59 AM

## 2014-01-09 NOTE — Clinical Social Work Psychosocial (Signed)
Clinical Social Work Department BRIEF PSYCHOSOCIAL ASSESSMENT 01/09/2014  Patient:  [name] [name], [name] [name]     Account Number:  [id number]     Admit date:  01/06/2014  Clinical Social Worker:  Wyatt Haste  Date/Time:  01/09/2014 01:12 PM  Referred by:  CSW  Date Referred:  01/09/2014 Referred for  SNF Placement   Other Referral:   Interview type:  Patient Other interview type:   sister- Dora    PSYCHOSOCIAL DATA Living Status:  FAMILY Admitted from facility:   Level of care:   Primary support name:  Dora Primary support relationship to patient:  SIBLING Degree of support available:   supportive    CURRENT CONCERNS Current Concerns  Post-Acute Placement   Other Concerns:    SOCIAL WORK ASSESSMENT / PLAN CSW met with pt at bedside. Pt alert and oriented and reports her sister has been living with her in her apartment since d/c from hospital last month. Pt's daughter also lives close by. Pt indicates that they have been managing at home. She has a home health RN that comes out about 2 times a week. Pt is on chronic oxygen. She sleeps in her recliner. Pt's sister, Sydell Axon said they have pt's potty chair right beside the recliner so they are able to transfer more easily. At baseline, pt ambulates with a cane. She has required extensive assist since last hospitalization. Pt indicates she is still on CAP aid wait list. PT evaluated pt today and recommendation is for SNF. CSW discussed this with pt and Sydell Axon and pt continues to refuse for same reasons as last hospital stay. She is not interested in going to facility, is worried about finances as she is Medicaid only, and does not want a roommate or to consider possible out of town placement. Dora plans to speak with PT about exercises she can assist pt with at home. SNF list left in room. Notified CM of pt's request for wheelchair. Family has been providing transport up until now, but feel it would be easier if pt could ride in wheelchair van  with [facility].   Assessment/plan status:  Psychosocial Support/Ongoing Assessment of Needs Other assessment/ plan:   Information/referral to community resources:   CM for equipment    PATIENT'S/FAMILY'S RESPONSE TO PLAN OF CARE: Pt refusing SNF placement. CM notified and to follow up.       Benay Pike, [place]

## 2014-01-09 NOTE — Progress Notes (Signed)
[name] [name] ZOX:[id number] DOB: [birth year] DOA: 01/06/2014 PCP: Avon Gully, MD   Subjective: This lady feels better after having received a total of 4 units of blood. Her breathing has also improved. She is due to tentatively be scheduled for an EGD perhaps tomorrow.           Physical Exam: Blood pressure 130/68, pulse 98, temperature 98.7 F (37.1 C), temperature source Axillary, resp. rate 12, height 5\' 5"  (1.651 m), weight 163 kg (359 lb 5.6 oz), SpO2 100.00%. She looks chronically sick but better than I seen her in the past. She has no respiratory distress at rest. She is at her baseline oxygen requirement which is 3-4 L per minute. Lung fields are actually clear. She does not look pale. Heart sounds are present in sinus rhythm. There is no gallop rhythm. She is alert and oriented.   Investigations:  Recent Results (from the past 240 hour(s))  URINE CULTURE     Status: None   Collection Time    01/06/14  2:02 PM      Result Value Ref Range Status   Specimen Description URINE, CATHETERIZED   Final   Special Requests NONE   Final   Culture  Setup Time     Final   Value: 01/06/2014 21:21     Performed at [facility] Count     Final   Value: >=100,000 COLONIES/ML     Performed at [facility]   Culture     Final   Value: KLEBSIELLA PNEUMONIAE     Performed at [facility]   Report Status 01/08/2014 FINAL   Final   Organism ID, Bacteria KLEBSIELLA PNEUMONIAE   Final  MRSA PCR SCREENING     Status: Abnormal   Collection Time    01/06/14  6:25 PM      Result Value Ref Range Status   MRSA by PCR POSITIVE (*) NEGATIVE Final   Comment:            The GeneXpert MRSA Assay (FDA     approved for NASAL specimens     only), is one component of a     comprehensive MRSA colonization     surveillance program. It is not     intended to diagnose MRSA     infection nor to guide or     monitor treatment for     MRSA infections.   CRITICAL RESULT CALLED TO, READ BACK BY AND VERIFIED WITH:     M.GRAY AT 2110 ON 01/06/14 BY S.VANHOORNE     Basic Metabolic Panel:  Recent Labs  81/19/14 1318 01/07/14 0200  NA 141 141  K 4.2 4.3  CL 97 97  CO2 37* 36*  GLUCOSE 122* 108*  BUN 19 20  CREATININE 1.08 1.05  CALCIUM 8.6 8.4   Liver Function Tests:  Recent Labs  01/07/14 0200 01/08/14 0902  AST 32 28  ALT 11 11  ALKPHOS 138* 136*  BILITOT 4.1* 3.7*  PROT 6.1 6.3  ALBUMIN 2.4* 2.5*     CBC:  Recent Labs  01/06/14 1318 01/07/14 0200 01/07/14 1740  WBC 9.8 8.7 8.5  NEUTROABS 7.7  --   --   HGB 6.1* 7.8* 7.6*  HCT 20.9* 25.2* 24.5*  MCV 104.0* 100.0 100.8*  PLT 178 168 159    No results found.    Medications: I have reviewed the patient's current medications.  Impression: 1. GI bleed, unclear etiology. 2. Acute blood  loss anemia, status post 4 units blood transfusion. 3. Acute on chronic diastolic congestive heart failure, clinically compensating. 4. Morbid obesity. 5. Klebsiella UTI on appropriate antibiotic therapy with ciprofloxacin.  6. Diabetes mellitus.     Plan: 1. Continue with current therapy. 2. Repeat chest x-ray this morning. 3. Repeat blood work this morning including CBC and basic metabolic panel. 4. Painful EGD tomorrow. 5. Patient can move to the regular telemetry floor.  Consultants:  Gastroenterology, Dr. Darrick Pennafields.   Procedures:  None.   Antibiotics:  Ciprofloxacin orally.                   Code Status: Full code.  Family Communication: I discussed the plan with the patient at the bedside.   Disposition Plan: Home when medically stable.  Time spent: 15 minutes.   LOS: 3 days   Lanny Lipkin C   01/09/2014, 8:18 AM

## 2014-01-10 ENCOUNTER — Encounter (HOSPITAL_COMMUNITY)
Admission: EM | Disposition: A | Discharge status: Home Health | Payer: Self-pay | Source: Home / Self Care | Attending: Family Medicine

## 2014-01-10 ENCOUNTER — Inpatient Hospital Stay (HOSPITAL_COMMUNITY): Payer: Medicaid Other

## 2014-01-10 LAB — BLOOD GAS, ARTERIAL
ACID-BASE EXCESS: 9.9 mmol/L — AB (ref 0.0–2.0)
Acid-Base Excess: 8.9 mmol/L — ABNORMAL HIGH (ref 0.0–2.0)
BICARBONATE: 37 meq/L — AB (ref 20.0–24.0)
BICARBONATE: 35.8 meq/L — AB (ref 20.0–24.0)
Delivery systems: POSITIVE
Drawn by: 25788
Drawn by: 25788
Expiratory PAP: 6
FIO2: 100 %
FIO2: 30 %
INSPIRATORY PAP: 18
Mode: POSITIVE
O2 SAT: 87.8 %
O2 Saturation: 94.6 %
PATIENT TEMPERATURE: 37
PCO2 ART: 68.2 mmHg — AB (ref 35.0–45.0)
PH ART: 7.19 — AB (ref 7.350–7.450)
PO2 ART: 77.8 mmHg — AB (ref 80.0–100.0)
Patient temperature: 37
TCO2: 36.3 mmol/L (ref 0–100)
TCO2: 33 mmol/L (ref 0–100)
pCO2 arterial: 101 mmHg (ref 35.0–45.0)
pH, Arterial: 7.339 — ABNORMAL LOW (ref 7.350–7.450)
pO2, Arterial: 52.4 mmHg — ABNORMAL LOW (ref 80.0–100.0)

## 2014-01-10 LAB — CBC
HCT: 31.5 % — ABNORMAL LOW (ref 36.0–46.0)
Hemoglobin: 9.3 g/dL — ABNORMAL LOW (ref 12.0–15.0)
MCH: 29.9 pg (ref 26.0–34.0)
MCHC: 29.5 g/dL — ABNORMAL LOW (ref 30.0–36.0)
MCV: 101.3 fL — AB (ref 78.0–100.0)
PLATELETS: 181 10*3/uL (ref 150–400)
RBC: 3.11 MIL/uL — AB (ref 3.87–5.11)
RDW: 23.3 % — ABNORMAL HIGH (ref 11.5–15.5)
WBC: 12.8 10*3/uL — ABNORMAL HIGH (ref 4.0–10.5)

## 2014-01-10 LAB — GLUCOSE, CAPILLARY
Glucose-Capillary: 102 mg/dL — ABNORMAL HIGH (ref 70–99)
Glucose-Capillary: 103 mg/dL — ABNORMAL HIGH (ref 70–99)
Glucose-Capillary: 109 mg/dL — ABNORMAL HIGH (ref 70–99)
Glucose-Capillary: 93 mg/dL (ref 70–99)

## 2014-01-10 LAB — TYPE AND SCREEN
ABO/RH(D): O NEG
Antibody Screen: NEGATIVE
UNIT DIVISION: 0
UNIT DIVISION: 0
Unit division: 0
Unit division: 0
Unit division: 0

## 2014-01-10 LAB — COMPREHENSIVE METABOLIC PANEL
ALT: 11 U/L (ref 0–35)
AST: 31 U/L (ref 0–37)
Albumin: 2.6 g/dL — ABNORMAL LOW (ref 3.5–5.2)
Alkaline Phosphatase: 134 U/L — ABNORMAL HIGH (ref 39–117)
Anion gap: 6 (ref 5–15)
BUN: 25 mg/dL — ABNORMAL HIGH (ref 6–23)
CALCIUM: 8.8 mg/dL (ref 8.4–10.5)
CO2: 40 meq/L — AB (ref 19–32)
Chloride: 98 mEq/L (ref 96–112)
Creatinine, Ser: 1.27 mg/dL — ABNORMAL HIGH (ref 0.50–1.10)
GFR, EST AFRICAN AMERICAN: 54 mL/min — AB (ref >90)
GFR, EST NON AFRICAN AMERICAN: 46 mL/min — AB (ref >90)
Glucose, Bld: 102 mg/dL — ABNORMAL HIGH (ref 70–99)
Potassium: 5.4 mEq/L — ABNORMAL HIGH (ref 3.7–5.3)
Sodium: 144 mEq/L (ref 137–147)
Total Bilirubin: 4.5 mg/dL — ABNORMAL HIGH (ref 0.3–1.2)
Total Protein: 6.5 g/dL (ref 6.0–8.3)

## 2014-01-10 LAB — TROPONIN I

## 2014-01-10 LAB — AMMONIA: AMMONIA: 18 umol/L (ref 11–60)

## 2014-01-10 SURGERY — EGD (ESOPHAGOGASTRODUODENOSCOPY)
Anesthesia: Moderate Sedation

## 2014-01-10 MED ORDER — SODIUM CHLORIDE 0.9 % IJ SOLN
3.0000 mL | INTRAMUSCULAR | Status: DC | PRN
  Administered 2014-01-10 (×2): 3 mL via INTRAVENOUS

## 2014-01-10 MED ORDER — FUROSEMIDE 10 MG/ML IJ SOLN
40.0000 mg | INTRAMUSCULAR | Status: AC
  Administered 2014-01-10: 40 mg via INTRAVENOUS
  Filled 2014-01-10: qty 4

## 2014-01-10 MED ORDER — ALBUTEROL SULFATE (2.5 MG/3ML) 0.083% IN NEBU
2.5000 mg | INHALATION_SOLUTION | RESPIRATORY_TRACT | Status: DC | PRN
  Administered 2014-01-10 – 2014-01-19 (×4): 2.5 mg via RESPIRATORY_TRACT
  Filled 2014-01-10 (×4): qty 3

## 2014-01-10 MED ORDER — SODIUM CHLORIDE 0.9 % IV SOLN: INTRAVENOUS | Status: DC

## 2014-01-10 MED ORDER — IPRATROPIUM-ALBUTEROL 0.5-2.5 (3) MG/3ML IN SOLN
3.0000 mL | RESPIRATORY_TRACT | Status: DC
  Administered 2014-01-10 – 2014-01-15 (×31): 3 mL via RESPIRATORY_TRACT
  Filled 2014-01-10 (×31): qty 3

## 2014-01-10 MED ORDER — CLONAZEPAM 0.5 MG PO TABS
0.5000 mg | ORAL_TABLET | Freq: Two times a day (BID) | ORAL | Status: DC | PRN
  Administered 2014-01-11 – 2014-01-22 (×10): 0.5 mg via ORAL
  Filled 2014-01-10 (×10): qty 1

## 2014-01-10 NOTE — Progress Notes (Signed)
UR chart review completed.  

## 2014-01-10 NOTE — Progress Notes (Signed)
Dr.Fields notified of CT results. Jill AlexandersJustin, patients son was given information and stated he would tell his sister in the waiting room.

## 2014-01-10 NOTE — Consult Note (Signed)
Consult requested by: Dr. Karilyn Cota Consult requested for respiratory failure:  HPI: This is a 58 year old who has a long known history of multiple medical problems and who had been in the hospital last month and earlier this month with congestive heart failure exacerbation. She was discharged on July 2. She developed increased lower extremity swelling cough shortness of breath and then had blood in her stool. When she was seen in the emergency department she was noted to have a hemoglobin level of 6.1. Her stool was positive for blood. She was also found to have elevated BNP. She has been undergoing workup for all of this and has had several units of blood which had improved her situation but then she was found this morning much less responsive on increased oxygen and she was transferred to the step down unit. She was placed on BiPAP. Her PCO2 was very high on blood gas. She has been somewhat more responsive later today at least with being upset with staff when she was getting a bath. She is not very responsive to me now. She was scheduled for EGD today which was canceled and she had CT of the brain which is negative for acute injury. Chest x-ray was consistent with pulmonary edema.  Past Medical History  Diagnosis Date  . COPD (chronic obstructive pulmonary disease)   . Diabetes mellitus without complication   . Hypertension   . Thyroid disease   . Hyperlipemia   . Neuropathy   . Depression   . Chronic pain   . Anxiety   . CHF (congestive heart failure)   . Hemorrhoids   . Chronic cough   . Peripheral edema   . On home O2     2L N/C     History reviewed. No pertinent family history.   History   Social History  . Marital Status: Divorced    Spouse Name: N/A    Number of Children: N/A  . Years of Education: N/A   Social History Main Topics  . Smoking status: Former Games developer  . Smokeless tobacco: None  . Alcohol Use: No  . Drug Use: No  . Sexual Activity: None   Other Topics  Concern  . None   Social History Narrative  . None     ROS: Not obtainable    Objective: Vital signs in last 24 hours: Temp:  [96.3 F (35.7 C)] 96.3 F (35.7 C) (07/29 0937) Pulse Rate:  [88-102] 95 (07/29 1730) Resp:  [11-25] 11 (07/29 1730) BP: (76-108)/(24-68) 99/46 mmHg (07/29 1730) SpO2:  [85 %-100 %] 100 % (07/29 1730) FiO2 (%):  [30 %-40 %] 40 % (07/29 1639) Weight:  [163.8 kg (361 lb 1.8 oz)] 163.8 kg (361 lb 1.8 oz) (07/29 0702) Weight change:  Last BM Date: 01/06/14  Intake/Output from previous day: 07/28 0701 - 07/29 0700 In: 300 [P.O.:300] Out: 825 [Urine:825]  PHYSICAL EXAM She is in the intensive care unit. She looks comfortable she's poorly responsive she is on BiPAP. Pupils react. Nose and throat are clear. Her chest shows some fine rales bilaterally. Her heart is regular to exam. Her abdomen is soft obese without masses. Extremities showed 1-2+ edema which is chronic. Central nervous system exam shows she's poorly responsive.  Lab Results: Basic Metabolic Panel:  Recent Labs  16/10/96 0830 01/10/14 0555  NA 143 144  K 4.3 5.4*  CL 98 98  CO2 39* 40*  GLUCOSE 113* 102*  BUN 21 25*  CREATININE 1.07 1.27*  CALCIUM 8.9  8.8   Liver Function Tests:  Recent Labs  01/08/14 0902 01/10/14 0555  AST 28 31  ALT 11 11  ALKPHOS 136* 134*  BILITOT 3.7* 4.5*  PROT 6.3 6.5  ALBUMIN 2.5* 2.6*   No results found for this basename: LIPASE, AMYLASE,  in the last 72 hours No results found for this basename: AMMONIA,  in the last 72 hours CBC:  Recent Labs  01/09/14 0830 01/10/14 0555  WBC 8.9 12.8*  HGB 9.7* 9.3*  HCT 31.7* 31.5*  MCV 98.1 101.3*  PLT 160 181   Cardiac Enzymes:  Recent Labs  01/10/14 0848 01/10/14 1426  TROPONINI <0.30 <0.30   BNP: No results found for this basename: PROBNP,  in the last 72 hours D-Dimer: No results found for this basename: DDIMER,  in the last 72 hours CBG:  Recent Labs  01/09/14 1135  01/09/14 1651 01/10/14 0024 01/10/14 0740 01/10/14 1149 01/10/14 1640  GLUCAP 115* 104* 109* 103* 93 102*   Hemoglobin A1C: No results found for this basename: HGBA1C,  in the last 72 hours Fasting Lipid Panel: No results found for this basename: CHOL, HDL, LDLCALC, TRIG, CHOLHDL, LDLDIRECT,  in the last 72 hours Thyroid Function Tests: No results found for this basename: TSH, T4TOTAL, FREET4, T3FREE, THYROIDAB,  in the last 72 hours Anemia Panel: No results found for this basename: VITAMINB12, FOLATE, FERRITIN, TIBC, IRON, RETICCTPCT,  in the last 72 hours Coagulation: No results found for this basename: LABPROT, INR,  in the last 72 hours Urine Drug Screen: Drugs of Abuse  No results found for this basename: labopia, cocainscrnur, labbenz, amphetmu, thcu, labbarb    Alcohol Level: No results found for this basename: ETH,  in the last 72 hours Urinalysis: No results found for this basename: COLORURINE, APPERANCEUR, LABSPEC, PHURINE, GLUCOSEU, HGBUR, BILIRUBINUR, KETONESUR, PROTEINUR, UROBILINOGEN, NITRITE, LEUKOCYTESUR,  in the last 72 hours Misc. Labs:   ABGS:  Recent Labs  01/10/14 1135  PHART 7.339*  PO2ART 52.4*  TCO2 33.0  HCO3 35.8*     MICROBIOLOGY: Recent Results (from the past 240 hour(s))  URINE CULTURE     Status: None   Collection Time    01/06/14  2:02 PM      Result Value Ref Range Status   Specimen Description URINE, CATHETERIZED   Final   Special Requests NONE   Final   Culture  Setup Time     Final   Value: 01/06/2014 21:21     Performed at [facility]   Colony Count     Final   Value: >=100,000 COLONIES/ML     Performed at [facility]   Culture     Final   Value: KLEBSIELLA PNEUMONIAE     Performed at [facility]   Report Status 01/08/2014 FINAL   Final   Organism ID, Bacteria KLEBSIELLA PNEUMONIAE   Final  MRSA PCR SCREENING     Status: Abnormal   Collection Time    01/06/14  6:25 PM      Result Value Ref  Range Status   MRSA by PCR POSITIVE (*) NEGATIVE Final   Comment:            The GeneXpert MRSA Assay (FDA     approved for NASAL specimens     only), is one component of a     comprehensive MRSA colonization     surveillance program. It is not     intended to diagnose MRSA  infection nor to guide or     monitor treatment for     MRSA infections.     CRITICAL RESULT CALLED TO, READ BACK BY AND VERIFIED WITH:     M.GRAY AT 2110 ON 01/06/14 BY S.VANHOORNE    Studies/Results: Dg Chest 1 View  01/09/2014   CLINICAL DATA:  Followup of congestive heart failure.  EXAM: CHEST - 1 VIEW  COMPARISON:  01/06/2014 and 12/06/2013  FINDINGS: Stable cardiomegaly. Diffuse pulmonary vascular congestion. There are bilateral interstitial and airspace opacities, most prominent perihilar regions and there saline hazy opacities bilaterally suggestive of bilateral posteriorly layering pleural effusions. Overall, aeration of the lungs appears slightly worse on today's chest radiograph compared to 01/06/2014.  Low right peritracheal soft tissue density measures up to 3 cm transverse dimension and could reflect distended azygos/ SVC in the setting of pulmonary venous hypertension, but lymphadenopathy or mass cannot be excluded.  IMPRESSION: Moderate to severe congestive heart failure pattern. Bilateral layering pleural effusions.  Soft tissue prominence in the lower right paratracheal region could be related to pulmonary venous congestion, but lymphadenopathy or mass cannot be completely excluded. Recommend attention on short-term follow-up PA and lateral chest radiograph with the patient's congestive heart failure improves.  These results will be called to the ordering clinician or representative by the Radiologist Assistant, and communication documented in the PACS or zVision Dashboard.   Electronically Signed   By: Britta Mccreedy M.D.   On: 01/09/2014 09:20   Ct Head Wo Contrast  01/10/2014   CLINICAL DATA:  Mental  status changes  EXAM: CT HEAD WITHOUT CONTRAST  TECHNIQUE: Contiguous axial images were obtained from the base of the skull through the vertex without intravenous contrast.  COMPARISON:  None.  FINDINGS: No skull fracture is noted. Paranasal sinuses and mastoid air cells are unremarkable. There are motion artifacts.  No intracranial hemorrhage, mass effect or midline shift. No definite acute cortical infarction. No mass lesion is noted on this unenhanced scan. No hydrocephalus.  IMPRESSION: No acute intracranial abnormality. Motion artifacts are noted. No definite acute cortical infarction.   Electronically Signed   By: Natasha Mead M.D.   On: 01/10/2014 15:43   Dg Chest Port 1 View  01/10/2014   CLINICAL DATA:  Acute respiratory failure.  EXAM: PORTABLE CHEST - 1 VIEW  COMPARISON:  01/09/2014.  FINDINGS: Patient is rotated to the right making evaluation mediastinum difficult today's exam . Severe cardiomegaly with pulmonary vascular prominence and bilateral alveolar infiltrates consistent with congestive heart failure with pulmonary edema. Bilateral effusions are present. Similar findings on prior exam. No pneumothorax is No acute bony abnormality identified.  IMPRESSION: Congestive heart failure with bilateral pulmonary edema and pleural effusions. Similar findings noted on prior study.   Electronically Signed   By: Maisie Fus  Register   On: 01/10/2014 09:24    Medications:  Prior to Admission:  Prescriptions prior to admission  Medication Sig Dispense Refill  . albuterol (PROVENTIL) (2.5 MG/3ML) 0.083% nebulizer solution Take 3 mLs (2.5 mg total) by nebulization every 4 (four) hours as needed for wheezing or shortness of breath.  75 mL  12  . aspirin EC 81 MG EC tablet Take 1 tablet (81 mg total) by mouth daily.  30 tablet  12  . clonazePAM (KLONOPIN) 1 MG tablet Take 0.5 tablets (0.5 mg total) by mouth 4 (four) times daily as needed for anxiety.  30 tablet  0  . DULoxetine (CYMBALTA) 20 MG capsule Take 1  capsule (20 mg total)  by mouth daily.  30 capsule  3  . folic acid (FOLVITE) 1 MG tablet Take 1 tablet (1 mg total) by mouth daily.  30 tablet  12  . furosemide (LASIX) 40 MG tablet Take 1 tablet (40 mg total) by mouth daily.  30 tablet  1  . HYDROcodone-acetaminophen (NORCO/VICODIN) 5-325 MG per tablet Take 1 tablet by mouth every 4 (four) hours as needed for moderate pain.      Marland Kitchen insulin lispro (HUMALOG) 100 UNIT/ML injection Inject 2-10 Units into the skin 3 (three) times daily before meals. SS      . levothyroxine (SYNTHROID, LEVOTHROID) 200 MCG tablet Take 200 mcg by mouth daily before breakfast.      . lisinopril (PRINIVIL,ZESTRIL) 5 MG tablet Take 1 tablet (5 mg total) by mouth daily.  30 tablet  1  . PARoxetine (PAXIL) 20 MG tablet Take 20 mg by mouth every morning.      . potassium chloride SA (K-DUR,KLOR-CON) 20 MEQ tablet Take 40 mEq by mouth daily.      . promethazine (PHENERGAN) 25 MG tablet Take 25 mg by mouth every 6 (six) hours as needed for nausea or vomiting.      . simvastatin (ZOCOR) 40 MG tablet Take 40 mg by mouth every morning.       . temazepam (RESTORIL) 15 MG capsule Take 15 mg by mouth at bedtime as needed for sleep.       Scheduled: . antiseptic oral rinse  15 mL Mouth Rinse BID  . antiseptic oral rinse  15 mL Mouth Rinse BID  . aspirin EC  81 mg Oral Daily  . Chlorhexidine Gluconate Cloth  6 each Topical Q0600  . ciprofloxacin  500 mg Oral BID  . DULoxetine  20 mg Oral Daily  . folic acid  1 mg Oral Daily  . furosemide  20 mg Intravenous Q12H  . hydrocortisone   Rectal BID  . insulin aspart  0-20 Units Subcutaneous TID WC  . ipratropium-albuterol  3 mL Nebulization Q4H  . levothyroxine  200 mcg Oral QAC breakfast  . mupirocin ointment  1 application Nasal BID  . nystatin   Topical BID  . pantoprazole  40 mg Oral BID AC  . PARoxetine  20 mg Oral Daily  . simvastatin  40 mg Oral q morning - 10a   Continuous: . sodium chloride Stopped (01/10/14 1500)  .  sodium chloride     WGN:FAOZHYQMV, benzonatate, clonazePAM, HYDROcodone-acetaminophen, ondansetron (ZOFRAN) IV, ondansetron  Assesment: She was admitted with anemia and has had several units of blood transfused. She has multiple other medical problems and now has acute on chronic respiratory failure. Her pH was 7.19 early this morning and improved to 7.33 with BiPAP. Her PCO2 came down from 101-68.2 she has not regained her previous level of consciousness however. She does not appear to have had a stroke based on her CT. Active Problems:   Anemia    Plan: Continue BiPAP. Continue other treatments.   Recheck labs  In am. I think she's had trouble in the past with liver function sewing ammonia level may be helpful and I will go ahead and order that.    LOS: 4 days   Malya Cirillo L 01/10/2014, 5:56 PM

## 2014-01-10 NOTE — Progress Notes (Signed)
Dr Karilyn CotaGosrani notified by phone of pt Blood gas values Pt ordered to stepdown bed Report called to Tobi BastosAnna RN ICU Pt transferred to room 4 ICU Family notified

## 2014-01-10 NOTE — Progress Notes (Signed)
ANTIBIOTIC CONSULT NOTE  Pharmacy Consult for Cipro Indication: UTI  Allergies  Allergen Reactions  . Codeine Nausea And Vomiting  . Keflex [Cephalexin] Hives and Nausea And Vomiting  . Penicillins Nausea And Vomiting   Patient Measurements: Height: 5\' 5"  (165.1 cm) Weight: 361 lb 1.8 oz (163.8 kg) IBW/kg (Calculated) : 57  Vital Signs: Temp: 96.3 F (35.7 C) (07/29 0937) Temp src: Axillary (07/29 0937) BP: 103/56 mmHg (07/29 0937) Pulse Rate: 93 (07/29 1103) Intake/Output from previous day: 07/28 0701 - 07/29 0700 In: 300 [P.O.:300] Out: 825 [Urine:825] Intake/Output from this shift: Total I/O In: -  Out: 500 [Urine:500]  Labs:  Recent Labs  01/07/14 1740 01/09/14 0830 01/10/14 0555  WBC 8.5 8.9 12.8*  HGB 7.6* 9.7* 9.3*  PLT 159 160 181  CREATININE  --  1.07 1.27*   Estimated Creatinine Clearance: 77.8 ml/min (by C-G formula based on Cr of 1.27). No results found for this basename: Rolm Gala, VANCORANDOM, GENTTROUGH, GENTPEAK, GENTRANDOM, TOBRATROUGH, TOBRAPEAK, TOBRARND, AMIKACINPEAK, AMIKACINTROU, AMIKACIN,  in the last 72 hours   Microbiology: Recent Results (from the past 720 hour(s))  URINE CULTURE     Status: None   Collection Time    01/06/14  2:02 PM      Result Value Ref Range Status   Specimen Description URINE, CATHETERIZED   Final   Special Requests NONE   Final   Culture  Setup Time     Final   Value: 01/06/2014 21:21     Performed at [facility] Count     Final   Value: >=100,000 COLONIES/ML     Performed at [facility]   Culture     Final   Value: KLEBSIELLA PNEUMONIAE     Performed at [facility]   Report Status 01/08/2014 FINAL   Final   Organism ID, Bacteria KLEBSIELLA PNEUMONIAE   Final  MRSA PCR SCREENING     Status: Abnormal   Collection Time    01/06/14  6:25 PM      Result Value Ref Range Status   MRSA by PCR POSITIVE (*) NEGATIVE Final   Comment:            The GeneXpert  MRSA Assay (FDA     approved for NASAL specimens     only), is one component of a     comprehensive MRSA colonization     surveillance program. It is not     intended to diagnose MRSA     infection nor to guide or     monitor treatment for     MRSA infections.     CRITICAL RESULT CALLED TO, READ BACK BY AND VERIFIED WITH:     M.GRAY AT 2110 ON 01/06/14 BY S.VANHOORNE   Medical History: Past Medical History  Diagnosis Date  . COPD (chronic obstructive pulmonary disease)   . Diabetes mellitus without complication   . Hypertension   . Thyroid disease   . Hyperlipemia   . Neuropathy   . Depression   . Chronic pain   . Anxiety   . CHF (congestive heart failure)   . Hemorrhoids   . Chronic cough   . Peripheral edema   . On home O2     2L N/C   Cipro 7/25 >>  Assessment: 58yo morbidly obese female admitted with COPD, LE swelling, and UTI. Urine cx+ Klebsiella sensitive to Cipro. Renal fxn has been stable.  Normalized CrCl ~ 55-60 ml/min.  Plan:  Continue Cipro 500mg  po bid Duration of therapy per MD No further dose adjustments anticipated. Pharmacy to sign off.  Please re-consult as needed.   [name] Green, Anvay Green [name] 01/10/2014,11:30 AM

## 2014-01-10 NOTE — Progress Notes (Signed)
[name] Green [id number] DOB: 09/01/1955 DOA: 01/06/2014 PCP: Avon GullyFANTA,TESFAYE, MD   Subjective: This lady overnight has deteriorated. I do not see any documentation of her deterioration. She appears to be on 15 L oxygen with nonrebreather mask.           Physical Exam: Blood pressure 94/60, pulse 94, temperature 98.4 F (36.9 C), temperature source Axillary, resp. rate 14, height 5\' 5"  (1.651 m), weight 163.8 kg (361 lb 1.8 oz), SpO2 100.00%. She is barely arousable with painful stimuli to the sternum. Peripheral pulses are weak. She is hypertensive. She is somewhat clammy.  Investigations:  Recent Results (from the past 240 hour(s))  URINE CULTURE     Status: None   Collection Time    01/06/14  2:02 PM      Result Value Ref Range Status   Specimen Description URINE, CATHETERIZED   Final   Special Requests NONE   Final   Culture  Setup Time     Final   Value: 01/06/2014 21:21     Performed at [facility]   Colony Count     Final   Value: >=100,000 COLONIES/ML     Performed at [facility]   Culture     Final   Value: KLEBSIELLA PNEUMONIAE     Performed at [facility]   Report Status 01/08/2014 FINAL   Final   Organism ID, Bacteria KLEBSIELLA PNEUMONIAE   Final  MRSA PCR SCREENING     Status: Abnormal   Collection Time    01/06/14  6:25 PM      Result Value Ref Range Status   MRSA by PCR POSITIVE (*) NEGATIVE Final   Comment:            The GeneXpert MRSA Assay (FDA     approved for NASAL specimens     only), is one component of a     comprehensive MRSA colonization     surveillance program. It is not     intended to diagnose MRSA     infection nor to guide or     monitor treatment for     MRSA infections.     CRITICAL RESULT CALLED TO, READ BACK BY AND VERIFIED WITH:     M.GRAY AT 2110 ON 01/06/14 BY S.VANHOORNE     Basic Metabolic Panel:  Recent Labs  95/62/1307/28/15 0830 01/10/14 0555  NA 143 144  K 4.3 5.4*  CL 98 98  CO2  39* 40*  GLUCOSE 113* 102*  BUN 21 25*  CREATININE 1.07 1.27*  CALCIUM 8.9 8.8   Liver Function Tests:  Recent Labs  01/08/14 0902 01/10/14 0555  AST 28 31  ALT 11 11  ALKPHOS 136* 134*  BILITOT 3.7* 4.5*  PROT 6.3 6.5  ALBUMIN 2.5* 2.6*     CBC:  Recent Labs  01/09/14 0830 01/10/14 0555  WBC 8.9 12.8*  HGB 9.7* 9.3*  HCT 31.7* 31.5*  MCV 98.1 101.3*  PLT 160 181    Dg Chest 1 View  01/09/2014   CLINICAL DATA:  Followup of congestive heart failure.  EXAM: CHEST - 1 VIEW  COMPARISON:  01/06/2014 and 12/06/2013  FINDINGS: Stable cardiomegaly. Diffuse pulmonary vascular congestion. There are bilateral interstitial and airspace opacities, most prominent perihilar regions and there saline hazy opacities bilaterally suggestive of bilateral posteriorly layering pleural effusions. Overall, aeration of the lungs appears slightly worse on today's chest radiograph compared to 01/06/2014.  Low right peritracheal soft tissue density measures  up to 3 cm transverse dimension and could reflect distended azygos/ SVC in the setting of pulmonary venous hypertension, but lymphadenopathy or mass cannot be excluded.  IMPRESSION: Moderate to severe congestive heart failure pattern. Bilateral layering pleural effusions.  Soft tissue prominence in the lower right paratracheal region could be related to pulmonary venous congestion, but lymphadenopathy or mass cannot be completely excluded. Recommend attention on short-term follow-up PA and lateral chest radiograph with the patient's congestive heart failure improves.  These results will be called to the ordering clinician or representative by the Radiologist Assistant, and communication documented in the PACS or zVision Dashboard.   Electronically Signed   By: Britta Mccreedy M.D.   On: 01/09/2014 09:20      Medications: I have reviewed the patient's current medications.  Impression: 1. Acute respiratory failure, unclear etiology. 2. Upper GI bleed,  unclear etiology. 3. Acute blood loss anemia, status post 4 units blood transfusion. 4. Klebsiella UTI . On ciprofloxacin. 5. History of acute on chronic diastolic congestive heart failure, possibly appears to be in heart failure now. 6. Diabetes mellitus. 7. Morbid obesity.   Plan: 1. Stat ABG. Stat chest x-ray. 2. Move to the step down unit. 3. Cancel EGD this morning.  Consultants:  Gastroenterology, Dr. Darrick Penna.   Procedures:  None.   Antibiotics:  Ciprofloxacin orally.                   Code Status: Full code.    Disposition Plan: Home when medically stable.  Time spent: 15 minutes.   LOS: 4 days   Kella Splinter C   01/10/2014, 8:25 AM

## 2014-01-10 NOTE — Progress Notes (Signed)
PT Cancellation Note  Patient Details Name: Elayne Snareunice C Busk MRN: [id number] DOB: [birth year]   Cancelled Treatment:    Reason Eval/Treat Not Completed: Medical issues which prohibited therapy  Reviewed chart and noted decline in medical status as pt was transported from floor to ICU.  Pt to be discharged from PT services at this time for decline in status.  Will need new authorization from medical doctor to resume Pt services when pt is stable.     Emigdio Wildeman 01/10/2014, 11:12 AM

## 2014-01-10 NOTE — Progress Notes (Signed)
CRITICAL VALUE ALERT  Critical value received:  CO2  Date of notification:  01/10/14  Time of notification:  0715  Critical value read back:yes  Nurse who received alert:  Everlene FarrierBrandi Gavin  MD notified (1st page):  Karilyn CotaGosrani  Time of first page:  0740  MD notified (2nd page): Karilyn CotaGosrani  Time of second page: 0745  Responding MD: Karilyn CotaGosrani  Time MD responded:  [phone]

## 2014-01-10 NOTE — Progress Notes (Signed)
Pt transported from floor to ICU and placed back on BIPAP.  Pt tolerating well at this time.  RT will continue to monitor.

## 2014-01-10 NOTE — Progress Notes (Signed)
Subjective: Overnight placed on non-rebreather. No documentation of events leading to this. Does not open eyes to verbal stimuli. Somnolent. Sister at bedside.   Objective: Vital signs in last 24 hours: Temp:  [96.3 F (35.7 C)-98.4 F (36.9 C)] 96.3 F (35.7 C) (07/29 0937) Pulse Rate:  [88-102] 89 (07/29 0937) Resp:  [11-22] 13 (07/29 0937) BP: (94-127)/(53-103) 103/56 mmHg (07/29 0937) SpO2:  [56 %-100 %] 87 % (07/29 0937) Weight:  [361 lb 1.8 oz (163.8 kg)] 361 lb 1.8 oz (163.8 kg) (07/29 0702) Last BM Date: 01/06/14 General:   Somnolent, O2 via non-rebreather  Abdomen:  Bowel sounds present, obese with large panniculus, generalized anasarca, no TTP Extremities:  2+ lower extremity edema Neurologic:  Unable to assess    Intake/Output from previous day: 07/28 0701 - 07/29 0700 In: 300 [P.O.:300] Out: 825 [Urine:825] Intake/Output this shift: Total I/O In: -  Out: 500 [Urine:500]  Lab Results:  Recent Labs  01/07/14 1740 01/09/14 0830 01/10/14 0555  WBC 8.5 8.9 12.8*  HGB 7.6* 9.7* 9.3*  HCT 24.5* 31.7* 31.5*  PLT 159 160 181   BMET  Recent Labs  01/09/14 0830 01/10/14 0555  NA 143 144  K 4.3 5.4*  CL 98 98  CO2 39* 40*  GLUCOSE 113* 102*  BUN 21 25*  CREATININE 1.07 1.27*  CALCIUM 8.9 8.8   LFT  Recent Labs  01/08/14 0902 01/10/14 0555  PROT 6.3 6.5  ALBUMIN 2.5* 2.6*  AST 28 31  ALT 11 11  ALKPHOS 136* 134*  BILITOT 3.7* 4.5*  BILIDIR 1.5*  --   IBILI 2.2*  --      Studies/Results: Dg Chest 1 View  01/09/2014   CLINICAL DATA:  Followup of congestive heart failure.  EXAM: CHEST - 1 VIEW  COMPARISON:  01/06/2014 and 12/06/2013  FINDINGS: Stable cardiomegaly. Diffuse pulmonary vascular congestion. There are bilateral interstitial and airspace opacities, most prominent perihilar regions and there saline hazy opacities bilaterally suggestive of bilateral posteriorly layering pleural effusions. Overall, aeration of the lungs appears  slightly worse on today's chest radiograph compared to 01/06/2014.  Low right peritracheal soft tissue density measures up to 3 cm transverse dimension and could reflect distended azygos/ SVC in the setting of pulmonary venous hypertension, but lymphadenopathy or mass cannot be excluded.  IMPRESSION: Moderate to severe congestive heart failure pattern. Bilateral layering pleural effusions.  Soft tissue prominence in the lower right paratracheal region could be related to pulmonary venous congestion, but lymphadenopathy or mass cannot be completely excluded. Recommend attention on short-term follow-up PA and lateral chest radiograph with the patient's congestive heart failure improves.  These results will be called to the ordering clinician or representative by the Radiologist Assistant, and communication documented in the PACS or zVision Dashboard.   Electronically Signed   By: Britta MccreedySusan  Turner M.D.   On: 01/09/2014 09:20   Dg Chest Port 1 View  01/10/2014   CLINICAL DATA:  Acute respiratory failure.  EXAM: PORTABLE CHEST - 1 VIEW  COMPARISON:  01/09/2014.  FINDINGS: Patient is rotated to the right making evaluation mediastinum difficult today's exam . Severe cardiomegaly with pulmonary vascular prominence and bilateral alveolar infiltrates consistent with congestive heart failure with pulmonary edema. Bilateral effusions are present. Similar findings on prior exam. No pneumothorax is No acute bony abnormality identified.  IMPRESSION: Congestive heart failure with bilateral pulmonary edema and pleural effusions. Similar findings noted on prior study.   Electronically Signed   By: Maisie Fushomas  Register  On: 01/10/2014 09:24    Assessment: 58 year old female admitted with acute on chronic CHF, anemia, heme positive stool with only low-volume hematochezia noted incidentally this admission, now with worsening cardiopulmonary status overnight. No documentation of this in medical record; she is now on a non-rebreather.  Transfer to step-down in process. EGD cancelled.    Elevated LFTs: with normal transaminases. Likely secondary to known heart failure. Continue to follow   Hematochezia: recently noted during routine assessment by nursing, likely benign anorectal source, hx of hemorrhoids. Anusol added. No further evidence  Acute respiratory failure: transfer to ICU, attending aware. Further management per attending.     Plan: PPI BID Hold on EGD Anusol BID per rectum Colonoscopy as outpatient Will follow peripherally with hopes of EGD prior to discharge  Nira Retort, ANP-BC [facility]    LOS: 4 days    01/10/2014, 9:49 AM

## 2014-01-10 NOTE — Progress Notes (Signed)
REVIEWED. Pt on BIPAP. LAST ABG SHOWS pCO2 68.2, pO2 52.4. Mental status has not improved to baseline. CT OF HEAD today.

## 2014-01-11 ENCOUNTER — Inpatient Hospital Stay (HOSPITAL_COMMUNITY): Payer: Medicaid Other

## 2014-01-11 LAB — BLOOD GAS, ARTERIAL
Acid-Base Excess: 12.4 mmol/L — ABNORMAL HIGH (ref 0.0–2.0)
Bicarbonate: 38.1 mEq/L — ABNORMAL HIGH (ref 20.0–24.0)
DELIVERY SYSTEMS: POSITIVE
Drawn by: 21310
EXPIRATORY PAP: 6
FIO2: 40 %
INSPIRATORY PAP: 18
O2 SAT: 98.2 %
PATIENT TEMPERATURE: 37
PO2 ART: 99.4 mmHg (ref 80.0–100.0)
TCO2: 36.6 mmol/L (ref 0–100)
pCO2 arterial: 67.4 mmHg (ref 35.0–45.0)
pH, Arterial: 7.371 (ref 7.350–7.450)

## 2014-01-11 LAB — GLUCOSE, CAPILLARY
GLUCOSE-CAPILLARY: 95 mg/dL (ref 70–99)
GLUCOSE-CAPILLARY: 97 mg/dL (ref 70–99)
Glucose-Capillary: 98 mg/dL (ref 70–99)
Glucose-Capillary: 90 mg/dL (ref 70–99)
Glucose-Capillary: 94 mg/dL (ref 70–99)

## 2014-01-11 LAB — COMPREHENSIVE METABOLIC PANEL
ALBUMIN: 2.4 g/dL — AB (ref 3.5–5.2)
ALT: 11 U/L (ref 0–35)
AST: 29 U/L (ref 0–37)
Alkaline Phosphatase: 117 U/L (ref 39–117)
Anion gap: 6 (ref 5–15)
BUN: 32 mg/dL — ABNORMAL HIGH (ref 6–23)
CO2: 40 mEq/L (ref 19–32)
Calcium: 9 mg/dL (ref 8.4–10.5)
Chloride: 99 mEq/L (ref 96–112)
Creatinine, Ser: 1.47 mg/dL — ABNORMAL HIGH (ref 0.50–1.10)
GFR calc non Af Amer: 39 mL/min — ABNORMAL LOW (ref >90)
GFR, EST AFRICAN AMERICAN: 45 mL/min — AB (ref >90)
GLUCOSE: 99 mg/dL (ref 70–99)
Potassium: 4.5 mEq/L (ref 3.7–5.3)
Sodium: 145 mEq/L (ref 137–147)
Total Bilirubin: 3.1 mg/dL — ABNORMAL HIGH (ref 0.3–1.2)
Total Protein: 6.1 g/dL (ref 6.0–8.3)

## 2014-01-11 LAB — CBC
HCT: 27.5 % — ABNORMAL LOW (ref 36.0–46.0)
HEMOGLOBIN: 8.3 g/dL — AB (ref 12.0–15.0)
MCH: 30.3 pg (ref 26.0–34.0)
MCHC: 30.2 g/dL (ref 30.0–36.0)
MCV: 100.4 fL — ABNORMAL HIGH (ref 78.0–100.0)
Platelets: 137 10*3/uL — ABNORMAL LOW (ref 150–400)
RBC: 2.74 MIL/uL — ABNORMAL LOW (ref 3.87–5.11)
RDW: 23.4 % — ABNORMAL HIGH (ref 11.5–15.5)
WBC: 8.4 10*3/uL (ref 4.0–10.5)

## 2014-01-11 NOTE — Consult Note (Signed)
[place] A. Merlene Laughter, MD     [email or web address]          [name] Green is an 58 y.o. female.   ASSESSMENT/PLAN: 1. Toxic metabolic encephalopathy. The culprits are likely multifactorial but the most significant finding is due to markedly elevated carbon dioxide. The patient likely has carbon dioxide narcosis. Other etiologies includes urinary tract infection, congestive heart failure and dehydration. The patient's cognition should improve once all of her acute medical illness is having improved. There is likely to be a lack however of her cognition improving.  Assessment 58 year old white female who has multiple comorbidities. The patient presents with swelling of the legs. She is noted to have altered mental status on admission. The workup has been significant for congestive heart failure, urine check infection and worsening of baseline cognitive obstructive poor disease. Patient has been noted to have a markedly elevated PCO2 0101. There is no documented baseline that I can see in the chart. She suspected of being a CO2 retainer but again this has not been documented. It appears that the PCO2 was worse with 100% nonrebreather. With BiPAP it has been reduced to 67 but Again, this seems quite elevated especially in light that we don't have her baseline. She usually is functional and has normal cognition at baseline. Limited review of systems because of cognition.   GENERAL: She is on a BiPAP.  HEENT: Supple. Atraumatic normocephalic.   ABDOMEN: soft  EXTREMITIES: No edema   BACK: Normal.  SKIN: Normal by inspection.    MENTAL STATUS: She lays in bed with eyes closed. She opens her eyes to deep painful stimuli. She does briefly focuses and attends. She does follow commands on both sides in the arms and legs.  CRANIAL NERVES: Pupils are equal, round and reactive to light; extra ocular movements are full, there is no significant nystagmus; visual fields - Limited but  appears to be full; upper and lower facial muscles are normal in strength and symmetric, there is no flattening of the nasolabial folds.  MOTOR: She has antigravity strength in the upper extremities. Cervical the toes bilaterally.  COORDINATION: There is no dysmetria. There Are no tremors.  REFLEXES: Deep tendon reflexes are symmetrical and normal.   SENSATION: Normal to Painful stimuli.    Past Medical History  Diagnosis Date  . COPD (chronic obstructive pulmonary disease)   . Diabetes mellitus without complication   . Hypertension   . Thyroid disease   . Hyperlipemia   . Neuropathy   . Depression   . Chronic pain   . Anxiety   . CHF (congestive heart failure)   . Hemorrhoids   . Chronic cough   . Peripheral edema   . On home O2     2L N/C    Past Surgical History  Procedure Laterality Date  . Cholecystectomy      History reviewed. No pertinent family history.  Social History:  reports that she has quit smoking. She does not have any smokeless tobacco history on file. She reports that she does not drink alcohol or use illicit drugs.  Allergies:  Allergies  Allergen Reactions  . Codeine Nausea And Vomiting  . Keflex [Cephalexin] Hives and Nausea And Vomiting  . Penicillins Nausea And Vomiting    Medications: Prior to Admission medications   Medication Sig Start Date End Date Taking? Authorizing Provider  albuterol (PROVENTIL) (2.5 MG/3ML) 0.083% nebulizer solution Take 3 mLs (2.5 mg total) by nebulization every 4 (four) hours as needed  for wheezing or shortness of breath. 12/14/13  Yes Alonza Bogus, MD  aspirin EC 81 MG EC tablet Take 1 tablet (81 mg total) by mouth daily. 12/14/13  Yes Alonza Bogus, MD  clonazePAM (KLONOPIN) 1 MG tablet Take 0.5 tablets (0.5 mg total) by mouth 4 (four) times daily as needed for anxiety. 12/14/13  Yes Alonza Bogus, MD  DULoxetine (CYMBALTA) 20 MG capsule Take 1 capsule (20 mg total) by mouth daily. 01/05/13  Yes Radene Gunning, NP  folic acid (FOLVITE) 1 MG tablet Take 1 tablet (1 mg total) by mouth daily. 12/14/13  Yes Alonza Bogus, MD  furosemide (LASIX) 40 MG tablet Take 1 tablet (40 mg total) by mouth daily. 01/05/13  Yes Radene Gunning, NP  HYDROcodone-acetaminophen (NORCO/VICODIN) 5-325 MG per tablet Take 1 tablet by mouth every 4 (four) hours as needed for moderate pain.   Yes Historical Provider, MD  insulin lispro (HUMALOG) 100 UNIT/ML injection Inject 2-10 Units into the skin 3 (three) times daily before meals. SS   Yes Historical Provider, MD  levothyroxine (SYNTHROID, LEVOTHROID) 200 MCG tablet Take 200 mcg by mouth daily before breakfast.   Yes Historical Provider, MD  lisinopril (PRINIVIL,ZESTRIL) 5 MG tablet Take 1 tablet (5 mg total) by mouth daily. 01/05/13  Yes Lezlie Octave Black, NP  PARoxetine (PAXIL) 20 MG tablet Take 20 mg by mouth every morning.   Yes Historical Provider, MD  potassium chloride SA (K-DUR,KLOR-CON) 20 MEQ tablet Take 40 mEq by mouth daily.   Yes Historical Provider, MD  promethazine (PHENERGAN) 25 MG tablet Take 25 mg by mouth every 6 (six) hours as needed for nausea or vomiting.   Yes Historical Provider, MD  simvastatin (ZOCOR) 40 MG tablet Take 40 mg by mouth every morning.    Yes Historical Provider, MD  temazepam (RESTORIL) 15 MG capsule Take 15 mg by mouth at bedtime as needed for sleep.   Yes Historical Provider, MD    Scheduled Meds: . antiseptic oral rinse  15 mL Mouth Rinse BID  . antiseptic oral rinse  15 mL Mouth Rinse BID  . aspirin EC  81 mg Oral Daily  . ciprofloxacin  500 mg Oral BID  . DULoxetine  20 mg Oral Daily  . folic acid  1 mg Oral Daily  . furosemide  20 mg Intravenous Q12H  . hydrocortisone   Rectal BID  . insulin aspart  0-20 Units Subcutaneous TID WC  . ipratropium-albuterol  3 mL Nebulization Q4H  . levothyroxine  200 mcg Oral QAC breakfast  . mupirocin ointment  1 application Nasal BID  . nystatin   Topical BID  . pantoprazole  40 mg Oral BID  AC  . PARoxetine  20 mg Oral Daily  . simvastatin  40 mg Oral q morning - 10a   Continuous Infusions: . sodium chloride Stopped (01/10/14 1500)  . sodium chloride     PRN Meds:.albuterol, benzonatate, clonazePAM, HYDROcodone-acetaminophen, ondansetron (ZOFRAN) IV, ondansetron, sodium chloride   Blood pressure 94/64, pulse 101, temperature 98 F (36.7 C), temperature source Oral, resp. rate 13, height _0  (1.651 m), weight 163.8 kg (361 lb 1.8 oz), SpO2 100.00%.   Results for orders placed during the hospital encounter of 01/06/14 (from the past 48 hour(s))  GLUCOSE, CAPILLARY     Status: Abnormal   Collection Time    01/09/14  4:51 PM      Result Value Ref Range   Glucose-Capillary 104 (*) 70 - 99  mg/dL   Comment 1 Documented in Chart     Comment 2 Notify RN    GLUCOSE, CAPILLARY     Status: Abnormal   Collection Time    01/10/14 12:24 AM      Result Value Ref Range   Glucose-Capillary 109 (*) 70 - 99 mg/dL  CBC     Status: Abnormal   Collection Time    01/10/14  5:55 AM      Result Value Ref Range   WBC 12.8 (*) 4.0 - 10.5 K/uL   RBC 3.11 (*) 3.87 - 5.11 MIL/uL   Hemoglobin 9.3 (*) 12.0 - 15.0 g/dL   HCT 31.5 (*) 36.0 - 46.0 %   MCV 101.3 (*) 78.0 - 100.0 fL   MCH 29.9  26.0 - 34.0 pg   MCHC 29.5 (*) 30.0 - 36.0 g/dL   RDW 23.3 (*) 11.5 - 15.5 %   Platelets 181  150 - 400 K/uL  COMPREHENSIVE METABOLIC PANEL     Status: Abnormal   Collection Time    01/10/14  5:55 AM      Result Value Ref Range   Sodium 144  137 - 147 mEq/L   Potassium 5.4 (*) 3.7 - 5.3 mEq/L   Comment: DELTA CHECK NOTED   Chloride 98  96 - 112 mEq/L   CO2 40 (*) 19 - 32 mEq/L   Comment: CRITICAL RESULT CALLED TO, READ BACK BY AND VERIFIED WITH:     GAVIN,B AT 7:15AM ON 01/10/14 BY FESTERMAN,C   Glucose, Bld 102 (*) 70 - 99 mg/dL   BUN 25 (*) 6 - 23 mg/dL   Creatinine, Ser 1.27 (*) 0.50 - 1.10 mg/dL   Calcium 8.8  8.4 - 10.5 mg/dL   Total Protein 6.5  6.0 - 8.3 g/dL   Albumin 2.6 (*) 3.5 -  5.2 g/dL   AST 31  0 - 37 U/L   ALT 11  0 - 35 U/L   Alkaline Phosphatase 134 (*) 39 - 117 U/L   Total Bilirubin 4.5 (*) 0.3 - 1.2 mg/dL   GFR calc non Af Amer 46 (*) >90 mL/min   GFR calc Af Amer 54 (*) >90 mL/min   Comment: (NOTE)     The eGFR has been calculated using the CKD EPI equation.     This calculation has not been validated in all clinical situations.     eGFR's persistently <90 mL/min signify possible Chronic Kidney     Disease.   Anion gap 6  5 - 15  GLUCOSE, CAPILLARY     Status: Abnormal   Collection Time    01/10/14  7:40 AM      Result Value Ref Range   Glucose-Capillary 103 (*) 70 - 99 mg/dL   Comment 1 Notify RN    BLOOD GAS, ARTERIAL     Status: Abnormal   Collection Time    01/10/14  8:43 AM      Result Value Ref Range   FIO2 100.00     pH, Arterial 7.190 (*) 7.350 - 7.450   Comment: CRITICAL RESULT CALLED TO, READ BACK BY AND VERIFIED WITH:     CINDY MORRISON RN BY ROBIN POWELL RRT AT 0850 ON 01/10/14   pCO2 arterial 101.0 (*) 35.0 - 45.0 mmHg   Comment: CRITICAL RESULT CALLED TO, READ BACK BY AND VERIFIED WITH:     CINDY MORRISON RN BY ROBIN POWELL RRT ON 01/10/14 AT 0850   pO2, Arterial 77.8 (*) 80.0 -  100.0 mmHg   Bicarbonate 37.0 (*) 20.0 - 24.0 mEq/L   TCO2 36.3  0 - 100 mmol/L   Acid-Base Excess 8.9 (*) 0.0 - 2.0 mmol/L   O2 Saturation 94.6     Patient temperature 37.0     Collection site RIGHT RADIAL     Drawn by [id number]     Sample type ARTERIAL     Allens test (pass/fail) PASS  PASS  TROPONIN I     Status: None   Collection Time    01/10/14  8:48 AM      Result Value Ref Range   Troponin I <0.30  <0.30 ng/mL   Comment:            Due to the release kinetics of cTnI,     a negative result within the first hours     of the onset of symptoms does not rule out     myocardial infarction with certainty.     If myocardial infarction is still suspected,     repeat the test at appropriate intervals.  BLOOD GAS, ARTERIAL     Status: Abnormal    Collection Time    01/10/14 11:35 AM      Result Value Ref Range   FIO2 30.00     Delivery systems BILEVEL POSITIVE AIRWAY PRESSURE     Mode BILEVEL POSITIVE AIRWAY PRESSURE     Inspiratory PAP 18     Expiratory PAP 6     pH, Arterial 7.339 (*) 7.350 - 7.450   pCO2 arterial 68.2 (*) 35.0 - 45.0 mmHg   Comment: CRITICAL RESULT CALLED TO, READ BACK BY AND VERIFIED WITH:     JENNIFER COFFEY RN BY ROBIN POWELL RRT ON 01/10/14 AT 1140   pO2, Arterial 52.4 (*) 80.0 - 100.0 mmHg   Bicarbonate 35.8 (*) 20.0 - 24.0 mEq/L   TCO2 33.0  0 - 100 mmol/L   Acid-Base Excess 9.9 (*) 0.0 - 2.0 mmol/L   O2 Saturation 87.8     Patient temperature 37.0     Collection site LEFT RADIAL     Drawn by [id number]     Sample type ARTERIAL     Allens test (pass/fail) PASS  PASS  GLUCOSE, CAPILLARY     Status: None   Collection Time    01/10/14 11:49 AM      Result Value Ref Range   Glucose-Capillary 93  70 - 99 mg/dL  TROPONIN I     Status: None   Collection Time    01/10/14  2:26 PM      Result Value Ref Range   Troponin I <0.30  <0.30 ng/mL   Comment:            Due to the release kinetics of cTnI,     a negative result within the first hours     of the onset of symptoms does not rule out     myocardial infarction with certainty.     If myocardial infarction is still suspected,     repeat the test at appropriate intervals.  GLUCOSE, CAPILLARY     Status: Abnormal   Collection Time    01/10/14  4:40 PM      Result Value Ref Range   Glucose-Capillary 102 (*) 70 - 99 mg/dL  TROPONIN I     Status: None   Collection Time    01/10/14  8:05 PM      Result Value Ref Range   Troponin I <  0.30  <0.30 ng/mL   Comment:            Due to the release kinetics of cTnI,     a negative result within the first hours     of the onset of symptoms does not rule out     myocardial infarction with certainty.     If myocardial infarction is still suspected,     repeat the test at appropriate intervals.  AMMONIA      Status: None   Collection Time    01/10/14  8:05 PM      Result Value Ref Range   Ammonia 18  11 - 60 umol/L  GLUCOSE, CAPILLARY     Status: None   Collection Time    01/10/14 10:51 PM      Result Value Ref Range   Glucose-Capillary 98  70 - 99 mg/dL   Comment 1 Documented in Chart     Comment 2 Notify RN    CBC     Status: Abnormal   Collection Time    01/11/14  4:14 AM      Result Value Ref Range   WBC 8.4  4.0 - 10.5 K/uL   RBC 2.74 (*) 3.87 - 5.11 MIL/uL   Hemoglobin 8.3 (*) 12.0 - 15.0 g/dL   HCT 27.5 (*) 36.0 - 46.0 %   MCV 100.4 (*) 78.0 - 100.0 fL   MCH 30.3  26.0 - 34.0 pg   MCHC 30.2  30.0 - 36.0 g/dL   RDW 23.4 (*) 11.5 - 15.5 %   Platelets 137 (*) 150 - 400 K/uL   Comment: DELTA CHECK NOTED  COMPREHENSIVE METABOLIC PANEL     Status: Abnormal   Collection Time    01/11/14  4:14 AM      Result Value Ref Range   Sodium 145  137 - 147 mEq/L   Potassium 4.5  3.7 - 5.3 mEq/L   Chloride 99  96 - 112 mEq/L   CO2 40 (*) 19 - 32 mEq/L   Comment: CRITICAL RESULT CALLED TO, READ BACK BY AND VERIFIED WITH:     MCDANIEL,M AT 5:55AM ON 01/11/14 BY FESTERMAN,C   Glucose, Bld 99  70 - 99 mg/dL   BUN 32 (*) 6 - 23 mg/dL   Creatinine, Ser 1.47 (*) 0.50 - 1.10 mg/dL   Calcium 9.0  8.4 - 10.5 mg/dL   Total Protein 6.1  6.0 - 8.3 g/dL   Albumin 2.4 (*) 3.5 - 5.2 g/dL   AST 29  0 - 37 U/L   ALT 11  0 - 35 U/L   Alkaline Phosphatase 117  39 - 117 U/L   Total Bilirubin 3.1 (*) 0.3 - 1.2 mg/dL   GFR calc non Af Amer 39 (*) >90 mL/min   GFR calc Af Amer 45 (*) >90 mL/min   Comment: (NOTE)     The eGFR has been calculated using the CKD EPI equation.     This calculation has not been validated in all clinical situations.     eGFR's persistently <90 mL/min signify possible Chronic Kidney     Disease.   Anion gap 6  5 - 15  BLOOD GAS, ARTERIAL     Status: Abnormal   Collection Time    01/11/14  5:00 AM      Result Value Ref Range   FIO2 40.00     Delivery systems BILEVEL  POSITIVE AIRWAY PRESSURE     Inspiratory PAP 18.0  Expiratory PAP 6.0     pH, Arterial 7.371  7.350 - 7.450   pCO2 arterial 67.4 (*) 35.0 - 45.0 mmHg   Comment: CRITICAL RESULT CALLED TO, READ BACK BY AND VERIFIED WITH:     MCDANIEL, M. RN AT 1751 01/11/14 ANDERSON, S.RRT   pO2, Arterial 99.4  80.0 - 100.0 mmHg   Bicarbonate 38.1 (*) 20.0 - 24.0 mEq/L   TCO2 36.6  0 - 100 mmol/L   Acid-Base Excess 12.4 (*) 0.0 - 2.0 mmol/L   O2 Saturation 98.2     Patient temperature 37.0     Collection site RIGHT RADIAL     Drawn by [id number]     Sample type ARTERIAL     Allens test (pass/fail) PASS  PASS  GLUCOSE, CAPILLARY     Status: None   Collection Time    01/11/14  7:33 AM      Result Value Ref Range   Glucose-Capillary 90  70 - 99 mg/dL  GLUCOSE, CAPILLARY     Status: None   Collection Time    01/11/14 11:18 AM      Result Value Ref Range   Glucose-Capillary 94  70 - 99 mg/dL    Ct Head Wo Contrast  01/10/2014   CLINICAL DATA:  Mental status changes  EXAM: CT HEAD WITHOUT CONTRAST  TECHNIQUE: Contiguous axial images were obtained from the base of the skull through the vertex without intravenous contrast.  COMPARISON:  None.  FINDINGS: No skull fracture is noted. Paranasal sinuses and mastoid air cells are unremarkable. There are motion artifacts.  No intracranial hemorrhage, mass effect or midline shift. No definite acute cortical infarction. No mass lesion is noted on this unenhanced scan. No hydrocephalus.  IMPRESSION: No acute intracranial abnormality. Motion artifacts are noted. No definite acute cortical infarction.   Electronically Signed   By: Lahoma Crocker M.D.   On: 01/10/2014 15:43   Dg Chest Port 1 View  01/11/2014   CLINICAL DATA:  Ventilated patient; history of COPD and CHF.  EXAM: PORTABLE CHEST - 1 VIEW  COMPARISON:  Portable chest x-ray of January 10, 2014  FINDINGS: The lungs are reasonably well inflated. Confluent alveolar infiltrates persist bilaterally but have slightly improved.  The left hemidiaphragm remain obscured. No endotracheal tube is demonstrated. The cardiac silhouette remains enlarged and the pulmonary vascularity remains engorged.  IMPRESSION: CHF with moderate to severe pulmonary interstitial and alveolar edema. The lungs have slightly improved since yesterday's study.   Electronically Signed   By: David  Martinique   On: 01/11/2014 08:08   Dg Chest Port 1 View  01/10/2014   CLINICAL DATA:  Acute respiratory failure.  EXAM: PORTABLE CHEST - 1 VIEW  COMPARISON:  01/09/2014.  FINDINGS: Patient is rotated to the right making evaluation mediastinum difficult today's exam . Severe cardiomegaly with pulmonary vascular prominence and bilateral alveolar infiltrates consistent with congestive heart failure with pulmonary edema. Bilateral effusions are present. Similar findings on prior exam. No pneumothorax is No acute bony abnormality identified.  IMPRESSION: Congestive heart failure with bilateral pulmonary edema and pleural effusions. Similar findings noted on prior study.   Electronically Signed   By: Marcello Moores  Register   On: 01/10/2014 09:24        [place] A. Merlene Laughter, M.D.  Diplomate, [employer] adviser of Psychiatry and Neurology ( Neurology). 01/11/2014, 1:31 PM

## 2014-01-11 NOTE — Progress Notes (Signed)
Subjective: Patient was transferred to ICU on BIPAP yesterday due to acute respiratory failure with CO2 retention. She is being followed by Dr. Luan Pulling for the respiratory failure. Patient more alerta nd awake. Her CO2 has improved.  Objective: Vital signs in last 24 hours: Temp:  [96.3 F (35.7 C)-98.1 F (36.7 C)] 98 F (36.7 C) (07/30 0429) Pulse Rate:  [88-102] 100 (07/30 0711) Resp:  [10-32] 19 (07/30 0711) BP: (76-108)/(24-60) 95/48 mmHg (07/30 0630) SpO2:  [85 %-100 %] 99 % (07/30 0711) FiO2 (%):  [30 %-40 %] 35 % (07/30 0710) Weight change:  Last BM Date: 01/06/14  Intake/Output from previous day: 07/29 0701 - 07/30 0700 In: 820 [I.V.:820] Out: 1200 [Urine:1200]  PHYSICAL EXAM General appearance: alert and no distress Resp: diminished breath sounds bilaterally and rhonchi bilaterally Cardio: S1, S2 normal GI: soft, non-tender; bowel sounds normal; no masses,  no organomegaly Extremities: edema 2++  Lab Results:  Results for orders placed during the hospital encounter of 01/06/14 (from the past 48 hour(s))  GLUCOSE, CAPILLARY     Status: Abnormal   Collection Time    01/09/14  8:20 AM      Result Value Ref Range   Glucose-Capillary 104 (*) 70 - 99 mg/dL   Comment 1 Documented in Chart     Comment 2 Notify RN    CBC     Status: Abnormal   Collection Time    01/09/14  8:30 AM      Result Value Ref Range   WBC 8.9  4.0 - 10.5 K/uL   RBC 3.23 (*) 3.87 - 5.11 MIL/uL   Hemoglobin 9.7 (*) 12.0 - 15.0 g/dL   Comment: DELTA CHECK NOTED   HCT 31.7 (*) 36.0 - 46.0 %   MCV 98.1  78.0 - 100.0 fL   MCH 30.0  26.0 - 34.0 pg   MCHC 30.6  30.0 - 36.0 g/dL   RDW 24.0 (*) 11.5 - 15.5 %   Platelets 160  150 - 400 K/uL  BASIC METABOLIC PANEL     Status: Abnormal   Collection Time    01/09/14  8:30 AM      Result Value Ref Range   Sodium 143  137 - 147 mEq/L   Potassium 4.3  3.7 - 5.3 mEq/L   Chloride 98  96 - 112 mEq/L   CO2 39 (*) 19 - 32 mEq/L   Glucose, Bld 113 (*) 70  - 99 mg/dL   BUN 21  6 - 23 mg/dL   Creatinine, Ser 1.07  0.50 - 1.10 mg/dL   Calcium 8.9  8.4 - 10.5 mg/dL   GFR calc non Af Amer 57 (*) >90 mL/min   GFR calc Af Amer 66 (*) >90 mL/min   Comment: (NOTE)     The eGFR has been calculated using the CKD EPI equation.     This calculation has not been validated in all clinical situations.     eGFR's persistently <90 mL/min signify possible Chronic Kidney     Disease.   Anion gap 6  5 - 15  GLUCOSE, CAPILLARY     Status: Abnormal   Collection Time    01/09/14 11:35 AM      Result Value Ref Range   Glucose-Capillary 115 (*) 70 - 99 mg/dL   Comment 1 Documented in Chart     Comment 2 Notify RN    GLUCOSE, CAPILLARY     Status: Abnormal   Collection Time    01/09/14  4:51 PM      Result Value Ref Range   Glucose-Capillary 104 (*) 70 - 99 mg/dL   Comment 1 Documented in Chart     Comment 2 Notify RN    GLUCOSE, CAPILLARY     Status: Abnormal   Collection Time    01/10/14 12:24 AM      Result Value Ref Range   Glucose-Capillary 109 (*) 70 - 99 mg/dL  CBC     Status: Abnormal   Collection Time    01/10/14  5:55 AM      Result Value Ref Range   WBC 12.8 (*) 4.0 - 10.5 K/uL   RBC 3.11 (*) 3.87 - 5.11 MIL/uL   Hemoglobin 9.3 (*) 12.0 - 15.0 g/dL   HCT 31.5 (*) 36.0 - 46.0 %   MCV 101.3 (*) 78.0 - 100.0 fL   MCH 29.9  26.0 - 34.0 pg   MCHC 29.5 (*) 30.0 - 36.0 g/dL   RDW 23.3 (*) 11.5 - 15.5 %   Platelets 181  150 - 400 K/uL  COMPREHENSIVE METABOLIC PANEL     Status: Abnormal   Collection Time    01/10/14  5:55 AM      Result Value Ref Range   Sodium 144  137 - 147 mEq/L   Potassium 5.4 (*) 3.7 - 5.3 mEq/L   Comment: DELTA CHECK NOTED   Chloride 98  96 - 112 mEq/L   CO2 40 (*) 19 - 32 mEq/L   Comment: CRITICAL RESULT CALLED TO, READ BACK BY AND VERIFIED WITH:     GAVIN,B AT 7:15AM ON 01/10/14 BY FESTERMAN,C   Glucose, Bld 102 (*) 70 - 99 mg/dL   BUN 25 (*) 6 - 23 mg/dL   Creatinine, Ser 1.27 (*) 0.50 - 1.10 mg/dL   Calcium  8.8  8.4 - 10.5 mg/dL   Total Protein 6.5  6.0 - 8.3 g/dL   Albumin 2.6 (*) 3.5 - 5.2 g/dL   AST 31  0 - 37 U/L   ALT 11  0 - 35 U/L   Alkaline Phosphatase 134 (*) 39 - 117 U/L   Total Bilirubin 4.5 (*) 0.3 - 1.2 mg/dL   GFR calc non Af Amer 46 (*) >90 mL/min   GFR calc Af Amer 54 (*) >90 mL/min   Comment: (NOTE)     The eGFR has been calculated using the CKD EPI equation.     This calculation has not been validated in all clinical situations.     eGFR's persistently <90 mL/min signify possible Chronic Kidney     Disease.   Anion gap 6  5 - 15  GLUCOSE, CAPILLARY     Status: Abnormal   Collection Time    01/10/14  7:40 AM      Result Value Ref Range   Glucose-Capillary 103 (*) 70 - 99 mg/dL   Comment 1 Notify RN    BLOOD GAS, ARTERIAL     Status: Abnormal   Collection Time    01/10/14  8:43 AM      Result Value Ref Range   FIO2 100.00     pH, Arterial 7.190 (*) 7.350 - 7.450   Comment: CRITICAL RESULT CALLED TO, READ BACK BY AND VERIFIED WITH:     CINDY MORRISON RN BY ROBIN POWELL RRT AT 0850 ON 01/10/14   pCO2 arterial 101.0 (*) 35.0 - 45.0 mmHg   Comment: CRITICAL RESULT CALLED TO, READ BACK BY AND VERIFIED WITH:  CINDY MORRISON RN BY ROBIN POWELL RRT ON 01/10/14 AT 0850   pO2, Arterial 77.8 (*) 80.0 - 100.0 mmHg   Bicarbonate 37.0 (*) 20.0 - 24.0 mEq/L   TCO2 36.3  0 - 100 mmol/L   Acid-Base Excess 8.9 (*) 0.0 - 2.0 mmol/L   O2 Saturation 94.6     Patient temperature 37.0     Collection site RIGHT RADIAL     Drawn by [id number]     Sample type ARTERIAL     Allens test (pass/fail) PASS  PASS  TROPONIN I     Status: None   Collection Time    01/10/14  8:48 AM      Result Value Ref Range   Troponin I <0.30  <0.30 ng/mL   Comment:            Due to the release kinetics of cTnI,     a negative result within the first hours     of the onset of symptoms does not rule out     myocardial infarction with certainty.     If myocardial infarction is still suspected,     repeat  the test at appropriate intervals.  BLOOD GAS, ARTERIAL     Status: Abnormal   Collection Time    01/10/14 11:35 AM      Result Value Ref Range   FIO2 30.00     Delivery systems BILEVEL POSITIVE AIRWAY PRESSURE     Mode BILEVEL POSITIVE AIRWAY PRESSURE     Inspiratory PAP 18     Expiratory PAP 6     pH, Arterial 7.339 (*) 7.350 - 7.450   pCO2 arterial 68.2 (*) 35.0 - 45.0 mmHg   Comment: CRITICAL RESULT CALLED TO, READ BACK BY AND VERIFIED WITH:     JENNIFER COFFEY RN BY ROBIN POWELL RRT ON 01/10/14 AT 1140   pO2, Arterial 52.4 (*) 80.0 - 100.0 mmHg   Bicarbonate 35.8 (*) 20.0 - 24.0 mEq/L   TCO2 33.0  0 - 100 mmol/L   Acid-Base Excess 9.9 (*) 0.0 - 2.0 mmol/L   O2 Saturation 87.8     Patient temperature 37.0     Collection site LEFT RADIAL     Drawn by [id number]     Sample type ARTERIAL     Allens test (pass/fail) PASS  PASS  GLUCOSE, CAPILLARY     Status: None   Collection Time    01/10/14 11:49 AM      Result Value Ref Range   Glucose-Capillary 93  70 - 99 mg/dL  TROPONIN I     Status: None   Collection Time    01/10/14  2:26 PM      Result Value Ref Range   Troponin I <0.30  <0.30 ng/mL   Comment:            Due to the release kinetics of cTnI,     a negative result within the first hours     of the onset of symptoms does not rule out     myocardial infarction with certainty.     If myocardial infarction is still suspected,     repeat the test at appropriate intervals.  GLUCOSE, CAPILLARY     Status: Abnormal   Collection Time    01/10/14  4:40 PM      Result Value Ref Range   Glucose-Capillary 102 (*) 70 - 99 mg/dL  TROPONIN I     Status: None   Collection Time  01/10/14  8:05 PM      Result Value Ref Range   Troponin I <0.30  <0.30 ng/mL   Comment:            Due to the release kinetics of cTnI,     a negative result within the first hours     of the onset of symptoms does not rule out     myocardial infarction with certainty.     If myocardial infarction is  still suspected,     repeat the test at appropriate intervals.  AMMONIA     Status: None   Collection Time    01/10/14  8:05 PM      Result Value Ref Range   Ammonia 18  11 - 60 umol/L  GLUCOSE, CAPILLARY     Status: None   Collection Time    01/10/14 10:51 PM      Result Value Ref Range   Glucose-Capillary 98  70 - 99 mg/dL   Comment 1 Documented in Chart     Comment 2 Notify RN    CBC     Status: Abnormal   Collection Time    01/11/14  4:14 AM      Result Value Ref Range   WBC 8.4  4.0 - 10.5 K/uL   RBC 2.74 (*) 3.87 - 5.11 MIL/uL   Hemoglobin 8.3 (*) 12.0 - 15.0 g/dL   HCT 27.5 (*) 36.0 - 46.0 %   MCV 100.4 (*) 78.0 - 100.0 fL   MCH 30.3  26.0 - 34.0 pg   MCHC 30.2  30.0 - 36.0 g/dL   RDW 23.4 (*) 11.5 - 15.5 %   Platelets 137 (*) 150 - 400 K/uL   Comment: DELTA CHECK NOTED  COMPREHENSIVE METABOLIC PANEL     Status: Abnormal   Collection Time    01/11/14  4:14 AM      Result Value Ref Range   Sodium 145  137 - 147 mEq/L   Potassium 4.5  3.7 - 5.3 mEq/L   Chloride 99  96 - 112 mEq/L   CO2 40 (*) 19 - 32 mEq/L   Comment: CRITICAL RESULT CALLED TO, READ BACK BY AND VERIFIED WITH:     MCDANIEL,M AT 5:55AM ON 01/11/14 BY FESTERMAN,C   Glucose, Bld 99  70 - 99 mg/dL   BUN 32 (*) 6 - 23 mg/dL   Creatinine, Ser 1.47 (*) 0.50 - 1.10 mg/dL   Calcium 9.0  8.4 - 10.5 mg/dL   Total Protein 6.1  6.0 - 8.3 g/dL   Albumin 2.4 (*) 3.5 - 5.2 g/dL   AST 29  0 - 37 U/L   ALT 11  0 - 35 U/L   Alkaline Phosphatase 117  39 - 117 U/L   Total Bilirubin 3.1 (*) 0.3 - 1.2 mg/dL   GFR calc non Af Amer 39 (*) >90 mL/min   GFR calc Af Amer 45 (*) >90 mL/min   Comment: (NOTE)     The eGFR has been calculated using the CKD EPI equation.     This calculation has not been validated in all clinical situations.     eGFR's persistently <90 mL/min signify possible Chronic Kidney     Disease.   Anion gap 6  5 - 15  BLOOD GAS, ARTERIAL     Status: Abnormal   Collection Time    01/11/14  5:00 AM       Result Value Ref Range   FIO2 40.00  Delivery systems BILEVEL POSITIVE AIRWAY PRESSURE     Inspiratory PAP 18.0     Expiratory PAP 6.0     pH, Arterial 7.371  7.350 - 7.450   pCO2 arterial 67.4 (*) 35.0 - 45.0 mmHg   Comment: CRITICAL RESULT CALLED TO, READ BACK BY AND VERIFIED WITH:     MCDANIEL, M. RN AT 2500 01/11/14 ANDERSON, S.RRT   pO2, Arterial 99.4  80.0 - 100.0 mmHg   Bicarbonate 38.1 (*) 20.0 - 24.0 mEq/L   TCO2 36.6  0 - 100 mmol/L   Acid-Base Excess 12.4 (*) 0.0 - 2.0 mmol/L   O2 Saturation 98.2     Patient temperature 37.0     Collection site RIGHT RADIAL     Drawn by [id number]     Sample type ARTERIAL     Allens test (pass/fail) PASS  PASS    ABGS  Recent Labs  01/11/14 0500  PHART 7.371  PO2ART 99.4  TCO2 36.6  HCO3 38.1*   CULTURES Recent Results (from the past 240 hour(s))  URINE CULTURE     Status: None   Collection Time    01/06/14  2:02 PM      Result Value Ref Range Status   Specimen Description URINE, CATHETERIZED   Final   Special Requests NONE   Final   Culture  Setup Time     Final   Value: 01/06/2014 21:21     Performed at [facility]     Final   Value: >=100,000 COLONIES/ML     Performed at [facility]   Culture     Final   Value: KLEBSIELLA PNEUMONIAE     Performed at [facility]   Report Status 01/08/2014 FINAL   Final   Organism ID, Bacteria KLEBSIELLA PNEUMONIAE   Final  MRSA PCR SCREENING     Status: Abnormal   Collection Time    01/06/14  6:25 PM      Result Value Ref Range Status   MRSA by PCR POSITIVE (*) NEGATIVE Final   Comment:            The GeneXpert MRSA Assay (FDA     approved for NASAL specimens     only), is one component of a     comprehensive MRSA colonization     surveillance program. It is not     intended to diagnose MRSA     infection nor to guide or     monitor treatment for     MRSA infections.     CRITICAL RESULT CALLED TO, READ BACK BY AND VERIFIED WITH:      M.GRAY AT 2110 ON 01/06/14 BY S.VANHOORNE   Studies/Results: Dg Chest 1 View  01/09/2014   CLINICAL DATA:  Followup of congestive heart failure.  EXAM: CHEST - 1 VIEW  COMPARISON:  01/06/2014 and 12/06/2013  FINDINGS: Stable cardiomegaly. Diffuse pulmonary vascular congestion. There are bilateral interstitial and airspace opacities, most prominent perihilar regions and there saline hazy opacities bilaterally suggestive of bilateral posteriorly layering pleural effusions. Overall, aeration of the lungs appears slightly worse on today's chest radiograph compared to 01/06/2014.  Low right peritracheal soft tissue density measures up to 3 cm transverse dimension and could reflect distended azygos/ SVC in the setting of pulmonary venous hypertension, but lymphadenopathy or mass cannot be excluded.  IMPRESSION: Moderate to severe congestive heart failure pattern. Bilateral layering pleural effusions.  Soft tissue prominence in the lower right paratracheal region  could be related to pulmonary venous congestion, but lymphadenopathy or mass cannot be completely excluded. Recommend attention on short-term follow-up PA and lateral chest radiograph with the patient's congestive heart failure improves.  These results will be called to the ordering clinician or representative by the Radiologist Assistant, and communication documented in the PACS or zVision Dashboard.   Electronically Signed   By: Curlene Dolphin M.D.   On: 01/09/2014 09:20   Ct Head Wo Contrast  01/10/2014   CLINICAL DATA:  Mental status changes  EXAM: CT HEAD WITHOUT CONTRAST  TECHNIQUE: Contiguous axial images were obtained from the base of the skull through the vertex without intravenous contrast.  COMPARISON:  None.  FINDINGS: No skull fracture is noted. Paranasal sinuses and mastoid air cells are unremarkable. There are motion artifacts.  No intracranial hemorrhage, mass effect or midline shift. No definite acute cortical infarction. No mass lesion  is noted on this unenhanced scan. No hydrocephalus.  IMPRESSION: No acute intracranial abnormality. Motion artifacts are noted. No definite acute cortical infarction.   Electronically Signed   By: Lahoma Crocker M.D.   On: 01/10/2014 15:43   Dg Chest Port 1 View  01/10/2014   CLINICAL DATA:  Acute respiratory failure.  EXAM: PORTABLE CHEST - 1 VIEW  COMPARISON:  01/09/2014.  FINDINGS: Patient is rotated to the right making evaluation mediastinum difficult today's exam . Severe cardiomegaly with pulmonary vascular prominence and bilateral alveolar infiltrates consistent with congestive heart failure with pulmonary edema. Bilateral effusions are present. Similar findings on prior exam. No pneumothorax is No acute bony abnormality identified.  IMPRESSION: Congestive heart failure with bilateral pulmonary edema and pleural effusions. Similar findings noted on prior study.   Electronically Signed   By: Marcello Moores  Register   On: 01/10/2014 09:24    Medications: I have reviewed the patient's current medications.  Assesment: Active Problems:   Anemia Pulmonary edema  UTI  Diabetes mellitus  History of hypothyroidism  COPD    Plan: Medications reviewed Pulmonary consult appreciated Will continue BIPAP as per pulmonary recommendation Will monitor CBC/BMP and and ABG    LOS: 5 days   Dorethy Tomey 01/11/2014, 7:55 AM

## 2014-01-11 NOTE — Progress Notes (Signed)
Subjective: She is still poorly responsive to my exam. She remains on BiPAP. Her ammonia level was normal.  Objective: Vital signs in last 24 hours: Temp:  [96.3 F (35.7 C)-98.1 F (36.7 C)] 98 F (36.7 C) (07/30 0429) Pulse Rate:  [88-102] 100 (07/30 0711) Resp:  [10-32] 19 (07/30 0711) BP: (76-108)/(24-60) 95/48 mmHg (07/30 0630) SpO2:  [85 %-100 %] 99 % (07/30 0711) FiO2 (%):  [30 %-40 %] 35 % (07/30 0710) Weight change:  Last BM Date: 01/06/14  Intake/Output from previous day: 07/29 0701 - 07/30 0700 In: 820 [I.V.:820] Out: 1200 [Urine:1200]  PHYSICAL EXAM General appearance: She is still difficult to arouse to my exam. I think I saw her earlier than Dr. Legrand Rams Resp: clear to auscultation bilaterally Cardio: regular rate and rhythm, S1, S2 normal, no murmur, click, rub or gallop GI: soft, non-tender; bowel sounds normal; no masses,  no organomegaly Extremities: 2+  Lab Results:  Results for orders placed during the hospital encounter of 01/06/14 (from the past 48 hour(s))  GLUCOSE, CAPILLARY     Status: Abnormal   Collection Time    01/09/14 11:35 AM      Result Value Ref Range   Glucose-Capillary 115 (*) 70 - 99 mg/dL   Comment 1 Documented in Chart     Comment 2 Notify RN    GLUCOSE, CAPILLARY     Status: Abnormal   Collection Time    01/09/14  4:51 PM      Result Value Ref Range   Glucose-Capillary 104 (*) 70 - 99 mg/dL   Comment 1 Documented in Chart     Comment 2 Notify RN    GLUCOSE, CAPILLARY     Status: Abnormal   Collection Time    01/10/14 12:24 AM      Result Value Ref Range   Glucose-Capillary 109 (*) 70 - 99 mg/dL  CBC     Status: Abnormal   Collection Time    01/10/14  5:55 AM      Result Value Ref Range   WBC 12.8 (*) 4.0 - 10.5 K/uL   RBC 3.11 (*) 3.87 - 5.11 MIL/uL   Hemoglobin 9.3 (*) 12.0 - 15.0 g/dL   HCT 31.5 (*) 36.0 - 46.0 %   MCV 101.3 (*) 78.0 - 100.0 fL   MCH 29.9  26.0 - 34.0 pg   MCHC 29.5 (*) 30.0 - 36.0 g/dL   RDW 23.3  (*) 11.5 - 15.5 %   Platelets 181  150 - 400 K/uL  COMPREHENSIVE METABOLIC PANEL     Status: Abnormal   Collection Time    01/10/14  5:55 AM      Result Value Ref Range   Sodium 144  137 - 147 mEq/L   Potassium 5.4 (*) 3.7 - 5.3 mEq/L   Comment: DELTA CHECK NOTED   Chloride 98  96 - 112 mEq/L   CO2 40 (*) 19 - 32 mEq/L   Comment: CRITICAL RESULT CALLED TO, READ BACK BY AND VERIFIED WITH:     GAVIN,B AT 7:15AM ON 01/10/14 BY FESTERMAN,C   Glucose, Bld 102 (*) 70 - 99 mg/dL   BUN 25 (*) 6 - 23 mg/dL   Creatinine, Ser 1.27 (*) 0.50 - 1.10 mg/dL   Calcium 8.8  8.4 - 10.5 mg/dL   Total Protein 6.5  6.0 - 8.3 g/dL   Albumin 2.6 (*) 3.5 - 5.2 g/dL   AST 31  0 - 37 U/L   ALT 11  0 -  35 U/L   Alkaline Phosphatase 134 (*) 39 - 117 U/L   Total Bilirubin 4.5 (*) 0.3 - 1.2 mg/dL   GFR calc non Af Amer 46 (*) >90 mL/min   GFR calc Af Amer 54 (*) >90 mL/min   Comment: (NOTE)     The eGFR has been calculated using the CKD EPI equation.     This calculation has not been validated in all clinical situations.     eGFR's persistently <90 mL/min signify possible Chronic Kidney     Disease.   Anion gap 6  5 - 15  GLUCOSE, CAPILLARY     Status: Abnormal   Collection Time    01/10/14  7:40 AM      Result Value Ref Range   Glucose-Capillary 103 (*) 70 - 99 mg/dL   Comment 1 Notify RN    BLOOD GAS, ARTERIAL     Status: Abnormal   Collection Time    01/10/14  8:43 AM      Result Value Ref Range   FIO2 100.00     pH, Arterial 7.190 (*) 7.350 - 7.450   Comment: CRITICAL RESULT CALLED TO, READ BACK BY AND VERIFIED WITH:     CINDY MORRISON RN BY ROBIN POWELL RRT AT 0850 ON 01/10/14   pCO2 arterial 101.0 (*) 35.0 - 45.0 mmHg   Comment: CRITICAL RESULT CALLED TO, READ BACK BY AND VERIFIED WITH:     CINDY MORRISON RN BY ROBIN POWELL RRT ON 01/10/14 AT 0850   pO2, Arterial 77.8 (*) 80.0 - 100.0 mmHg   Bicarbonate 37.0 (*) 20.0 - 24.0 mEq/L   TCO2 36.3  0 - 100 mmol/L   Acid-Base Excess 8.9 (*) 0.0 -  2.0 mmol/L   O2 Saturation 94.6     Patient temperature 37.0     Collection site RIGHT RADIAL     Drawn by [id number]     Sample type ARTERIAL     Allens test (pass/fail) PASS  PASS  TROPONIN I     Status: None   Collection Time    01/10/14  8:48 AM      Result Value Ref Range   Troponin I <0.30  <0.30 ng/mL   Comment:            Due to the release kinetics of cTnI,     a negative result within the first hours     of the onset of symptoms does not rule out     myocardial infarction with certainty.     If myocardial infarction is still suspected,     repeat the test at appropriate intervals.  BLOOD GAS, ARTERIAL     Status: Abnormal   Collection Time    01/10/14 11:35 AM      Result Value Ref Range   FIO2 30.00     Delivery systems BILEVEL POSITIVE AIRWAY PRESSURE     Mode BILEVEL POSITIVE AIRWAY PRESSURE     Inspiratory PAP 18     Expiratory PAP 6     pH, Arterial 7.339 (*) 7.350 - 7.450   pCO2 arterial 68.2 (*) 35.0 - 45.0 mmHg   Comment: CRITICAL RESULT CALLED TO, READ BACK BY AND VERIFIED WITH:     JENNIFER COFFEY RN BY ROBIN POWELL RRT ON 01/10/14 AT 1140   pO2, Arterial 52.4 (*) 80.0 - 100.0 mmHg   Bicarbonate 35.8 (*) 20.0 - 24.0 mEq/L   TCO2 33.0  0 - 100 mmol/L   Acid-Base Excess 9.9 (*)  0.0 - 2.0 mmol/L   O2 Saturation 87.8     Patient temperature 37.0     Collection site LEFT RADIAL     Drawn by [id number]     Sample type ARTERIAL     Allens test (pass/fail) PASS  PASS  GLUCOSE, CAPILLARY     Status: None   Collection Time    01/10/14 11:49 AM      Result Value Ref Range   Glucose-Capillary 93  70 - 99 mg/dL  TROPONIN I     Status: None   Collection Time    01/10/14  2:26 PM      Result Value Ref Range   Troponin I <0.30  <0.30 ng/mL   Comment:            Due to the release kinetics of cTnI,     a negative result within the first hours     of the onset of symptoms does not rule out     myocardial infarction with certainty.     If myocardial infarction is still  suspected,     repeat the test at appropriate intervals.  GLUCOSE, CAPILLARY     Status: Abnormal   Collection Time    01/10/14  4:40 PM      Result Value Ref Range   Glucose-Capillary 102 (*) 70 - 99 mg/dL  TROPONIN I     Status: None   Collection Time    01/10/14  8:05 PM      Result Value Ref Range   Troponin I <0.30  <0.30 ng/mL   Comment:            Due to the release kinetics of cTnI,     a negative result within the first hours     of the onset of symptoms does not rule out     myocardial infarction with certainty.     If myocardial infarction is still suspected,     repeat the test at appropriate intervals.  AMMONIA     Status: None   Collection Time    01/10/14  8:05 PM      Result Value Ref Range   Ammonia 18  11 - 60 umol/L  GLUCOSE, CAPILLARY     Status: None   Collection Time    01/10/14 10:51 PM      Result Value Ref Range   Glucose-Capillary 98  70 - 99 mg/dL   Comment 1 Documented in Chart     Comment 2 Notify RN    CBC     Status: Abnormal   Collection Time    01/11/14  4:14 AM      Result Value Ref Range   WBC 8.4  4.0 - 10.5 K/uL   RBC 2.74 (*) 3.87 - 5.11 MIL/uL   Hemoglobin 8.3 (*) 12.0 - 15.0 g/dL   HCT 27.5 (*) 36.0 - 46.0 %   MCV 100.4 (*) 78.0 - 100.0 fL   MCH 30.3  26.0 - 34.0 pg   MCHC 30.2  30.0 - 36.0 g/dL   RDW 23.4 (*) 11.5 - 15.5 %   Platelets 137 (*) 150 - 400 K/uL   Comment: DELTA CHECK NOTED  COMPREHENSIVE METABOLIC PANEL     Status: Abnormal   Collection Time    01/11/14  4:14 AM      Result Value Ref Range   Sodium 145  137 - 147 mEq/L   Potassium 4.5  3.7 - 5.3 mEq/L  Chloride 99  96 - 112 mEq/L   CO2 40 (*) 19 - 32 mEq/L   Comment: CRITICAL RESULT CALLED TO, READ BACK BY AND VERIFIED WITH:     MCDANIEL,M AT 5:55AM ON 01/11/14 BY FESTERMAN,C   Glucose, Bld 99  70 - 99 mg/dL   BUN 32 (*) 6 - 23 mg/dL   Creatinine, Ser 1.47 (*) 0.50 - 1.10 mg/dL   Calcium 9.0  8.4 - 10.5 mg/dL   Total Protein 6.1  6.0 - 8.3 g/dL    Albumin 2.4 (*) 3.5 - 5.2 g/dL   AST 29  0 - 37 U/L   ALT 11  0 - 35 U/L   Alkaline Phosphatase 117  39 - 117 U/L   Total Bilirubin 3.1 (*) 0.3 - 1.2 mg/dL   GFR calc non Af Amer 39 (*) >90 mL/min   GFR calc Af Amer 45 (*) >90 mL/min   Comment: (NOTE)     The eGFR has been calculated using the CKD EPI equation.     This calculation has not been validated in all clinical situations.     eGFR's persistently <90 mL/min signify possible Chronic Kidney     Disease.   Anion gap 6  5 - 15  BLOOD GAS, ARTERIAL     Status: Abnormal   Collection Time    01/11/14  5:00 AM      Result Value Ref Range   FIO2 40.00     Delivery systems BILEVEL POSITIVE AIRWAY PRESSURE     Inspiratory PAP 18.0     Expiratory PAP 6.0     pH, Arterial 7.371  7.350 - 7.450   pCO2 arterial 67.4 (*) 35.0 - 45.0 mmHg   Comment: CRITICAL RESULT CALLED TO, READ BACK BY AND VERIFIED WITH:     MCDANIEL, M. RN AT 5974 01/11/14 ANDERSON, S.RRT   pO2, Arterial 99.4  80.0 - 100.0 mmHg   Bicarbonate 38.1 (*) 20.0 - 24.0 mEq/L   TCO2 36.6  0 - 100 mmol/L   Acid-Base Excess 12.4 (*) 0.0 - 2.0 mmol/L   O2 Saturation 98.2     Patient temperature 37.0     Collection site RIGHT RADIAL     Drawn by [id number]     Sample type ARTERIAL     Allens test (pass/fail) PASS  PASS  GLUCOSE, CAPILLARY     Status: None   Collection Time    01/11/14  7:33 AM      Result Value Ref Range   Glucose-Capillary 90  70 - 99 mg/dL    ABGS  Recent Labs  01/11/14 0500  PHART 7.371  PO2ART 99.4  TCO2 36.6  HCO3 38.1*   CULTURES Recent Results (from the past 240 hour(s))  URINE CULTURE     Status: None   Collection Time    01/06/14  2:02 PM      Result Value Ref Range Status   Specimen Description URINE, CATHETERIZED   Final   Special Requests NONE   Final   Culture  Setup Time     Final   Value: 01/06/2014 21:21     Performed at [place]     Final   Value: >=100,000 COLONIES/ML     Performed at [facility]   Culture     Final   Value: KLEBSIELLA PNEUMONIAE     Performed at [facility]   Report Status 01/08/2014 FINAL   Final  Organism ID, Bacteria KLEBSIELLA PNEUMONIAE   Final  MRSA PCR SCREENING     Status: Abnormal   Collection Time    01/06/14  6:25 PM      Result Value Ref Range Status   MRSA by PCR POSITIVE (*) NEGATIVE Final   Comment:            The GeneXpert MRSA Assay (FDA     approved for NASAL specimens     only), is one component of a     comprehensive MRSA colonization     surveillance program. It is not     intended to diagnose MRSA     infection nor to guide or     monitor treatment for     MRSA infections.     CRITICAL RESULT CALLED TO, READ BACK BY AND VERIFIED WITH:     M.GRAY AT 2110 ON 01/06/14 BY S.VANHOORNE   Studies/Results: Dg Chest 1 View  01/09/2014   CLINICAL DATA:  Followup of congestive heart failure.  EXAM: CHEST - 1 VIEW  COMPARISON:  01/06/2014 and 12/06/2013  FINDINGS: Stable cardiomegaly. Diffuse pulmonary vascular congestion. There are bilateral interstitial and airspace opacities, most prominent perihilar regions and there saline hazy opacities bilaterally suggestive of bilateral posteriorly layering pleural effusions. Overall, aeration of the lungs appears slightly worse on today's chest radiograph compared to 01/06/2014.  Low right peritracheal soft tissue density measures up to 3 cm transverse dimension and could reflect distended azygos/ SVC in the setting of pulmonary venous hypertension, but lymphadenopathy or mass cannot be excluded.  IMPRESSION: Moderate to severe congestive heart failure pattern. Bilateral layering pleural effusions.  Soft tissue prominence in the lower right paratracheal region could be related to pulmonary venous congestion, but lymphadenopathy or mass cannot be completely excluded. Recommend attention on short-term follow-up PA and lateral chest radiograph with the patient's congestive heart failure improves.   These results will be called to the ordering clinician or representative by the Radiologist Assistant, and communication documented in the PACS or zVision Dashboard.   Electronically Signed   By: Curlene Dolphin M.D.   On: 01/09/2014 09:20   Ct Head Wo Contrast  01/10/2014   CLINICAL DATA:  Mental status changes  EXAM: CT HEAD WITHOUT CONTRAST  TECHNIQUE: Contiguous axial images were obtained from the base of the skull through the vertex without intravenous contrast.  COMPARISON:  None.  FINDINGS: No skull fracture is noted. Paranasal sinuses and mastoid air cells are unremarkable. There are motion artifacts.  No intracranial hemorrhage, mass effect or midline shift. No definite acute cortical infarction. No mass lesion is noted on this unenhanced scan. No hydrocephalus.  IMPRESSION: No acute intracranial abnormality. Motion artifacts are noted. No definite acute cortical infarction.   Electronically Signed   By: Lahoma Crocker M.D.   On: 01/10/2014 15:43   Dg Chest Port 1 View  01/11/2014   CLINICAL DATA:  Ventilated patient; history of COPD and CHF.  EXAM: PORTABLE CHEST - 1 VIEW  COMPARISON:  Portable chest x-ray of January 10, 2014  FINDINGS: The lungs are reasonably well inflated. Confluent alveolar infiltrates persist bilaterally but have slightly improved. The left hemidiaphragm remain obscured. No endotracheal tube is demonstrated. The cardiac silhouette remains enlarged and the pulmonary vascularity remains engorged.  IMPRESSION: CHF with moderate to severe pulmonary interstitial and alveolar edema. The lungs have slightly improved since yesterday's study.   Electronically Signed   By: David  Martinique   On: 01/11/2014 08:08   Dg Chest  Port 1 View  01/10/2014   CLINICAL DATA:  Acute respiratory failure.  EXAM: PORTABLE CHEST - 1 VIEW  COMPARISON:  01/09/2014.  FINDINGS: Patient is rotated to the right making evaluation mediastinum difficult today's exam . Severe cardiomegaly with pulmonary vascular prominence  and bilateral alveolar infiltrates consistent with congestive heart failure with pulmonary edema. Bilateral effusions are present. Similar findings on prior exam. No pneumothorax is No acute bony abnormality identified.  IMPRESSION: Congestive heart failure with bilateral pulmonary edema and pleural effusions. Similar findings noted on prior study.   Electronically Signed   By: Marcello Moores  Register   On: 01/10/2014 09:24    Medications:  Prior to Admission:  Prescriptions prior to admission  Medication Sig Dispense Refill  . albuterol (PROVENTIL) (2.5 MG/3ML) 0.083% nebulizer solution Take 3 mLs (2.5 mg total) by nebulization every 4 (four) hours as needed for wheezing or shortness of breath.  75 mL  12  . aspirin EC 81 MG EC tablet Take 1 tablet (81 mg total) by mouth daily.  30 tablet  12  . clonazePAM (KLONOPIN) 1 MG tablet Take 0.5 tablets (0.5 mg total) by mouth 4 (four) times daily as needed for anxiety.  30 tablet  0  . DULoxetine (CYMBALTA) 20 MG capsule Take 1 capsule (20 mg total) by mouth daily.  30 capsule  3  . folic acid (FOLVITE) 1 MG tablet Take 1 tablet (1 mg total) by mouth daily.  30 tablet  12  . furosemide (LASIX) 40 MG tablet Take 1 tablet (40 mg total) by mouth daily.  30 tablet  1  . HYDROcodone-acetaminophen (NORCO/VICODIN) 5-325 MG per tablet Take 1 tablet by mouth every 4 (four) hours as needed for moderate pain.      Marland Kitchen insulin lispro (HUMALOG) 100 UNIT/ML injection Inject 2-10 Units into the skin 3 (three) times daily before meals. SS      . levothyroxine (SYNTHROID, LEVOTHROID) 200 MCG tablet Take 200 mcg by mouth daily before breakfast.      . lisinopril (PRINIVIL,ZESTRIL) 5 MG tablet Take 1 tablet (5 mg total) by mouth daily.  30 tablet  1  . PARoxetine (PAXIL) 20 MG tablet Take 20 mg by mouth every morning.      . potassium chloride SA (K-DUR,KLOR-CON) 20 MEQ tablet Take 40 mEq by mouth daily.      . promethazine (PHENERGAN) 25 MG tablet Take 25 mg by mouth every 6  (six) hours as needed for nausea or vomiting.      . simvastatin (ZOCOR) 40 MG tablet Take 40 mg by mouth every morning.       . temazepam (RESTORIL) 15 MG capsule Take 15 mg by mouth at bedtime as needed for sleep.       Scheduled: . antiseptic oral rinse  15 mL Mouth Rinse BID  . antiseptic oral rinse  15 mL Mouth Rinse BID  . aspirin EC  81 mg Oral Daily  . ciprofloxacin  500 mg Oral BID  . DULoxetine  20 mg Oral Daily  . folic acid  1 mg Oral Daily  . furosemide  20 mg Intravenous Q12H  . hydrocortisone   Rectal BID  . insulin aspart  0-20 Units Subcutaneous TID WC  . ipratropium-albuterol  3 mL Nebulization Q4H  . levothyroxine  200 mcg Oral QAC breakfast  . mupirocin ointment  1 application Nasal BID  . nystatin   Topical BID  . pantoprazole  40 mg Oral BID AC  . PARoxetine  20 mg Oral Daily  . simvastatin  40 mg Oral q morning - 10a   Continuous: . sodium chloride Stopped (01/10/14 1500)  . sodium chloride     PJS:RPRXYVOPF, benzonatate, clonazePAM, HYDROcodone-acetaminophen, ondansetron (ZOFRAN) IV, ondansetron, sodium chloride  Assesment: She was admitted with anemia and required blood transfusions. She developed acute respiratory failure in and has been on BiPAP. Her blood gases have improved. She is still poorly responsive to my exam and her ammonia level was normal. She should be better this was purely respiratory problems so I'm going to go ahead and ask for neurology consultation Active Problems:   Anemia    Plan: For neurology consultation    LOS: 5 days   [name] Green L 01/11/2014, 8:50 AM

## 2014-01-11 NOTE — Progress Notes (Signed)
Pt seem to be more awake at 12:00 and RT decided to take BIPAP off and Pt is responding and talking on the phone, Pt is on 2 lnc.

## 2014-01-12 DIAGNOSIS — K625 Hemorrhage of anus and rectum: Secondary | ICD-10-CM

## 2014-01-12 DIAGNOSIS — R195 Other fecal abnormalities: Secondary | ICD-10-CM

## 2014-01-12 LAB — GLUCOSE, CAPILLARY
GLUCOSE-CAPILLARY: 98 mg/dL (ref 70–99)
GLUCOSE-CAPILLARY: 94 mg/dL (ref 70–99)
GLUCOSE-CAPILLARY: 146 mg/dL — AB (ref 70–99)
Glucose-Capillary: 199 mg/dL — ABNORMAL HIGH (ref 70–99)

## 2014-01-12 LAB — CBC
HCT: 30.7 % — ABNORMAL LOW (ref 36.0–46.0)
HEMOGLOBIN: 9 g/dL — AB (ref 12.0–15.0)
MCH: 30.2 pg (ref 26.0–34.0)
MCHC: 29.3 g/dL — AB (ref 30.0–36.0)
MCV: 103 fL — ABNORMAL HIGH (ref 78.0–100.0)
Platelets: 145 10*3/uL — ABNORMAL LOW (ref 150–400)
RBC: 2.98 MIL/uL — AB (ref 3.87–5.11)
RDW: 23.2 % — ABNORMAL HIGH (ref 11.5–15.5)
WBC: 9.8 10*3/uL (ref 4.0–10.5)

## 2014-01-12 LAB — BASIC METABOLIC PANEL
Anion gap: 6 (ref 5–15)
BUN: 35 mg/dL — ABNORMAL HIGH (ref 6–23)
CO2: 39 mEq/L — ABNORMAL HIGH (ref 19–32)
Calcium: 9.2 mg/dL (ref 8.4–10.5)
Chloride: 98 mEq/L (ref 96–112)
Creatinine, Ser: 1.45 mg/dL — ABNORMAL HIGH (ref 0.50–1.10)
GFR calc non Af Amer: 39 mL/min — ABNORMAL LOW (ref >90)
GFR, EST AFRICAN AMERICAN: 46 mL/min — AB (ref >90)
Glucose, Bld: 105 mg/dL — ABNORMAL HIGH (ref 70–99)
POTASSIUM: 4.6 meq/L (ref 3.7–5.3)
SODIUM: 143 meq/L (ref 137–147)

## 2014-01-12 MED ORDER — LEVOFLOXACIN IN D5W 500 MG/100ML IV SOLN
500.0000 mg | INTRAVENOUS | Status: DC
  Administered 2014-01-12 – 2014-01-13 (×2): 500 mg via INTRAVENOUS
  Filled 2014-01-12 (×4): qty 100

## 2014-01-12 MED ORDER — METHYLPREDNISOLONE SODIUM SUCC 40 MG IJ SOLR
40.0000 mg | Freq: Four times a day (QID) | INTRAMUSCULAR | Status: DC
  Administered 2014-01-12 – 2014-01-20 (×32): 40 mg via INTRAVENOUS
  Filled 2014-01-12 (×35): qty 1

## 2014-01-12 MED ORDER — FUROSEMIDE 10 MG/ML IJ SOLN
40.0000 mg | Freq: Once | INTRAMUSCULAR | Status: AC
  Administered 2014-01-12: 40 mg via INTRAVENOUS
  Filled 2014-01-12: qty 4

## 2014-01-12 MED ORDER — FUROSEMIDE 10 MG/ML IJ SOLN
40.0000 mg | Freq: Once | INTRAMUSCULAR | Status: AC
  Administered 2014-01-13: 40 mg via INTRAVENOUS

## 2014-01-12 NOTE — Progress Notes (Signed)
WHEN PT WAS BEING TURNED AND REPOSITIONED SHE HAD A SMEAR OF BRIGHT RED BLOOD FROM RECTUM.

## 2014-01-12 NOTE — Progress Notes (Signed)
REVIEWED.  

## 2014-01-12 NOTE — Progress Notes (Signed)
Subjective: Patient feels better this morning. She is off BIPAP now. No fever or chills. No chest pain..  Objective: Vital signs in last 24 hours: Temp:  [96.3 F (35.7 C)-98.4 F (36.9 C)] 96.3 F (35.7 C) (07/31 0758) Pulse Rate:  [42-112] 109 (07/31 0200) Resp:  [7-27] 8 (07/31 0200) BP: (80-112)/(32-64) 102/51 mmHg (07/31 0200) SpO2:  [86 %-100 %] 97 % (07/31 0200) FiO2 (%):  [35 %] 35 % (07/30 1104) Weight:  [163.8 kg (361 lb 1.8 oz)] 163.8 kg (361 lb 1.8 oz) (07/31 0504) Weight change: 0 kg (0 lb) Last BM Date: 01/06/14  Intake/Output from previous day: 07/30 0701 - 07/31 0700 In: -  Out: 800 [Urine:800]  PHYSICAL EXAM General appearance: alert and no distress Resp: diminished breath sounds bilaterally and rhonchi bilaterally Cardio: S1, S2 normal GI: soft, non-tender; bowel sounds normal; no masses,  no organomegaly Extremities: edema 2++  Lab Results:  Results for orders placed during the hospital encounter of 01/06/14 (from the past 48 hour(s))  BLOOD GAS, ARTERIAL     Status: Abnormal   Collection Time    01/10/14  8:43 AM      Result Value Ref Range   FIO2 100.00     pH, Arterial 7.190 (*) 7.350 - 7.450   Comment: CRITICAL RESULT CALLED TO, READ BACK BY AND VERIFIED WITH:     CINDY MORRISON RN BY ROBIN POWELL RRT AT 0850 ON 01/10/14   pCO2 arterial 101.0 (*) 35.0 - 45.0 mmHg   Comment: CRITICAL RESULT CALLED TO, READ BACK BY AND VERIFIED WITH:     CINDY MORRISON RN BY ROBIN POWELL RRT ON 01/10/14 AT 0850   pO2, Arterial 77.8 (*) 80.0 - 100.0 mmHg   Bicarbonate 37.0 (*) 20.0 - 24.0 mEq/L   TCO2 36.3  0 - 100 mmol/L   Acid-Base Excess 8.9 (*) 0.0 - 2.0 mmol/L   O2 Saturation 94.6     Patient temperature 37.0     Collection site RIGHT RADIAL     Drawn by [id number]     Sample type ARTERIAL     Allens test (pass/fail) PASS  PASS  TROPONIN I     Status: None   Collection Time    01/10/14  8:48 AM      Result Value Ref Range   Troponin I <0.30  <0.30 ng/mL    Comment:            Due to the release kinetics of cTnI,     a negative result within the first hours     of the onset of symptoms does not rule out     myocardial infarction with certainty.     If myocardial infarction is still suspected,     repeat the test at appropriate intervals.  BLOOD GAS, ARTERIAL     Status: Abnormal   Collection Time    01/10/14 11:35 AM      Result Value Ref Range   FIO2 30.00     Delivery systems BILEVEL POSITIVE AIRWAY PRESSURE     Mode BILEVEL POSITIVE AIRWAY PRESSURE     Inspiratory PAP 18     Expiratory PAP 6     pH, Arterial 7.339 (*) 7.350 - 7.450   pCO2 arterial 68.2 (*) 35.0 - 45.0 mmHg   Comment: CRITICAL RESULT CALLED TO, READ BACK BY AND VERIFIED WITH:     JENNIFER COFFEY RN BY ROBIN POWELL RRT ON 01/10/14 AT 1140   pO2, Arterial 52.4 (*) 80.0 -  100.0 mmHg   Bicarbonate 35.8 (*) 20.0 - 24.0 mEq/L   TCO2 33.0  0 - 100 mmol/L   Acid-Base Excess 9.9 (*) 0.0 - 2.0 mmol/L   O2 Saturation 87.8     Patient temperature 37.0     Collection site LEFT RADIAL     Drawn by [id number]     Sample type ARTERIAL     Allens test (pass/fail) PASS  PASS  GLUCOSE, CAPILLARY     Status: None   Collection Time    01/10/14 11:49 AM      Result Value Ref Range   Glucose-Capillary 93  70 - 99 mg/dL  TROPONIN I     Status: None   Collection Time    01/10/14  2:26 PM      Result Value Ref Range   Troponin I <0.30  <0.30 ng/mL   Comment:            Due to the release kinetics of cTnI,     a negative result within the first hours     of the onset of symptoms does not rule out     myocardial infarction with certainty.     If myocardial infarction is still suspected,     repeat the test at appropriate intervals.  GLUCOSE, CAPILLARY     Status: Abnormal   Collection Time    01/10/14  4:40 PM      Result Value Ref Range   Glucose-Capillary 102 (*) 70 - 99 mg/dL  TROPONIN I     Status: None   Collection Time    01/10/14  8:05 PM      Result Value Ref Range    Troponin I <0.30  <0.30 ng/mL   Comment:            Due to the release kinetics of cTnI,     a negative result within the first hours     of the onset of symptoms does not rule out     myocardial infarction with certainty.     If myocardial infarction is still suspected,     repeat the test at appropriate intervals.  AMMONIA     Status: None   Collection Time    01/10/14  8:05 PM      Result Value Ref Range   Ammonia 18  11 - 60 umol/L  GLUCOSE, CAPILLARY     Status: None   Collection Time    01/10/14 10:51 PM      Result Value Ref Range   Glucose-Capillary 98  70 - 99 mg/dL   Comment 1 Documented in Chart     Comment 2 Notify RN    CBC     Status: Abnormal   Collection Time    01/11/14  4:14 AM      Result Value Ref Range   WBC 8.4  4.0 - 10.5 K/uL   RBC 2.74 (*) 3.87 - 5.11 MIL/uL   Hemoglobin 8.3 (*) 12.0 - 15.0 g/dL   HCT 27.5 (*) 36.0 - 46.0 %   MCV 100.4 (*) 78.0 - 100.0 fL   MCH 30.3  26.0 - 34.0 pg   MCHC 30.2  30.0 - 36.0 g/dL   RDW 23.4 (*) 11.5 - 15.5 %   Platelets 137 (*) 150 - 400 K/uL   Comment: DELTA CHECK NOTED  COMPREHENSIVE METABOLIC PANEL     Status: Abnormal   Collection Time    01/11/14  4:14 AM  Result Value Ref Range   Sodium 145  137 - 147 mEq/L   Potassium 4.5  3.7 - 5.3 mEq/L   Chloride 99  96 - 112 mEq/L   CO2 40 (*) 19 - 32 mEq/L   Comment: CRITICAL RESULT CALLED TO, READ BACK BY AND VERIFIED WITH:     MCDANIEL,M AT 5:55AM ON 01/11/14 BY FESTERMAN,C   Glucose, Bld 99  70 - 99 mg/dL   BUN 32 (*) 6 - 23 mg/dL   Creatinine, Ser 1.47 (*) 0.50 - 1.10 mg/dL   Calcium 9.0  8.4 - 10.5 mg/dL   Total Protein 6.1  6.0 - 8.3 g/dL   Albumin 2.4 (*) 3.5 - 5.2 g/dL   AST 29  0 - 37 U/L   ALT 11  0 - 35 U/L   Alkaline Phosphatase 117  39 - 117 U/L   Total Bilirubin 3.1 (*) 0.3 - 1.2 mg/dL   GFR calc non Af Amer 39 (*) >90 mL/min   GFR calc Af Amer 45 (*) >90 mL/min   Comment: (NOTE)     The eGFR has been calculated using the CKD EPI equation.      This calculation has not been validated in all clinical situations.     eGFR's persistently <90 mL/min signify possible Chronic Kidney     Disease.   Anion gap 6  5 - 15  BLOOD GAS, ARTERIAL     Status: Abnormal   Collection Time    01/11/14  5:00 AM      Result Value Ref Range   FIO2 40.00     Delivery systems BILEVEL POSITIVE AIRWAY PRESSURE     Inspiratory PAP 18.0     Expiratory PAP 6.0     pH, Arterial 7.371  7.350 - 7.450   pCO2 arterial 67.4 (*) 35.0 - 45.0 mmHg   Comment: CRITICAL RESULT CALLED TO, READ BACK BY AND VERIFIED WITH:     MCDANIEL, M. RN AT 8341 01/11/14 ANDERSON, S.RRT   pO2, Arterial 99.4  80.0 - 100.0 mmHg   Bicarbonate 38.1 (*) 20.0 - 24.0 mEq/L   TCO2 36.6  0 - 100 mmol/L   Acid-Base Excess 12.4 (*) 0.0 - 2.0 mmol/L   O2 Saturation 98.2     Patient temperature 37.0     Collection site RIGHT RADIAL     Drawn by [id number]     Sample type ARTERIAL     Allens test (pass/fail) PASS  PASS  GLUCOSE, CAPILLARY     Status: None   Collection Time    01/11/14  7:33 AM      Result Value Ref Range   Glucose-Capillary 90  70 - 99 mg/dL  GLUCOSE, CAPILLARY     Status: None   Collection Time    01/11/14 11:18 AM      Result Value Ref Range   Glucose-Capillary 94  70 - 99 mg/dL  GLUCOSE, CAPILLARY     Status: None   Collection Time    01/11/14  4:27 PM      Result Value Ref Range   Glucose-Capillary 95  70 - 99 mg/dL  GLUCOSE, CAPILLARY     Status: None   Collection Time    01/11/14  8:18 PM      Result Value Ref Range   Glucose-Capillary 97  70 - 99 mg/dL   Comment 1 Notify RN    GLUCOSE, CAPILLARY     Status: None   Collection Time  01/12/14  7:25 AM      Result Value Ref Range   Glucose-Capillary 98  70 - 99 mg/dL   Comment 1 Documented in Chart     Comment 2 Notify RN      ABGS  Recent Labs  01/11/14 0500  PHART 7.371  PO2ART 99.4  TCO2 36.6  HCO3 38.1*   CULTURES Recent Results (from the past 240 hour(s))  URINE CULTURE     Status:  None   Collection Time    01/06/14  2:02 PM      Result Value Ref Range Status   Specimen Description URINE, CATHETERIZED   Final   Special Requests NONE   Final   Culture  Setup Time     Final   Value: 01/06/2014 21:21     Performed at [place]     Final   Value: >=100,000 COLONIES/ML     Performed at [facility]   Culture     Final   Value: KLEBSIELLA PNEUMONIAE     Performed at [facility]   Report Status 01/08/2014 FINAL   Final   Organism ID, Bacteria KLEBSIELLA PNEUMONIAE   Final  MRSA PCR SCREENING     Status: Abnormal   Collection Time    01/06/14  6:25 PM      Result Value Ref Range Status   MRSA by PCR POSITIVE (*) NEGATIVE Final   Comment:            The GeneXpert MRSA Assay (FDA     approved for NASAL specimens     only), is one component of a     comprehensive MRSA colonization     surveillance program. It is not     intended to diagnose MRSA     infection nor to guide or     monitor treatment for     MRSA infections.     CRITICAL RESULT CALLED TO, READ BACK BY AND VERIFIED WITH:     M.GRAY AT 2110 ON 01/06/14 BY S.VANHOORNE   Studies/Results: Ct Head Wo Contrast  01/10/2014   CLINICAL DATA:  Mental status changes  EXAM: CT HEAD WITHOUT CONTRAST  TECHNIQUE: Contiguous axial images were obtained from the base of the skull through the vertex without intravenous contrast.  COMPARISON:  None.  FINDINGS: No skull fracture is noted. Paranasal sinuses and mastoid air cells are unremarkable. There are motion artifacts.  No intracranial hemorrhage, mass effect or midline shift. No definite acute cortical infarction. No mass lesion is noted on this unenhanced scan. No hydrocephalus.  IMPRESSION: No acute intracranial abnormality. Motion artifacts are noted. No definite acute cortical infarction.   Electronically Signed   By: Lahoma Crocker M.D.   On: 01/10/2014 15:43   Dg Chest Port 1 View  01/11/2014   CLINICAL DATA:  Ventilated  patient; history of COPD and CHF.  EXAM: PORTABLE CHEST - 1 VIEW  COMPARISON:  Portable chest x-ray of January 10, 2014  FINDINGS: The lungs are reasonably well inflated. Confluent alveolar infiltrates persist bilaterally but have slightly improved. The left hemidiaphragm remain obscured. No endotracheal tube is demonstrated. The cardiac silhouette remains enlarged and the pulmonary vascularity remains engorged.  IMPRESSION: CHF with moderate to severe pulmonary interstitial and alveolar edema. The lungs have slightly improved since yesterday's study.   Electronically Signed   By: David  Martinique   On: 01/11/2014 08:08   Dg Chest Port 1 View  01/10/2014  CLINICAL DATA:  Acute respiratory failure.  EXAM: PORTABLE CHEST - 1 VIEW  COMPARISON:  01/09/2014.  FINDINGS: Patient is rotated to the right making evaluation mediastinum difficult today's exam . Severe cardiomegaly with pulmonary vascular prominence and bilateral alveolar infiltrates consistent with congestive heart failure with pulmonary edema. Bilateral effusions are present. Similar findings on prior exam. No pneumothorax is No acute bony abnormality identified.  IMPRESSION: Congestive heart failure with bilateral pulmonary edema and pleural effusions. Similar findings noted on prior study.   Electronically Signed   By: Marcello Moores  Register   On: 01/10/2014 09:24    Medications: I have reviewed the patient's current medications.  Assesment: Active Problems:   Anemia Pulmonary edema  UTI  Diabetes mellitus  History of hypothyroidism  COPD    Plan: Medications reviewed Continue current treatment As per pulmonary recommendation Will monitor CBC/BMP and and ABG    LOS: 6 days   Elisama Thissen 01/12/2014, 8:33 AM

## 2014-01-12 NOTE — Progress Notes (Signed)
Patient refuses to wear BIPAP at this time, told patient that she really needed to wear tonight so her C02 wouldn't increase, but patient still refuses. Patient is on 3LNC at this time 02 saturations at 98%-bipap on standby at bedside. RT will continue to monitor

## 2014-01-12 NOTE — Progress Notes (Signed)
Subjective:  RT present during exam. States patient refused BIPAP last night. While in room, after neb tx, patient desat to 72%. Verbal stimulation to take deep breaths, sats came back to 95%. She knows where she is but speech difficult to understand. Quickly falls back to sleep. Denies abd pain. Says she is hungry.   Objective: Vital signs in last 24 hours: Temp:  [97 F (36.1 C)-98.4 F (36.9 C)] 97.6 F (36.4 C) (07/31 0504) Pulse Rate:  [42-112] 109 (07/31 0200) Resp:  [7-27] 8 (07/31 0200) BP: (80-112)/(32-64) 102/51 mmHg (07/31 0200) SpO2:  [86 %-100 %] 97 % (07/31 0200) FiO2 (%):  [35 %] 35 % (07/30 1104) Weight:  [361 lb 1.8 oz (163.8 kg)] 361 lb 1.8 oz (163.8 kg) (07/31 0504) Last BM Date: 01/06/14 General:   Chronically ill-appearing. Drowsy. Head:  Normocephalic and atraumatic. Eyes:  Sclera clear, no icterus.  Chest: distant breath sounds.    Heart:  Regular rate and rhythm; no murmurs, clicks, rubs,  or gallops. Abdomen:  Soft, obese, exam limited by body habitus. nontender.   Extremities:  Without clubbing, deformity. 1-2+LE edema. Neurologic:  Drowsy. oriented x2;    Skin:  Intact without significant lesions or rashes. Psych:  drowsy.  Intake/Output from previous day: 07/30 0701 - 07/31 0700 In: -  Out: 800 [Urine:800] Intake/Output this shift:    Lab Results: CBC  Recent Labs  01/10/14 0555 01/11/14 0414 01/12/14 0854  WBC 12.8* 8.4 9.8  HGB 9.3* 8.3* 9.0*  HCT 31.5* 27.5* 30.7*  MCV 101.3* 100.4* 103.0*  PLT 181 137* 145*   BMET  Recent Labs  01/10/14 0555 01/11/14 0414  NA 144 145  K 5.4* 4.5  CL 98 99  CO2 40* 40*  GLUCOSE 102* 99  BUN 25* 32*  CREATININE 1.27* 1.47*  CALCIUM 8.8 9.0   LFTs  Recent Labs  01/10/14 0555 01/11/14 0414  BILITOT 4.5* 3.1*  ALKPHOS 134* 117  AST 31 29  ALT 11 11  PROT 6.5 6.1  ALBUMIN 2.6* 2.4*   No results found for this basename: LIPASE,  in the last 72 hours PT/INR No results found for this  basename: LABPROT, INR,  in the last 72 hours    Assessment: 58 year old female admitted with acute on chronic CHF, anemia, heme positive stool with only low-volume hematochezia noted incidentally this admission, developed worsening cardiopulmonary status couple of days ago. EGD postponed. Hgb stable. She has received four units of prbcs this admission.    Elevated LFTs: with normal transaminases. Likely secondary to known heart failure. Continue to follow   Hematochezia: recently noted during routine assessment by nursing, likely benign anorectal source, hx of  hemorrhoids. Anusol added. No further evidence   Plan: 1. EGD prior to discharge. 2. Colonoscopy as outpatient.  3. Carb modified diet while alert. 4. PPI BID. 5. CBC, Met-7 ordered today. BUN/cre up yesterday, patient has not eaten in over two days, fluids NaCL 20cc/hr.    LOS: 6 days   Neil Crouch  01/12/2014, 7:58 AM

## 2014-01-12 NOTE — Progress Notes (Signed)
Subjective: She is awake and alert. She's much more responsive. She has no new complaints.  Objective: Vital signs in last 24 hours: Temp:  [96.3 F (35.7 C)-98.4 F (36.9 C)] 96.3 F (35.7 C) (07/31 0758) Pulse Rate:  [101-112] 109 (07/31 0200) Resp:  [7-27] 8 (07/31 0200) BP: (80-112)/(32-64) 102/51 mmHg (07/31 0200) SpO2:  [97 %-100 %] 98 % (07/31 0830) FiO2 (%):  [35 %] 35 % (07/30 1104) Weight:  [163.8 kg (361 lb 1.8 oz)] 163.8 kg (361 lb 1.8 oz) (07/31 0504) Weight change: 0 kg (0 lb) Last BM Date: 01/06/14  Intake/Output from previous day: 07/30 0701 - 07/31 0700 In: -  Out: 800 [Urine:800]  PHYSICAL EXAM General appearance: alert, cooperative and morbidly obese Resp: Diminished breath sounds Cardio: regular rate and rhythm, S1, S2 normal, no murmur, click, rub or gallop GI: soft, non-tender; bowel sounds normal; no masses,  no organomegaly Extremities: 2+ edema  Lab Results:  Results for orders placed during the hospital encounter of 01/06/14 (from the past 48 hour(s))  BLOOD GAS, ARTERIAL     Status: Abnormal   Collection Time    01/10/14 11:35 AM      Result Value Ref Range   FIO2 30.00     Delivery systems BILEVEL POSITIVE AIRWAY PRESSURE     Mode BILEVEL POSITIVE AIRWAY PRESSURE     Inspiratory PAP 18     Expiratory PAP 6     pH, Arterial 7.339 (*) 7.350 - 7.450   pCO2 arterial 68.2 (*) 35.0 - 45.0 mmHg   Comment: CRITICAL RESULT CALLED TO, READ BACK BY AND VERIFIED WITH:     JENNIFER COFFEY RN BY ROBIN POWELL RRT ON 01/10/14 AT 1140   pO2, Arterial 52.4 (*) 80.0 - 100.0 mmHg   Bicarbonate 35.8 (*) 20.0 - 24.0 mEq/L   TCO2 33.0  0 - 100 mmol/L   Acid-Base Excess 9.9 (*) 0.0 - 2.0 mmol/L   O2 Saturation 87.8     Patient temperature 37.0     Collection site LEFT RADIAL     Drawn by [id number]     Sample type ARTERIAL     Allens test (pass/fail) PASS  PASS  GLUCOSE, CAPILLARY     Status: None   Collection Time    01/10/14 11:49 AM      Result Value Ref  Range   Glucose-Capillary 93  70 - 99 mg/dL  TROPONIN I     Status: None   Collection Time    01/10/14  2:26 PM      Result Value Ref Range   Troponin I <0.30  <0.30 ng/mL   Comment:            Due to the release kinetics of cTnI,     a negative result within the first hours     of the onset of symptoms does not rule out     myocardial infarction with certainty.     If myocardial infarction is still suspected,     repeat the test at appropriate intervals.  GLUCOSE, CAPILLARY     Status: Abnormal   Collection Time    01/10/14  4:40 PM      Result Value Ref Range   Glucose-Capillary 102 (*) 70 - 99 mg/dL  TROPONIN I     Status: None   Collection Time    01/10/14  8:05 PM      Result Value Ref Range   Troponin I <0.30  <0.30 ng/mL  Comment:            Due to the release kinetics of cTnI,     a negative result within the first hours     of the onset of symptoms does not rule out     myocardial infarction with certainty.     If myocardial infarction is still suspected,     repeat the test at appropriate intervals.  AMMONIA     Status: None   Collection Time    01/10/14  8:05 PM      Result Value Ref Range   Ammonia 18  11 - 60 umol/L  GLUCOSE, CAPILLARY     Status: None   Collection Time    01/10/14 10:51 PM      Result Value Ref Range   Glucose-Capillary 98  70 - 99 mg/dL   Comment 1 Documented in Chart     Comment 2 Notify RN    CBC     Status: Abnormal   Collection Time    01/11/14  4:14 AM      Result Value Ref Range   WBC 8.4  4.0 - 10.5 K/uL   RBC 2.74 (*) 3.87 - 5.11 MIL/uL   Hemoglobin 8.3 (*) 12.0 - 15.0 g/dL   HCT 27.5 (*) 36.0 - 46.0 %   MCV 100.4 (*) 78.0 - 100.0 fL   MCH 30.3  26.0 - 34.0 pg   MCHC 30.2  30.0 - 36.0 g/dL   RDW 23.4 (*) 11.5 - 15.5 %   Platelets 137 (*) 150 - 400 K/uL   Comment: DELTA CHECK NOTED  COMPREHENSIVE METABOLIC PANEL     Status: Abnormal   Collection Time    01/11/14  4:14 AM      Result Value Ref Range   Sodium 145  137  - 147 mEq/L   Potassium 4.5  3.7 - 5.3 mEq/L   Chloride 99  96 - 112 mEq/L   CO2 40 (*) 19 - 32 mEq/L   Comment: CRITICAL RESULT CALLED TO, READ BACK BY AND VERIFIED WITH:     MCDANIEL,M AT 5:55AM ON 01/11/14 BY FESTERMAN,C   Glucose, Bld 99  70 - 99 mg/dL   BUN 32 (*) 6 - 23 mg/dL   Creatinine, Ser 1.47 (*) 0.50 - 1.10 mg/dL   Calcium 9.0  8.4 - 10.5 mg/dL   Total Protein 6.1  6.0 - 8.3 g/dL   Albumin 2.4 (*) 3.5 - 5.2 g/dL   AST 29  0 - 37 U/L   ALT 11  0 - 35 U/L   Alkaline Phosphatase 117  39 - 117 U/L   Total Bilirubin 3.1 (*) 0.3 - 1.2 mg/dL   GFR calc non Af Amer 39 (*) >90 mL/min   GFR calc Af Amer 45 (*) >90 mL/min   Comment: (NOTE)     The eGFR has been calculated using the CKD EPI equation.     This calculation has not been validated in all clinical situations.     eGFR's persistently <90 mL/min signify possible Chronic Kidney     Disease.   Anion gap 6  5 - 15  BLOOD GAS, ARTERIAL     Status: Abnormal   Collection Time    01/11/14  5:00 AM      Result Value Ref Range   FIO2 40.00     Delivery systems BILEVEL POSITIVE AIRWAY PRESSURE     Inspiratory PAP 18.0     Expiratory PAP 6.0  pH, Arterial 7.371  7.350 - 7.450   pCO2 arterial 67.4 (*) 35.0 - 45.0 mmHg   Comment: CRITICAL RESULT CALLED TO, READ BACK BY AND VERIFIED WITH:     MCDANIEL, M. RN AT 0529 01/11/14 ANDERSON, S.RRT   pO2, Arterial 99.4  80.0 - 100.0 mmHg   Bicarbonate 38.1 (*) 20.0 - 24.0 mEq/L   TCO2 36.6  0 - 100 mmol/L   Acid-Base Excess 12.4 (*) 0.0 - 2.0 mmol/L   O2 Saturation 98.2     Patient temperature 37.0     Collection site RIGHT RADIAL     Drawn by [id number]     Sample type ARTERIAL     Allens test (pass/fail) PASS  PASS  GLUCOSE, CAPILLARY     Status: None   Collection Time    01/11/14  7:33 AM      Result Value Ref Range   Glucose-Capillary 90  70 - 99 mg/dL  GLUCOSE, CAPILLARY     Status: None   Collection Time    01/11/14 11:18 AM      Result Value Ref Range    Glucose-Capillary 94  70 - 99 mg/dL  GLUCOSE, CAPILLARY     Status: None   Collection Time    01/11/14  4:27 PM      Result Value Ref Range   Glucose-Capillary 95  70 - 99 mg/dL  GLUCOSE, CAPILLARY     Status: None   Collection Time    01/11/14  8:18 PM      Result Value Ref Range   Glucose-Capillary 97  70 - 99 mg/dL   Comment 1 Notify RN    GLUCOSE, CAPILLARY     Status: None   Collection Time    01/12/14  7:25 AM      Result Value Ref Range   Glucose-Capillary 98  70 - 99 mg/dL   Comment 1 Documented in Chart     Comment 2 Notify RN      ABGS  Recent Labs  01/11/14 0500  PHART 7.371  PO2ART 99.4  TCO2 36.6  HCO3 38.1*   CULTURES Recent Results (from the past 240 hour(s))  URINE CULTURE     Status: None   Collection Time    01/06/14  2:02 PM      Result Value Ref Range Status   Specimen Description URINE, CATHETERIZED   Final   Special Requests NONE   Final   Culture  Setup Time     Final   Value: 01/06/2014 21:21     Performed at [place]     Final   Value: >=100,000 COLONIES/ML     Performed at [facility]   Culture     Final   Value: KLEBSIELLA PNEUMONIAE     Performed at [facility]   Report Status 01/08/2014 FINAL   Final   Organism ID, Bacteria KLEBSIELLA PNEUMONIAE   Final  MRSA PCR SCREENING     Status: Abnormal   Collection Time    01/06/14  6:25 PM      Result Value Ref Range Status   MRSA by PCR POSITIVE (*) NEGATIVE Final   Comment:            The GeneXpert MRSA Assay (FDA     approved for NASAL specimens     only), is one component of a     comprehensive MRSA colonization     surveillance program. It is not  intended to diagnose MRSA     infection nor to guide or     monitor treatment for     MRSA infections.     CRITICAL RESULT CALLED TO, READ BACK BY AND VERIFIED WITH:     M.GRAY AT 2110 ON 01/06/14 BY S.VANHOORNE   Studies/Results: Ct Head Wo Contrast  01/10/2014   CLINICAL DATA:   Mental status changes  EXAM: CT HEAD WITHOUT CONTRAST  TECHNIQUE: Contiguous axial images were obtained from the base of the skull through the vertex without intravenous contrast.  COMPARISON:  None.  FINDINGS: No skull fracture is noted. Paranasal sinuses and mastoid air cells are unremarkable. There are motion artifacts.  No intracranial hemorrhage, mass effect or midline shift. No definite acute cortical infarction. No mass lesion is noted on this unenhanced scan. No hydrocephalus.  IMPRESSION: No acute intracranial abnormality. Motion artifacts are noted. No definite acute cortical infarction.   Electronically Signed   By: Lahoma Crocker M.D.   On: 01/10/2014 15:43   Dg Chest Port 1 View  01/11/2014   CLINICAL DATA:  Ventilated patient; history of COPD and CHF.  EXAM: PORTABLE CHEST - 1 VIEW  COMPARISON:  Portable chest x-ray of January 10, 2014  FINDINGS: The lungs are reasonably well inflated. Confluent alveolar infiltrates persist bilaterally but have slightly improved. The left hemidiaphragm remain obscured. No endotracheal tube is demonstrated. The cardiac silhouette remains enlarged and the pulmonary vascularity remains engorged.  IMPRESSION: CHF with moderate to severe pulmonary interstitial and alveolar edema. The lungs have slightly improved since yesterday's study.   Electronically Signed   By: David  Martinique   On: 01/11/2014 08:08   Dg Chest Port 1 View  01/10/2014   CLINICAL DATA:  Acute respiratory failure.  EXAM: PORTABLE CHEST - 1 VIEW  COMPARISON:  01/09/2014.  FINDINGS: Patient is rotated to the right making evaluation mediastinum difficult today's exam . Severe cardiomegaly with pulmonary vascular prominence and bilateral alveolar infiltrates consistent with congestive heart failure with pulmonary edema. Bilateral effusions are present. Similar findings on prior exam. No pneumothorax is No acute bony abnormality identified.  IMPRESSION: Congestive heart failure with bilateral pulmonary edema and  pleural effusions. Similar findings noted on prior study.   Electronically Signed   By: Marcello Moores  Register   On: 01/10/2014 09:24    Medications:  Prior to Admission:  Prescriptions prior to admission  Medication Sig Dispense Refill  . albuterol (PROVENTIL) (2.5 MG/3ML) 0.083% nebulizer solution Take 3 mLs (2.5 mg total) by nebulization every 4 (four) hours as needed for wheezing or shortness of breath.  75 mL  12  . aspirin EC 81 MG EC tablet Take 1 tablet (81 mg total) by mouth daily.  30 tablet  12  . clonazePAM (KLONOPIN) 1 MG tablet Take 0.5 tablets (0.5 mg total) by mouth 4 (four) times daily as needed for anxiety.  30 tablet  0  . DULoxetine (CYMBALTA) 20 MG capsule Take 1 capsule (20 mg total) by mouth daily.  30 capsule  3  . folic acid (FOLVITE) 1 MG tablet Take 1 tablet (1 mg total) by mouth daily.  30 tablet  12  . furosemide (LASIX) 40 MG tablet Take 1 tablet (40 mg total) by mouth daily.  30 tablet  1  . HYDROcodone-acetaminophen (NORCO/VICODIN) 5-325 MG per tablet Take 1 tablet by mouth every 4 (four) hours as needed for moderate pain.      Marland Kitchen insulin lispro (HUMALOG) 100 UNIT/ML injection Inject 2-10 Units into  the skin 3 (three) times daily before meals. SS      . levothyroxine (SYNTHROID, LEVOTHROID) 200 MCG tablet Take 200 mcg by mouth daily before breakfast.      . lisinopril (PRINIVIL,ZESTRIL) 5 MG tablet Take 1 tablet (5 mg total) by mouth daily.  30 tablet  1  . PARoxetine (PAXIL) 20 MG tablet Take 20 mg by mouth every morning.      . potassium chloride SA (K-DUR,KLOR-CON) 20 MEQ tablet Take 40 mEq by mouth daily.      . promethazine (PHENERGAN) 25 MG tablet Take 25 mg by mouth every 6 (six) hours as needed for nausea or vomiting.      . simvastatin (ZOCOR) 40 MG tablet Take 40 mg by mouth every morning.       . temazepam (RESTORIL) 15 MG capsule Take 15 mg by mouth at bedtime as needed for sleep.       Scheduled: . antiseptic oral rinse  15 mL Mouth Rinse BID  .  antiseptic oral rinse  15 mL Mouth Rinse BID  . aspirin EC  81 mg Oral Daily  . ciprofloxacin  500 mg Oral BID  . DULoxetine  20 mg Oral Daily  . folic acid  1 mg Oral Daily  . furosemide  20 mg Intravenous Q12H  . hydrocortisone   Rectal BID  . insulin aspart  0-20 Units Subcutaneous TID WC  . ipratropium-albuterol  3 mL Nebulization Q4H  . levofloxacin (LEVAQUIN) IV  500 mg Intravenous Q24H  . levothyroxine  200 mcg Oral QAC breakfast  . methylPREDNISolone (SOLU-MEDROL) injection  40 mg Intravenous Q6H  . mupirocin ointment  1 application Nasal BID  . nystatin   Topical BID  . pantoprazole  40 mg Oral BID AC  . PARoxetine  20 mg Oral Daily  . simvastatin  40 mg Oral q morning - 10a   Continuous: . sodium chloride Stopped (01/10/14 1500)  . sodium chloride     FMM:CRFVOHKGO, benzonatate, clonazePAM, HYDROcodone-acetaminophen, ondansetron (ZOFRAN) IV, ondansetron, sodium chloride  Assesment: She was admitted with anemia and required blood transfusion. She developed respiratory failure which may be from fluid and blood resuscitation. She required BiPAP for about 48 hours but is better now. She is more awake. She says she's hungry. Active Problems:   Anemia    Plan: Continue current treatments. She does have COPD as well so I added treatment for her COPD.    LOS: 6 days   [name] Green L 01/12/2014, 9:00 AM

## 2014-01-13 LAB — CBC
HCT: 30.2 % — ABNORMAL LOW (ref 36.0–46.0)
Hemoglobin: 8.6 g/dL — ABNORMAL LOW (ref 12.0–15.0)
MCH: 30.4 pg (ref 26.0–34.0)
MCHC: 28.5 g/dL — AB (ref 30.0–36.0)
MCV: 106.7 fL — ABNORMAL HIGH (ref 78.0–100.0)
PLATELETS: 119 10*3/uL — AB (ref 150–400)
RBC: 2.83 MIL/uL — ABNORMAL LOW (ref 3.87–5.11)
RDW: 22.3 % — AB (ref 11.5–15.5)
WBC: 5.5 10*3/uL (ref 4.0–10.5)

## 2014-01-13 LAB — GLUCOSE, CAPILLARY
GLUCOSE-CAPILLARY: 156 mg/dL — AB (ref 70–99)
Glucose-Capillary: 139 mg/dL — ABNORMAL HIGH (ref 70–99)
Glucose-Capillary: 172 mg/dL — ABNORMAL HIGH (ref 70–99)
Glucose-Capillary: 131 mg/dL — ABNORMAL HIGH (ref 70–99)

## 2014-01-13 LAB — BASIC METABOLIC PANEL
ANION GAP: 6 (ref 5–15)
BUN: 41 mg/dL — ABNORMAL HIGH (ref 6–23)
CALCIUM: 9.3 mg/dL (ref 8.4–10.5)
CO2: 39 meq/L — AB (ref 19–32)
Chloride: 98 mEq/L (ref 96–112)
Creatinine, Ser: 1.61 mg/dL — ABNORMAL HIGH (ref 0.50–1.10)
GFR, EST AFRICAN AMERICAN: 40 mL/min — AB (ref >90)
GFR, EST NON AFRICAN AMERICAN: 35 mL/min — AB (ref >90)
Glucose, Bld: 147 mg/dL — ABNORMAL HIGH (ref 70–99)
Potassium: 5.1 mEq/L (ref 3.7–5.3)
SODIUM: 143 meq/L (ref 137–147)

## 2014-01-13 LAB — BLOOD GAS, ARTERIAL
Acid-Base Excess: 11.3 mmol/L — ABNORMAL HIGH (ref 0.0–2.0)
BICARBONATE: 37.9 meq/L — AB (ref 20.0–24.0)
DELIVERY SYSTEMS: POSITIVE
Drawn by: 105551
EXPIRATORY PAP: 6
FIO2: 0.35 %
Inspiratory PAP: 16
LHR: 11 {breaths}/min
O2 SAT: 95.1 %
Patient temperature: 37
TCO2: 36.7 mmol/L (ref 0–100)
pCO2 arterial: 79.9 mmHg (ref 35.0–45.0)
pH, Arterial: 7.297 — ABNORMAL LOW (ref 7.350–7.450)
pO2, Arterial: 74.2 mmHg — ABNORMAL LOW (ref 80.0–100.0)

## 2014-01-13 MED ORDER — FUROSEMIDE 10 MG/ML IJ SOLN: 20.0000 mg | Freq: Two times a day (BID) | INTRAMUSCULAR | Status: DC

## 2014-01-13 MED ORDER — FUROSEMIDE 10 MG/ML IJ SOLN
40.0000 mg | Freq: Two times a day (BID) | INTRAMUSCULAR | Status: DC
  Administered 2014-01-14: 40 mg via INTRAVENOUS
  Filled 2014-01-13 (×2): qty 4

## 2014-01-13 MED ORDER — NYSTATIN 100000 UNIT/GM EX CREA
TOPICAL_CREAM | CUTANEOUS | Status: AC
  Filled 2014-01-13: qty 15

## 2014-01-13 MED ORDER — NYSTATIN 100000 UNIT/GM EX CREA
TOPICAL_CREAM | Freq: Two times a day (BID) | CUTANEOUS | Status: DC
  Administered 2014-01-13 – 2014-01-16 (×5): via TOPICAL
  Administered 2014-01-16: 1 via TOPICAL
  Administered 2014-01-17: 22:00:00 via TOPICAL
  Administered 2014-01-17: 1 via TOPICAL
  Administered 2014-01-18 (×2): via TOPICAL
  Administered 2014-01-19: 1 via TOPICAL
  Administered 2014-01-19 – 2014-01-22 (×6): via TOPICAL
  Filled 2014-01-13: qty 15

## 2014-01-13 MED ORDER — LEVOFLOXACIN 500 MG PO TABS
500.0000 mg | ORAL_TABLET | Freq: Every day | ORAL | Status: DC
  Administered 2014-01-14: 500 mg via ORAL
  Filled 2014-01-13: qty 1

## 2014-01-13 NOTE — Progress Notes (Signed)
Subjective: Patient is using BIPAP during the night. She is more and awake. No fever or chills. Objective: Vital signs in last 24 hours: Temp:  [96.2 F (35.7 C)-98 F (36.7 C)] 98 F (36.7 C) (08/01 0400) Pulse Rate:  [88-100] 95 (08/01 0734) Resp:  [7-18] 14 (08/01 0600) BP: (81-107)/(42-75) 91/52 mmHg (08/01 0600) SpO2:  [89 %-100 %] 94 % (08/01 0735) FiO2 (%):  [35 %] 35 % (08/01 0735) Weight:  [164.3 kg (362 lb 3.5 oz)] 164.3 kg (362 lb 3.5 oz) (07/31 2257) Weight change: 0.5 kg (1 lb 1.6 oz) Last BM Date: 01/06/14  Intake/Output from previous day: 07/31 0701 - 08/01 0700 In: 910 [P.O.:600; I.V.:210; IV Piggyback:100] Out: 900 [Urine:900]  PHYSICAL EXAM General appearance: alert and no distress Resp: diminished breath sounds bilaterally and rhonchi bilaterally Cardio: S1, S2 normal GI: soft, non-tender; bowel sounds normal; no masses,  no organomegaly Extremities: edema 2++  Lab Results:  Results for orders placed during the hospital encounter of 01/06/14 (from the past 48 hour(s))  GLUCOSE, CAPILLARY     Status: None   Collection Time    01/11/14 11:18 AM      Result Value Ref Range   Glucose-Capillary 94  70 - 99 mg/dL  GLUCOSE, CAPILLARY     Status: None   Collection Time    01/11/14  4:27 PM      Result Value Ref Range   Glucose-Capillary 95  70 - 99 mg/dL  GLUCOSE, CAPILLARY     Status: None   Collection Time    01/11/14  8:18 PM      Result Value Ref Range   Glucose-Capillary 97  70 - 99 mg/dL   Comment 1 Notify RN    GLUCOSE, CAPILLARY     Status: None   Collection Time    01/12/14  7:25 AM      Result Value Ref Range   Glucose-Capillary 98  70 - 99 mg/dL   Comment 1 Documented in Chart     Comment 2 Notify RN    CBC     Status: Abnormal   Collection Time    01/12/14  8:54 AM      Result Value Ref Range   WBC 9.8  4.0 - 10.5 K/uL   RBC 2.98 (*) 3.87 - 5.11 MIL/uL   Hemoglobin 9.0 (*) 12.0 - 15.0 g/dL   HCT 30.7 (*) 36.0 - 46.0 %   MCV 103.0  (*) 78.0 - 100.0 fL   MCH 30.2  26.0 - 34.0 pg   MCHC 29.3 (*) 30.0 - 36.0 g/dL   RDW 23.2 (*) 11.5 - 15.5 %   Platelets 145 (*) 150 - 400 K/uL  BASIC METABOLIC PANEL     Status: Abnormal   Collection Time    01/12/14  8:54 AM      Result Value Ref Range   Sodium 143  137 - 147 mEq/L   Potassium 4.6  3.7 - 5.3 mEq/L   Chloride 98  96 - 112 mEq/L   CO2 39 (*) 19 - 32 mEq/L   Glucose, Bld 105 (*) 70 - 99 mg/dL   BUN 35 (*) 6 - 23 mg/dL   Creatinine, Ser 1.45 (*) 0.50 - 1.10 mg/dL   Calcium 9.2  8.4 - 10.5 mg/dL   GFR calc non Af Amer 39 (*) >90 mL/min   GFR calc Af Amer 46 (*) >90 mL/min   Comment: (NOTE)     The eGFR has been  calculated using the CKD EPI equation.     This calculation has not been validated in all clinical situations.     eGFR's persistently <90 mL/min signify possible Chronic Kidney     Disease.   Anion gap 6  5 - 15  GLUCOSE, CAPILLARY     Status: None   Collection Time    01/12/14 11:07 AM      Result Value Ref Range   Glucose-Capillary 94  70 - 99 mg/dL   Comment 1 Documented in Chart     Comment 2 Notify RN    GLUCOSE, CAPILLARY     Status: Abnormal   Collection Time    01/12/14  4:02 PM      Result Value Ref Range   Glucose-Capillary 199 (*) 70 - 99 mg/dL   Comment 1 Documented in Chart     Comment 2 Notify RN    GLUCOSE, CAPILLARY     Status: Abnormal   Collection Time    01/12/14  8:50 PM      Result Value Ref Range   Glucose-Capillary 146 (*) 70 - 99 mg/dL   Comment 1 Notify RN    CBC     Status: Abnormal   Collection Time    01/13/14  4:38 AM      Result Value Ref Range   WBC 5.5  4.0 - 10.5 K/uL   RBC 2.83 (*) 3.87 - 5.11 MIL/uL   Hemoglobin 8.6 (*) 12.0 - 15.0 g/dL   HCT 30.2 (*) 36.0 - 46.0 %   MCV 106.7 (*) 78.0 - 100.0 fL   MCH 30.4  26.0 - 34.0 pg   MCHC 28.5 (*) 30.0 - 36.0 g/dL   RDW 22.3 (*) 11.5 - 15.5 %   Platelets 119 (*) 150 - 400 K/uL   Comment: SPECIMEN CHECKED FOR CLOTS     PLATELET COUNT CONFIRMED BY SMEAR  BASIC  METABOLIC PANEL     Status: Abnormal   Collection Time    01/13/14  4:38 AM      Result Value Ref Range   Sodium 143  137 - 147 mEq/L   Potassium 5.1  3.7 - 5.3 mEq/L   Chloride 98  96 - 112 mEq/L   CO2 39 (*) 19 - 32 mEq/L   Glucose, Bld 147 (*) 70 - 99 mg/dL   BUN 41 (*) 6 - 23 mg/dL   Creatinine, Ser 1.61 (*) 0.50 - 1.10 mg/dL   Calcium 9.3  8.4 - 10.5 mg/dL   GFR calc non Af Amer 35 (*) >90 mL/min   GFR calc Af Amer 40 (*) >90 mL/min   Comment: (NOTE)     The eGFR has been calculated using the CKD EPI equation.     This calculation has not been validated in all clinical situations.     eGFR's persistently <90 mL/min signify possible Chronic Kidney     Disease.   Anion gap 6  5 - 15  BLOOD GAS, ARTERIAL     Status: Abnormal   Collection Time    01/13/14  5:26 AM      Result Value Ref Range   FIO2 0.35     Delivery systems BILEVEL POSITIVE AIRWAY PRESSURE     Mode OXYGEN MASK     Rate 11     Inspiratory PAP 16     Expiratory PAP 6.0     pH, Arterial 7.297 (*) 7.350 - 7.450   pCO2 arterial 79.9 (*) 35.0 -  45.0 mmHg   Comment: CRITICAL RESULT CALLED TO, READ BACK BY AND VERIFIED WITH:      TO BREWER D RN AT 8889 01/13/2014 BY [facility] RRT   pO2, Arterial 74.2 (*) 80.0 - 100.0 mmHg   Bicarbonate 37.9 (*) 20.0 - 24.0 mEq/L   TCO2 36.7  0 - 100 mmol/L   Acid-Base Excess 11.3 (*) 0.0 - 2.0 mmol/L   O2 Saturation 95.1     Patient temperature 37.0     Collection site RADIAL     Drawn by [id number]     Sample type ARTERIAL     Allens test (pass/fail) PASS  PASS  GLUCOSE, CAPILLARY     Status: Abnormal   Collection Time    01/13/14  7:36 AM      Result Value Ref Range   Glucose-Capillary 139 (*) 70 - 99 mg/dL   Comment 1 Documented in Chart     Comment 2 Notify RN      ABGS  Recent Labs  01/13/14 0526  PHART 7.297*  PO2ART 74.2*  TCO2 36.7  HCO3 37.9*   CULTURES Recent Results (from the past 240 hour(s))  URINE CULTURE     Status: None   Collection Time    01/06/14   2:02 PM      Result Value Ref Range Status   Specimen Description URINE, CATHETERIZED   Final   Special Requests NONE   Final   Culture  Setup Time     Final   Value: 01/06/2014 21:21     Performed at [place]     Final   Value: >=100,000 COLONIES/ML     Performed at [facility]   Culture     Final   Value: KLEBSIELLA PNEUMONIAE     Performed at [facility]   Report Status 01/08/2014 FINAL   Final   Organism ID, Bacteria KLEBSIELLA PNEUMONIAE   Final  MRSA PCR SCREENING     Status: Abnormal   Collection Time    01/06/14  6:25 PM      Result Value Ref Range Status   MRSA by PCR POSITIVE (*) NEGATIVE Final   Comment:            The GeneXpert MRSA Assay (FDA     approved for NASAL specimens     only), is one component of a     comprehensive MRSA colonization     surveillance program. It is not     intended to diagnose MRSA     infection nor to guide or     monitor treatment for     MRSA infections.     CRITICAL RESULT CALLED TO, READ BACK BY AND VERIFIED WITH:     M.GRAY AT 2110 ON 01/06/14 BY S.VANHOORNE   Studies/Results: No results found.  Medications: I have reviewed the patient's current medications.  Assesment: Active Problems:   Anemia Pulmonary edema  UTI  Diabetes mellitus  History of hypothyroidism  COPD    Plan: Medications reviewed Continue current treatment As per pulmonary recommendation Will monitor CBC/BMP and and ABG    LOS: 7 days   Hammad Finkler 01/13/2014, 8:01 AM

## 2014-01-13 NOTE — Progress Notes (Signed)
Unable to place PICC in patient. Dr. Juanetta GoslingHawkins notified and suggested to him to use interventional radiology for placement . Able to start a second peripheral IV site to left wrist.

## 2014-01-13 NOTE — Progress Notes (Signed)
Subjective: She seems a little better. She is using BiPAP at night and she's off BiPAP now and awake and able to have conversations with me. I had long discussion with her sister and told her that with [name] Green multiple medical problems that she is high risk for complications and even death over the next 6 months. I'm not sure she totally understands that. [name] Green has decided that she would have intubation and mechanical ventilation.  Objective: Vital signs in last 24 hours: Temp:  [96.2 F (35.7 C)-98 F (36.7 C)] 97.7 F (36.5 C) (08/01 0833) Pulse Rate:  [88-100] 95 (08/01 0734) Resp:  [7-18] 14 (08/01 0600) BP: (81-107)/(42-75) 91/52 mmHg (08/01 0600) SpO2:  [89 %-100 %] 94 % (08/01 0735) FiO2 (%):  [35 %] 35 % (08/01 0735) Weight:  [164.3 kg (362 lb 3.5 oz)] 164.3 kg (362 lb 3.5 oz) (07/31 2257) Weight change: 0.5 kg (1 lb 1.6 oz) Last BM Date: 01/06/14  Intake/Output from previous day: 07/31 0701 - 08/01 0700 In: 910 [P.O.:600; I.V.:210; IV Piggyback:100] Out: 900 [Urine:900]  PHYSICAL EXAM General appearance: alert, mild distress and morbidly obese Resp: rhonchi bilaterally Cardio: Heart sounds are distant but I do not hear a gallop GI: soft, non-tender; bowel sounds normal; no masses,  no organomegaly Extremities: She still has 2+ edema.  Lab Results:  Results for orders placed during the hospital encounter of 01/06/14 (from the past 48 hour(s))  GLUCOSE, CAPILLARY     Status: None   Collection Time    01/11/14 11:18 AM      Result Value Ref Range   Glucose-Capillary 94  70 - 99 mg/dL  GLUCOSE, CAPILLARY     Status: None   Collection Time    01/11/14  4:27 PM      Result Value Ref Range   Glucose-Capillary 95  70 - 99 mg/dL  GLUCOSE, CAPILLARY     Status: None   Collection Time    01/11/14  8:18 PM      Result Value Ref Range   Glucose-Capillary 97  70 - 99 mg/dL   Comment 1 Notify RN    GLUCOSE, CAPILLARY     Status: None   Collection Time   01/12/14  7:25 AM      Result Value Ref Range   Glucose-Capillary 98  70 - 99 mg/dL   Comment 1 Documented in Chart     Comment 2 Notify RN    CBC     Status: Abnormal   Collection Time    01/12/14  8:54 AM      Result Value Ref Range   WBC 9.8  4.0 - 10.5 K/uL   RBC 2.98 (*) 3.87 - 5.11 MIL/uL   Hemoglobin 9.0 (*) 12.0 - 15.0 g/dL   HCT 30.7 (*) 36.0 - 46.0 %   MCV 103.0 (*) 78.0 - 100.0 fL   MCH 30.2  26.0 - 34.0 pg   MCHC 29.3 (*) 30.0 - 36.0 g/dL   RDW 23.2 (*) 11.5 - 15.5 %   Platelets 145 (*) 150 - 400 K/uL  BASIC METABOLIC PANEL     Status: Abnormal   Collection Time    01/12/14  8:54 AM      Result Value Ref Range   Sodium 143  137 - 147 mEq/L   Potassium 4.6  3.7 - 5.3 mEq/L   Chloride 98  96 - 112 mEq/L   CO2 39 (*) 19 - 32 mEq/L   Glucose, Bld  105 (*) 70 - 99 mg/dL   BUN 35 (*) 6 - 23 mg/dL   Creatinine, Ser 1.45 (*) 0.50 - 1.10 mg/dL   Calcium 9.2  8.4 - 10.5 mg/dL   GFR calc non Af Amer 39 (*) >90 mL/min   GFR calc Af Amer 46 (*) >90 mL/min   Comment: (NOTE)     The eGFR has been calculated using the CKD EPI equation.     This calculation has not been validated in all clinical situations.     eGFR's persistently <90 mL/min signify possible Chronic Kidney     Disease.   Anion gap 6  5 - 15  GLUCOSE, CAPILLARY     Status: None   Collection Time    01/12/14 11:07 AM      Result Value Ref Range   Glucose-Capillary 94  70 - 99 mg/dL   Comment 1 Documented in Chart     Comment 2 Notify RN    GLUCOSE, CAPILLARY     Status: Abnormal   Collection Time    01/12/14  4:02 PM      Result Value Ref Range   Glucose-Capillary 199 (*) 70 - 99 mg/dL   Comment 1 Documented in Chart     Comment 2 Notify RN    GLUCOSE, CAPILLARY     Status: Abnormal   Collection Time    01/12/14  8:50 PM      Result Value Ref Range   Glucose-Capillary 146 (*) 70 - 99 mg/dL   Comment 1 Notify RN    CBC     Status: Abnormal   Collection Time    01/13/14  4:38 AM      Result Value  Ref Range   WBC 5.5  4.0 - 10.5 K/uL   RBC 2.83 (*) 3.87 - 5.11 MIL/uL   Hemoglobin 8.6 (*) 12.0 - 15.0 g/dL   HCT 30.2 (*) 36.0 - 46.0 %   MCV 106.7 (*) 78.0 - 100.0 fL   MCH 30.4  26.0 - 34.0 pg   MCHC 28.5 (*) 30.0 - 36.0 g/dL   RDW 22.3 (*) 11.5 - 15.5 %   Platelets 119 (*) 150 - 400 K/uL   Comment: SPECIMEN CHECKED FOR CLOTS     PLATELET COUNT CONFIRMED BY SMEAR  BASIC METABOLIC PANEL     Status: Abnormal   Collection Time    01/13/14  4:38 AM      Result Value Ref Range   Sodium 143  137 - 147 mEq/L   Potassium 5.1  3.7 - 5.3 mEq/L   Chloride 98  96 - 112 mEq/L   CO2 39 (*) 19 - 32 mEq/L   Glucose, Bld 147 (*) 70 - 99 mg/dL   BUN 41 (*) 6 - 23 mg/dL   Creatinine, Ser 1.61 (*) 0.50 - 1.10 mg/dL   Calcium 9.3  8.4 - 10.5 mg/dL   GFR calc non Af Amer 35 (*) >90 mL/min   GFR calc Af Amer 40 (*) >90 mL/min   Comment: (NOTE)     The eGFR has been calculated using the CKD EPI equation.     This calculation has not been validated in all clinical situations.     eGFR's persistently <90 mL/min signify possible Chronic Kidney     Disease.   Anion gap 6  5 - 15  BLOOD GAS, ARTERIAL     Status: Abnormal   Collection Time    01/13/14  5:26 AM  Result Value Ref Range   FIO2 0.35     Delivery systems BILEVEL POSITIVE AIRWAY PRESSURE     Mode OXYGEN MASK     Rate 11     Inspiratory PAP 16     Expiratory PAP 6.0     pH, Arterial 7.297 (*) 7.350 - 7.450   pCO2 arterial 79.9 (*) 35.0 - 45.0 mmHg   Comment: CRITICAL RESULT CALLED TO, READ BACK BY AND VERIFIED WITH:      TO BREWER D RN AT 9798 01/13/2014 BY [facility] RRT   pO2, Arterial 74.2 (*) 80.0 - 100.0 mmHg   Bicarbonate 37.9 (*) 20.0 - 24.0 mEq/L   TCO2 36.7  0 - 100 mmol/L   Acid-Base Excess 11.3 (*) 0.0 - 2.0 mmol/L   O2 Saturation 95.1     Patient temperature 37.0     Collection site RADIAL     Drawn by [id number]     Sample type ARTERIAL     Allens test (pass/fail) PASS  PASS  GLUCOSE, CAPILLARY     Status: Abnormal    Collection Time    01/13/14  7:36 AM      Result Value Ref Range   Glucose-Capillary 139 (*) 70 - 99 mg/dL   Comment 1 Documented in Chart     Comment 2 Notify RN      ABGS  Recent Labs  01/13/14 0526  PHART 7.297*  PO2ART 74.2*  TCO2 36.7  HCO3 37.9*   CULTURES Recent Results (from the past 240 hour(s))  URINE CULTURE     Status: None   Collection Time    01/06/14  2:02 PM      Result Value Ref Range Status   Specimen Description URINE, CATHETERIZED   Final   Special Requests NONE   Final   Culture  Setup Time     Final   Value: 01/06/2014 21:21     Performed at [facility]     Final   Value: >=100,000 COLONIES/ML     Performed at [facility]   Culture     Final   Value: KLEBSIELLA PNEUMONIAE     Performed at [facility]   Report Status 01/08/2014 FINAL   Final   Organism ID, Bacteria KLEBSIELLA PNEUMONIAE   Final  MRSA PCR SCREENING     Status: Abnormal   Collection Time    01/06/14  6:25 PM      Result Value Ref Range Status   MRSA by PCR POSITIVE (*) NEGATIVE Final   Comment:            The GeneXpert MRSA Assay (FDA     approved for NASAL specimens     only), is one component of a     comprehensive MRSA colonization     surveillance program. It is not     intended to diagnose MRSA     infection nor to guide or     monitor treatment for     MRSA infections.     CRITICAL RESULT CALLED TO, READ BACK BY AND VERIFIED WITH:     M.GRAY AT 2110 ON 01/06/14 BY S.VANHOORNE   Studies/Results: No results found.  Medications:  Prior to Admission:  Prescriptions prior to admission  Medication Sig Dispense Refill  . albuterol (PROVENTIL) (2.5 MG/3ML) 0.083% nebulizer solution Take 3 mLs (2.5 mg total) by nebulization every 4 (four) hours as needed for wheezing or shortness of breath.  75  mL  12  . aspirin EC 81 MG EC tablet Take 1 tablet (81 mg total) by mouth daily.  30 tablet  12  . clonazePAM (KLONOPIN) 1 MG tablet Take  0.5 tablets (0.5 mg total) by mouth 4 (four) times daily as needed for anxiety.  30 tablet  0  . DULoxetine (CYMBALTA) 20 MG capsule Take 1 capsule (20 mg total) by mouth daily.  30 capsule  3  . folic acid (FOLVITE) 1 MG tablet Take 1 tablet (1 mg total) by mouth daily.  30 tablet  12  . furosemide (LASIX) 40 MG tablet Take 1 tablet (40 mg total) by mouth daily.  30 tablet  1  . HYDROcodone-acetaminophen (NORCO/VICODIN) 5-325 MG per tablet Take 1 tablet by mouth every 4 (four) hours as needed for moderate pain.      Marland Kitchen insulin lispro (HUMALOG) 100 UNIT/ML injection Inject 2-10 Units into the skin 3 (three) times daily before meals. SS      . levothyroxine (SYNTHROID, LEVOTHROID) 200 MCG tablet Take 200 mcg by mouth daily before breakfast.      . lisinopril (PRINIVIL,ZESTRIL) 5 MG tablet Take 1 tablet (5 mg total) by mouth daily.  30 tablet  1  . PARoxetine (PAXIL) 20 MG tablet Take 20 mg by mouth every morning.      . potassium chloride SA (K-DUR,KLOR-CON) 20 MEQ tablet Take 40 mEq by mouth daily.      . promethazine (PHENERGAN) 25 MG tablet Take 25 mg by mouth every 6 (six) hours as needed for nausea or vomiting.      . simvastatin (ZOCOR) 40 MG tablet Take 40 mg by mouth every morning.       . temazepam (RESTORIL) 15 MG capsule Take 15 mg by mouth at bedtime as needed for sleep.       Scheduled: . antiseptic oral rinse  15 mL Mouth Rinse BID  . antiseptic oral rinse  15 mL Mouth Rinse BID  . aspirin EC  81 mg Oral Daily  . DULoxetine  20 mg Oral Daily  . folic acid  1 mg Oral Daily  . furosemide  20 mg Intravenous Q12H  . furosemide  40 mg Intravenous Once  . hydrocortisone   Rectal BID  . insulin aspart  0-20 Units Subcutaneous TID WC  . ipratropium-albuterol  3 mL Nebulization Q4H  . levofloxacin (LEVAQUIN) IV  500 mg Intravenous Q24H  . levothyroxine  200 mcg Oral QAC breakfast  . methylPREDNISolone (SOLU-MEDROL) injection  40 mg Intravenous Q6H  . nystatin   Topical BID  .  pantoprazole  40 mg Oral BID AC  . PARoxetine  20 mg Oral Daily  . simvastatin  40 mg Oral q morning - 10a   Continuous: . sodium chloride 10 mL/hr at 01/13/14 0400  . sodium chloride     HEN:IDPOEUMPN, benzonatate, clonazePAM, HYDROcodone-acetaminophen, ondansetron (ZOFRAN) IV, ondansetron, sodium chloride  Assesment: She was admitted with anemia. She developed increasing problems with heart failure. She has COPD. She has respiratory failure and has required BiPAP. She has morbid obesity. Her renal function is not totally normal. Her intake and output is approximately even Active Problems:   Anemia    Plan: She may need increasing her diuresis. She still has fluid in her legs but seems to be better as far as her chest is concerned. She's on antibiotics and steroids. She seems a little better this morning    LOS: 7 days   Glennis Borger L 01/13/2014, 9:13  AM   

## 2014-01-13 NOTE — Progress Notes (Addendum)
Pt  Has been on and off bipap  Throughout the day.

## 2014-01-13 NOTE — Plan of Care (Signed)
Problem: Phase I Progression Outcomes Goal: Dyspnea controlled at rest (HF) Outcome: Not Progressing Pt continues to have frequent episode of lethargy that requires need for bipap to be reapplied.

## 2014-01-13 NOTE — Progress Notes (Signed)
PHARMACIST - PHYSICIAN COMMUNICATION DR:   Felecia ShellingFanta / Hawkin CONCERNING: Antibiotic IV to Oral Route Change Policy  RECOMMENDATION: This patient is receiving Levaquin by the intravenous route.  Based on criteria approved by the Pharmacy and Therapeutics Committee, the antibiotic(s) is/are being converted to the equivalent oral dose form(s).   DESCRIPTION: These criteria include:  Patient being treated for a respiratory tract infection, urinary tract infection, cellulitis or clostridium difficile associated diarrhea if on metronidazole  The patient is not neutropenic and does not exhibit a GI malabsorption state  The patient is eating (either orally or via tube) and/or has been taking other orally administered medications for a least 24 hours  The patient is improving clinically and has a Tmax < 100.5  If you have questions about this conversion, please contact the Pharmacy Department  [x]   [phone]( [phone] )  [facility] []   [phone]( [phone] )  [facility]  []   [phone]( [phone] )  [facility] []   [phone]( [phone] )  [facility]  Mady GemmaHayes, Kellene Mccleary R, [facility]  01/13/2014 12:46 PM

## 2014-01-13 NOTE — Plan of Care (Signed)
Problem: Phase I Progression Outcomes Goal: Voiding-avoid urinary catheter unless indicated Outcome: Not Progressing Frequent period of unresponsiveness requiring bipap, pt receiving iv lasix for diuresis

## 2014-01-14 DIAGNOSIS — R195 Other fecal abnormalities: Secondary | ICD-10-CM

## 2014-01-14 DIAGNOSIS — D500 Iron deficiency anemia secondary to blood loss (chronic): Secondary | ICD-10-CM

## 2014-01-14 DIAGNOSIS — D649 Anemia, unspecified: Secondary | ICD-10-CM

## 2014-01-14 LAB — COMPREHENSIVE METABOLIC PANEL
ALBUMIN: 2.6 g/dL — AB (ref 3.5–5.2)
ALT: 13 U/L (ref 0–35)
ANION GAP: 6 (ref 5–15)
AST: 32 U/L (ref 0–37)
Alkaline Phosphatase: 118 U/L — ABNORMAL HIGH (ref 39–117)
BUN: 53 mg/dL — ABNORMAL HIGH (ref 6–23)
CALCIUM: 9.1 mg/dL (ref 8.4–10.5)
CO2: 39 mEq/L — ABNORMAL HIGH (ref 19–32)
Chloride: 95 mEq/L — ABNORMAL LOW (ref 96–112)
Creatinine, Ser: 2.15 mg/dL — ABNORMAL HIGH (ref 0.50–1.10)
GFR calc Af Amer: 28 mL/min — ABNORMAL LOW (ref >90)
GFR calc non Af Amer: 24 mL/min — ABNORMAL LOW (ref >90)
GLUCOSE: 128 mg/dL — AB (ref 70–99)
Potassium: 5.3 mEq/L (ref 3.7–5.3)
SODIUM: 140 meq/L (ref 137–147)
TOTAL PROTEIN: 6.4 g/dL (ref 6.0–8.3)
Total Bilirubin: 1.9 mg/dL — ABNORMAL HIGH (ref 0.3–1.2)

## 2014-01-14 LAB — CBC WITH DIFFERENTIAL/PLATELET
BASOS ABS: 0 10*3/uL (ref 0.0–0.1)
BASOS PCT: 0 % (ref 0–1)
EOS ABS: 0 10*3/uL (ref 0.0–0.7)
Eosinophils Relative: 0 % (ref 0–5)
HCT: 27.6 % — ABNORMAL LOW (ref 36.0–46.0)
HEMOGLOBIN: 8.2 g/dL — AB (ref 12.0–15.0)
LYMPHS PCT: 3 % — AB (ref 12–46)
Lymphs Abs: 0.2 10*3/uL — ABNORMAL LOW (ref 0.7–4.0)
MCH: 30.1 pg (ref 26.0–34.0)
MCHC: 29.7 g/dL — AB (ref 30.0–36.0)
MCV: 101.5 fL — ABNORMAL HIGH (ref 78.0–100.0)
MONO ABS: 0.2 10*3/uL (ref 0.1–1.0)
Monocytes Relative: 3 % (ref 3–12)
NEUTROS PCT: 94 % — AB (ref 43–77)
Neutro Abs: 7 10*3/uL (ref 1.7–7.7)
Platelets: 126 10*3/uL — ABNORMAL LOW (ref 150–400)
RBC: 2.72 MIL/uL — ABNORMAL LOW (ref 3.87–5.11)
RDW: 22.7 % — ABNORMAL HIGH (ref 11.5–15.5)
WBC: 7.4 10*3/uL (ref 4.0–10.5)

## 2014-01-14 LAB — GLUCOSE, CAPILLARY
GLUCOSE-CAPILLARY: 129 mg/dL — AB (ref 70–99)
GLUCOSE-CAPILLARY: 161 mg/dL — AB (ref 70–99)
GLUCOSE-CAPILLARY: 143 mg/dL — AB (ref 70–99)

## 2014-01-14 MED ORDER — SODIUM CHLORIDE 0.9 % IV SOLN: INTRAVENOUS | Status: DC

## 2014-01-14 MED ORDER — FUROSEMIDE 10 MG/ML IJ SOLN: 40.0000 mg | Freq: Every day | INTRAMUSCULAR | Status: DC

## 2014-01-14 NOTE — Progress Notes (Signed)
PT is placed on BiPAP,  PT appears well.

## 2014-01-14 NOTE — Progress Notes (Signed)
PT off BiPAP at 0345 awake eating ice chips.

## 2014-01-14 NOTE — Progress Notes (Signed)
Subjective: She says she feels much better. She is more alert. She has no new complaints.  Objective: Vital signs in last 24 hours: Temp:  [98.3 F (36.8 C)-98.9 F (37.2 C)] 98.3 F (36.8 C) (08/02 0744) Pulse Rate:  [98-106] 101 (08/02 0800) Resp:  [14-91] 15 (08/02 0800) BP: (59-123)/(26-73) 94/54 mmHg (08/02 0800) SpO2:  [90 %-100 %] 100 % (08/02 0809) FiO2 (%):  [35 %] 35 % (08/01 2335) Weight:  [164.2 kg (361 lb 15.9 oz)] 164.2 kg (361 lb 15.9 oz) (08/02 0500) Weight change: -0.1 kg (-3.5 oz) Last BM Date: 01/06/14  Intake/Output from previous day: 08/01 0701 - 08/02 0700 In: 1600 [P.O.:1500; IV Piggyback:100] Out: 200 [Urine:200]  PHYSICAL EXAM General appearance: alert, cooperative, mild distress and morbidly obese Resp: clear to auscultation bilaterally Cardio: regular rate and rhythm, S1, S2 normal, no murmur, click, rub or gallop GI: soft, non-tender; bowel sounds normal; no masses,  no organomegaly Extremities: extremities normal, atraumatic, no cyanosis or edema  Lab Results:  Results for orders placed during the hospital encounter of 01/06/14 (from the past 48 hour(s))  GLUCOSE, CAPILLARY     Status: None   Collection Time    01/12/14 11:07 AM      Result Value Ref Range   Glucose-Capillary 94  70 - 99 mg/dL   Comment 1 Documented in Chart     Comment 2 Notify RN    GLUCOSE, CAPILLARY     Status: Abnormal   Collection Time    01/12/14  4:02 PM      Result Value Ref Range   Glucose-Capillary 199 (*) 70 - 99 mg/dL   Comment 1 Documented in Chart     Comment 2 Notify RN    GLUCOSE, CAPILLARY     Status: Abnormal   Collection Time    01/12/14  8:50 PM      Result Value Ref Range   Glucose-Capillary 146 (*) 70 - 99 mg/dL   Comment 1 Notify RN    CBC     Status: Abnormal   Collection Time    01/13/14  4:38 AM      Result Value Ref Range   WBC 5.5  4.0 - 10.5 K/uL   RBC 2.83 (*) 3.87 - 5.11 MIL/uL   Hemoglobin 8.6 (*) 12.0 - 15.0 g/dL   HCT 30.2 (*)  36.0 - 46.0 %   MCV 106.7 (*) 78.0 - 100.0 fL   MCH 30.4  26.0 - 34.0 pg   MCHC 28.5 (*) 30.0 - 36.0 g/dL   RDW 22.3 (*) 11.5 - 15.5 %   Platelets 119 (*) 150 - 400 K/uL   Comment: SPECIMEN CHECKED FOR CLOTS     PLATELET COUNT CONFIRMED BY SMEAR  BASIC METABOLIC PANEL     Status: Abnormal   Collection Time    01/13/14  4:38 AM      Result Value Ref Range   Sodium 143  137 - 147 mEq/L   Potassium 5.1  3.7 - 5.3 mEq/L   Chloride 98  96 - 112 mEq/L   CO2 39 (*) 19 - 32 mEq/L   Glucose, Bld 147 (*) 70 - 99 mg/dL   BUN 41 (*) 6 - 23 mg/dL   Creatinine, Ser 1.61 (*) 0.50 - 1.10 mg/dL   Calcium 9.3  8.4 - 10.5 mg/dL   GFR calc non Af Amer 35 (*) >90 mL/min   GFR calc Af Amer 40 (*) >90 mL/min   Comment: (NOTE)  The eGFR has been calculated using the CKD EPI equation.     This calculation has not been validated in all clinical situations.     eGFR's persistently <90 mL/min signify possible Chronic Kidney     Disease.   Anion gap 6  5 - 15  BLOOD GAS, ARTERIAL     Status: Abnormal   Collection Time    01/13/14  5:26 AM      Result Value Ref Range   FIO2 0.35     Delivery systems BILEVEL POSITIVE AIRWAY PRESSURE     Mode OXYGEN MASK     Rate 11     Inspiratory PAP 16     Expiratory PAP 6.0     pH, Arterial 7.297 (*) 7.350 - 7.450   pCO2 arterial 79.9 (*) 35.0 - 45.0 mmHg   Comment: CRITICAL RESULT CALLED TO, READ BACK BY AND VERIFIED WITH:      TO BREWER D RN AT 4098 01/13/2014 BY [facility] RRT   pO2, Arterial 74.2 (*) 80.0 - 100.0 mmHg   Bicarbonate 37.9 (*) 20.0 - 24.0 mEq/L   TCO2 36.7  0 - 100 mmol/L   Acid-Base Excess 11.3 (*) 0.0 - 2.0 mmol/L   O2 Saturation 95.1     Patient temperature 37.0     Collection site RADIAL     Drawn by [id number]     Sample type ARTERIAL     Allens test (pass/fail) PASS  PASS  GLUCOSE, CAPILLARY     Status: Abnormal   Collection Time    01/13/14  7:36 AM      Result Value Ref Range   Glucose-Capillary 139 (*) 70 - 99 mg/dL   Comment 1  Documented in Chart     Comment 2 Notify RN    GLUCOSE, CAPILLARY     Status: Abnormal   Collection Time    01/13/14 12:08 PM      Result Value Ref Range   Glucose-Capillary 156 (*) 70 - 99 mg/dL   Comment 1 Documented in Chart     Comment 2 Notify RN    GLUCOSE, CAPILLARY     Status: Abnormal   Collection Time    01/13/14  4:11 PM      Result Value Ref Range   Glucose-Capillary 172 (*) 70 - 99 mg/dL   Comment 1 Documented in Chart     Comment 2 Notify RN    GLUCOSE, CAPILLARY     Status: Abnormal   Collection Time    01/13/14  9:49 PM      Result Value Ref Range   Glucose-Capillary 131 (*) 70 - 99 mg/dL  CBC WITH DIFFERENTIAL     Status: Abnormal   Collection Time    01/14/14  4:13 AM      Result Value Ref Range   WBC 7.4  4.0 - 10.5 K/uL   RBC 2.72 (*) 3.87 - 5.11 MIL/uL   Hemoglobin 8.2 (*) 12.0 - 15.0 g/dL   HCT 27.6 (*) 36.0 - 46.0 %   MCV 101.5 (*) 78.0 - 100.0 fL   MCH 30.1  26.0 - 34.0 pg   MCHC 29.7 (*) 30.0 - 36.0 g/dL   RDW 22.7 (*) 11.5 - 15.5 %   Platelets 126 (*) 150 - 400 K/uL   Neutrophils Relative % 94 (*) 43 - 77 %   Lymphocytes Relative 3 (*) 12 - 46 %   Monocytes Relative 3  3 - 12 %   Eosinophils Relative  0  0 - 5 %   Basophils Relative 0  0 - 1 %   Neutro Abs 7.0  1.7 - 7.7 K/uL   Lymphs Abs 0.2 (*) 0.7 - 4.0 K/uL   Monocytes Absolute 0.2  0.1 - 1.0 K/uL   Eosinophils Absolute 0.0  0.0 - 0.7 K/uL   Basophils Absolute 0.0  0.0 - 0.1 K/uL   RBC Morphology TARGET CELLS     Comment: BASOPHILIC STIPPLING     POLYCHROMASIA PRESENT     ANISOCYTES  COMPREHENSIVE METABOLIC PANEL     Status: Abnormal   Collection Time    01/14/14  4:13 AM      Result Value Ref Range   Sodium 140  137 - 147 mEq/L   Potassium 5.3  3.7 - 5.3 mEq/L   Chloride 95 (*) 96 - 112 mEq/L   CO2 39 (*) 19 - 32 mEq/L   Glucose, Bld 128 (*) 70 - 99 mg/dL   BUN 53 (*) 6 - 23 mg/dL   Creatinine, Ser 2.15 (*) 0.50 - 1.10 mg/dL   Calcium 9.1  8.4 - 10.5 mg/dL   Total Protein 6.4   6.0 - 8.3 g/dL   Albumin 2.6 (*) 3.5 - 5.2 g/dL   AST 32  0 - 37 U/L   ALT 13  0 - 35 U/L   Alkaline Phosphatase 118 (*) 39 - 117 U/L   Total Bilirubin 1.9 (*) 0.3 - 1.2 mg/dL   GFR calc non Af Amer 24 (*) >90 mL/min   GFR calc Af Amer 28 (*) >90 mL/min   Comment: (NOTE)     The eGFR has been calculated using the CKD EPI equation.     This calculation has not been validated in all clinical situations.     eGFR's persistently <90 mL/min signify possible Chronic Kidney     Disease.   Anion gap 6  5 - 15  GLUCOSE, CAPILLARY     Status: Abnormal   Collection Time    01/14/14  7:29 AM      Result Value Ref Range   Glucose-Capillary 129 (*) 70 - 99 mg/dL    ABGS  Recent Labs  01/13/14 0526  PHART 7.297*  PO2ART 74.2*  TCO2 36.7  HCO3 37.9*   CULTURES Recent Results (from the past 240 hour(s))  URINE CULTURE     Status: None   Collection Time    01/06/14  2:02 PM      Result Value Ref Range Status   Specimen Description URINE, CATHETERIZED   Final   Special Requests NONE   Final   Culture  Setup Time     Final   Value: 01/06/2014 21:21     Performed at [place]     Final   Value: >=100,000 COLONIES/ML     Performed at [facility]   Culture     Final   Value: KLEBSIELLA PNEUMONIAE     Performed at [facility]   Report Status 01/08/2014 FINAL   Final   Organism ID, Bacteria KLEBSIELLA PNEUMONIAE   Final  MRSA PCR SCREENING     Status: Abnormal   Collection Time    01/06/14  6:25 PM      Result Value Ref Range Status   MRSA by PCR POSITIVE (*) NEGATIVE Final   Comment:            The GeneXpert MRSA Assay (FDA  approved for NASAL specimens     only), is one component of a     comprehensive MRSA colonization     surveillance program. It is not     intended to diagnose MRSA     infection nor to guide or     monitor treatment for     MRSA infections.     CRITICAL RESULT CALLED TO, READ BACK BY AND VERIFIED WITH:      M.GRAY AT 2110 ON 01/06/14 BY S.VANHOORNE   Studies/Results: No results found.  Medications:  Prior to Admission:  Prescriptions prior to admission  Medication Sig Dispense Refill  . albuterol (PROVENTIL) (2.5 MG/3ML) 0.083% nebulizer solution Take 3 mLs (2.5 mg total) by nebulization every 4 (four) hours as needed for wheezing or shortness of breath.  75 mL  12  . aspirin EC 81 MG EC tablet Take 1 tablet (81 mg total) by mouth daily.  30 tablet  12  . clonazePAM (KLONOPIN) 1 MG tablet Take 0.5 tablets (0.5 mg total) by mouth 4 (four) times daily as needed for anxiety.  30 tablet  0  . DULoxetine (CYMBALTA) 20 MG capsule Take 1 capsule (20 mg total) by mouth daily.  30 capsule  3  . folic acid (FOLVITE) 1 MG tablet Take 1 tablet (1 mg total) by mouth daily.  30 tablet  12  . furosemide (LASIX) 40 MG tablet Take 1 tablet (40 mg total) by mouth daily.  30 tablet  1  . HYDROcodone-acetaminophen (NORCO/VICODIN) 5-325 MG per tablet Take 1 tablet by mouth every 4 (four) hours as needed for moderate pain.      Marland Kitchen insulin lispro (HUMALOG) 100 UNIT/ML injection Inject 2-10 Units into the skin 3 (three) times daily before meals. SS      . levothyroxine (SYNTHROID, LEVOTHROID) 200 MCG tablet Take 200 mcg by mouth daily before breakfast.      . lisinopril (PRINIVIL,ZESTRIL) 5 MG tablet Take 1 tablet (5 mg total) by mouth daily.  30 tablet  1  . PARoxetine (PAXIL) 20 MG tablet Take 20 mg by mouth every morning.      . potassium chloride SA (K-DUR,KLOR-CON) 20 MEQ tablet Take 40 mEq by mouth daily.      . promethazine (PHENERGAN) 25 MG tablet Take 25 mg by mouth every 6 (six) hours as needed for nausea or vomiting.      . simvastatin (ZOCOR) 40 MG tablet Take 40 mg by mouth every morning.       . temazepam (RESTORIL) 15 MG capsule Take 15 mg by mouth at bedtime as needed for sleep.       Scheduled: . antiseptic oral rinse  15 mL Mouth Rinse BID  . antiseptic oral rinse  15 mL Mouth Rinse BID  . aspirin  EC  81 mg Oral Daily  . DULoxetine  20 mg Oral Daily  . folic acid  1 mg Oral Daily  . furosemide  40 mg Intravenous Q12H  . hydrocortisone   Rectal BID  . insulin aspart  0-20 Units Subcutaneous TID WC  . ipratropium-albuterol  3 mL Nebulization Q4H  . levofloxacin  500 mg Oral Daily  . levothyroxine  200 mcg Oral QAC breakfast  . methylPREDNISolone (SOLU-MEDROL) injection  40 mg Intravenous Q6H  . nystatin cream   Topical BID  . pantoprazole  40 mg Oral BID AC  . PARoxetine  20 mg Oral Daily  . simvastatin  40 mg Oral q morning - 10a  Continuous: . sodium chloride 10 mL/hr at 01/13/14 0400  . sodium chloride     DOD:[id number], benzonatate, clonazePAM, HYDROcodone-acetaminophen, ondansetron (ZOFRAN) IV, ondansetron, sodium chloride  Assesment: She was admitted with anemia. She developed congestive heart failure probably from fluid resuscitation and blood products. She then developed acute on chronic respiratory failure requiring BiPAP. She has improved. She has no other new complaints. She says she feels much better Active Problems:   Anemia    Plan: Continue with current treatments. No changes today.    LOS: 8 days   Bright Spielmann L 01/14/2014, 8:57 AM

## 2014-01-14 NOTE — Progress Notes (Signed)
Subjective: Patient is alert and awake. Her breathing has improved. No fever or chills. Objective: Vital signs in last 24 hours: Temp:  [98.3 F (36.8 C)-98.9 F (37.2 C)] 98.3 F (36.8 C) (08/02 0744) Pulse Rate:  [100-106] 101 (08/02 0800) Resp:  [14-91] 15 (08/02 0800) BP: (59-123)/(26-73) 94/54 mmHg (08/02 0800) SpO2:  [90 %-100 %] 100 % (08/02 0809) FiO2 (%):  [35 %] 35 % (08/01 2335) Weight:  [164.2 kg (361 lb 15.9 oz)] 164.2 kg (361 lb 15.9 oz) (08/02 0500) Weight change: -0.1 kg (-3.5 oz) Last BM Date: 01/06/14  Intake/Output from previous day: 08/01 0701 - 08/02 0700 In: 1600 [P.O.:1500; IV Piggyback:100] Out: 200 [Urine:200]  PHYSICAL EXAM General appearance: alert and no distress Resp: diminished breath sounds bilaterally and rhonchi bilaterally Cardio: S1, S2 normal GI: soft, non-tender; bowel sounds normal; no masses,  no organomegaly Extremities: edema 2++  Lab Results:  Results for orders placed during the hospital encounter of 01/06/14 (from the past 48 hour(s))  GLUCOSE, CAPILLARY     Status: None   Collection Time    01/12/14 11:07 AM      Result Value Ref Range   Glucose-Capillary 94  70 - 99 mg/dL   Comment 1 Documented in Chart     Comment 2 Notify RN    GLUCOSE, CAPILLARY     Status: Abnormal   Collection Time    01/12/14  4:02 PM      Result Value Ref Range   Glucose-Capillary 199 (*) 70 - 99 mg/dL   Comment 1 Documented in Chart     Comment 2 Notify RN    GLUCOSE, CAPILLARY     Status: Abnormal   Collection Time    01/12/14  8:50 PM      Result Value Ref Range   Glucose-Capillary 146 (*) 70 - 99 mg/dL   Comment 1 Notify RN    CBC     Status: Abnormal   Collection Time    01/13/14  4:38 AM      Result Value Ref Range   WBC 5.5  4.0 - 10.5 K/uL   RBC 2.83 (*) 3.87 - 5.11 MIL/uL   Hemoglobin 8.6 (*) 12.0 - 15.0 g/dL   HCT 30.2 (*) 36.0 - 46.0 %   MCV 106.7 (*) 78.0 - 100.0 fL   MCH 30.4  26.0 - 34.0 pg   MCHC 28.5 (*) 30.0 - 36.0 g/dL    RDW 22.3 (*) 11.5 - 15.5 %   Platelets 119 (*) 150 - 400 K/uL   Comment: SPECIMEN CHECKED FOR CLOTS     PLATELET COUNT CONFIRMED BY SMEAR  BASIC METABOLIC PANEL     Status: Abnormal   Collection Time    01/13/14  4:38 AM      Result Value Ref Range   Sodium 143  137 - 147 mEq/L   Potassium 5.1  3.7 - 5.3 mEq/L   Chloride 98  96 - 112 mEq/L   CO2 39 (*) 19 - 32 mEq/L   Glucose, Bld 147 (*) 70 - 99 mg/dL   BUN 41 (*) 6 - 23 mg/dL   Creatinine, Ser 1.61 (*) 0.50 - 1.10 mg/dL   Calcium 9.3  8.4 - 10.5 mg/dL   GFR calc non Af Amer 35 (*) >90 mL/min   GFR calc Af Amer 40 (*) >90 mL/min   Comment: (NOTE)     The eGFR has been calculated using the CKD EPI equation.     This  calculation has not been validated in all clinical situations.     eGFR's persistently <90 mL/min signify possible Chronic Kidney     Disease.   Anion gap 6  5 - 15  BLOOD GAS, ARTERIAL     Status: Abnormal   Collection Time    01/13/14  5:26 AM      Result Value Ref Range   FIO2 0.35     Delivery systems BILEVEL POSITIVE AIRWAY PRESSURE     Mode OXYGEN MASK     Rate 11     Inspiratory PAP 16     Expiratory PAP 6.0     pH, Arterial 7.297 (*) 7.350 - 7.450   pCO2 arterial 79.9 (*) 35.0 - 45.0 mmHg   Comment: CRITICAL RESULT CALLED TO, READ BACK BY AND VERIFIED WITH:      TO BREWER D RN AT 4010 01/13/2014 BY [facility] RRT   pO2, Arterial 74.2 (*) 80.0 - 100.0 mmHg   Bicarbonate 37.9 (*) 20.0 - 24.0 mEq/L   TCO2 36.7  0 - 100 mmol/L   Acid-Base Excess 11.3 (*) 0.0 - 2.0 mmol/L   O2 Saturation 95.1     Patient temperature 37.0     Collection site RADIAL     Drawn by [id number]     Sample type ARTERIAL     Allens test (pass/fail) PASS  PASS  GLUCOSE, CAPILLARY     Status: Abnormal   Collection Time    01/13/14  7:36 AM      Result Value Ref Range   Glucose-Capillary 139 (*) 70 - 99 mg/dL   Comment 1 Documented in Chart     Comment 2 Notify RN    GLUCOSE, CAPILLARY     Status: Abnormal   Collection Time     01/13/14 12:08 PM      Result Value Ref Range   Glucose-Capillary 156 (*) 70 - 99 mg/dL   Comment 1 Documented in Chart     Comment 2 Notify RN    GLUCOSE, CAPILLARY     Status: Abnormal   Collection Time    01/13/14  4:11 PM      Result Value Ref Range   Glucose-Capillary 172 (*) 70 - 99 mg/dL   Comment 1 Documented in Chart     Comment 2 Notify RN    GLUCOSE, CAPILLARY     Status: Abnormal   Collection Time    01/13/14  9:49 PM      Result Value Ref Range   Glucose-Capillary 131 (*) 70 - 99 mg/dL  CBC WITH DIFFERENTIAL     Status: Abnormal   Collection Time    01/14/14  4:13 AM      Result Value Ref Range   WBC 7.4  4.0 - 10.5 K/uL   RBC 2.72 (*) 3.87 - 5.11 MIL/uL   Hemoglobin 8.2 (*) 12.0 - 15.0 g/dL   HCT 27.6 (*) 36.0 - 46.0 %   MCV 101.5 (*) 78.0 - 100.0 fL   MCH 30.1  26.0 - 34.0 pg   MCHC 29.7 (*) 30.0 - 36.0 g/dL   RDW 22.7 (*) 11.5 - 15.5 %   Platelets 126 (*) 150 - 400 K/uL   Neutrophils Relative % 94 (*) 43 - 77 %   Lymphocytes Relative 3 (*) 12 - 46 %   Monocytes Relative 3  3 - 12 %   Eosinophils Relative 0  0 - 5 %   Basophils Relative 0  0 - 1 %  Neutro Abs 7.0  1.7 - 7.7 K/uL   Lymphs Abs 0.2 (*) 0.7 - 4.0 K/uL   Monocytes Absolute 0.2  0.1 - 1.0 K/uL   Eosinophils Absolute 0.0  0.0 - 0.7 K/uL   Basophils Absolute 0.0  0.0 - 0.1 K/uL   RBC Morphology TARGET CELLS     Comment: BASOPHILIC STIPPLING     POLYCHROMASIA PRESENT     ANISOCYTES  COMPREHENSIVE METABOLIC PANEL     Status: Abnormal   Collection Time    01/14/14  4:13 AM      Result Value Ref Range   Sodium 140  137 - 147 mEq/L   Potassium 5.3  3.7 - 5.3 mEq/L   Chloride 95 (*) 96 - 112 mEq/L   CO2 39 (*) 19 - 32 mEq/L   Glucose, Bld 128 (*) 70 - 99 mg/dL   BUN 53 (*) 6 - 23 mg/dL   Creatinine, Ser 2.15 (*) 0.50 - 1.10 mg/dL   Calcium 9.1  8.4 - 10.5 mg/dL   Total Protein 6.4  6.0 - 8.3 g/dL   Albumin 2.6 (*) 3.5 - 5.2 g/dL   AST 32  0 - 37 U/L   ALT 13  0 - 35 U/L   Alkaline  Phosphatase 118 (*) 39 - 117 U/L   Total Bilirubin 1.9 (*) 0.3 - 1.2 mg/dL   GFR calc non Af Amer 24 (*) >90 mL/min   GFR calc Af Amer 28 (*) >90 mL/min   Comment: (NOTE)     The eGFR has been calculated using the CKD EPI equation.     This calculation has not been validated in all clinical situations.     eGFR's persistently <90 mL/min signify possible Chronic Kidney     Disease.   Anion gap 6  5 - 15  GLUCOSE, CAPILLARY     Status: Abnormal   Collection Time    01/14/14  7:29 AM      Result Value Ref Range   Glucose-Capillary 129 (*) 70 - 99 mg/dL    ABGS  Recent Labs  01/13/14 0526  PHART 7.297*  PO2ART 74.2*  TCO2 36.7  HCO3 37.9*   CULTURES Recent Results (from the past 240 hour(s))  URINE CULTURE     Status: None   Collection Time    01/06/14  2:02 PM      Result Value Ref Range Status   Specimen Description URINE, CATHETERIZED   Final   Special Requests NONE   Final   Culture  Setup Time     Final   Value: 01/06/2014 21:21     Performed at [place]     Final   Value: >=100,000 COLONIES/ML     Performed at [facility]   Culture     Final   Value: KLEBSIELLA PNEUMONIAE     Performed at [facility]   Report Status 01/08/2014 FINAL   Final   Organism ID, Bacteria KLEBSIELLA PNEUMONIAE   Final  MRSA PCR SCREENING     Status: Abnormal   Collection Time    01/06/14  6:25 PM      Result Value Ref Range Status   MRSA by PCR POSITIVE (*) NEGATIVE Final   Comment:            The GeneXpert MRSA Assay (FDA     approved for NASAL specimens     only), is one component of a  comprehensive MRSA colonization     surveillance program. It is not     intended to diagnose MRSA     infection nor to guide or     monitor treatment for     MRSA infections.     CRITICAL RESULT CALLED TO, READ BACK BY AND VERIFIED WITH:     M.GRAY AT 2110 ON 01/06/14 BY S.VANHOORNE   Studies/Results: No results found.  Medications: I have  reviewed the patient's current medications.  Assesment: Active Problems:   Anemia Pulmonary edema  UTI  Diabetes mellitus  History of hypothyroidism  COPD Acute renal failure   Plan: Medications reviewed Will decrease lasix to 40 mg iv daily As per pulmonary recommendation Will monitor CBC/BMP     LOS: 8 days   [name] Green 01/14/2014, 9:04 AM

## 2014-01-14 NOTE — Progress Notes (Signed)
Dr. Felecia ShellingFanta asked me to schedule patient for EGD. As far as her breathing is concerned she is back to baseline. Patient's hemoglobin was 9 g 2 days ago and today is 8.2 g The patient is alert and in no acute distress. She wants to proceed with the EGD which will be scheduled with Dr. Darrick PennaFields in am. She will also have CBC in a.m.

## 2014-01-15 ENCOUNTER — Encounter (HOSPITAL_COMMUNITY): Payer: Self-pay | Admitting: *Deleted

## 2014-01-15 ENCOUNTER — Encounter (HOSPITAL_COMMUNITY): Payer: Medicaid Other

## 2014-01-15 ENCOUNTER — Encounter (HOSPITAL_COMMUNITY)
Admission: EM | Disposition: A | Discharge status: Home Health | Payer: Self-pay | Source: Home / Self Care | Attending: Family Medicine

## 2014-01-15 ENCOUNTER — Inpatient Hospital Stay (HOSPITAL_COMMUNITY): Payer: Medicaid Other

## 2014-01-15 DIAGNOSIS — K3182 Dieulafoy lesion (hemorrhagic) of stomach and duodenum: Secondary | ICD-10-CM

## 2014-01-15 DIAGNOSIS — K2990 Gastroduodenitis, unspecified, without bleeding: Secondary | ICD-10-CM

## 2014-01-15 DIAGNOSIS — K2970 Gastritis, unspecified, without bleeding: Secondary | ICD-10-CM

## 2014-01-15 DIAGNOSIS — D649 Anemia, unspecified: Secondary | ICD-10-CM

## 2014-01-15 HISTORY — PX: ESOPHAGOGASTRODUODENOSCOPY: SHX5428

## 2014-01-15 LAB — GLUCOSE, CAPILLARY
GLUCOSE-CAPILLARY: 120 mg/dL — AB (ref 70–99)
GLUCOSE-CAPILLARY: 138 mg/dL — AB (ref 70–99)
GLUCOSE-CAPILLARY: 149 mg/dL — AB (ref 70–99)
Glucose-Capillary: 160 mg/dL — ABNORMAL HIGH (ref 70–99)

## 2014-01-15 LAB — CBC
HCT: 27.2 % — ABNORMAL LOW (ref 36.0–46.0)
Hemoglobin: 8.2 g/dL — ABNORMAL LOW (ref 12.0–15.0)
MCH: 30.1 pg (ref 26.0–34.0)
MCHC: 30.1 g/dL (ref 30.0–36.0)
MCV: 100 fL (ref 78.0–100.0)
PLATELETS: 103 10*3/uL — AB (ref 150–400)
RBC: 2.72 MIL/uL — ABNORMAL LOW (ref 3.87–5.11)
RDW: 22.6 % — AB (ref 11.5–15.5)
WBC: 5.5 10*3/uL (ref 4.0–10.5)

## 2014-01-15 LAB — BASIC METABOLIC PANEL
ANION GAP: 6 (ref 5–15)
BUN: 66 mg/dL — ABNORMAL HIGH (ref 6–23)
CO2: 38 mEq/L — ABNORMAL HIGH (ref 19–32)
Calcium: 9.1 mg/dL (ref 8.4–10.5)
Chloride: 96 mEq/L (ref 96–112)
Creatinine, Ser: 2.42 mg/dL — ABNORMAL HIGH (ref 0.50–1.10)
GFR calc Af Amer: 25 mL/min — ABNORMAL LOW (ref >90)
GFR, EST NON AFRICAN AMERICAN: 21 mL/min — AB (ref >90)
Glucose, Bld: 155 mg/dL — ABNORMAL HIGH (ref 70–99)
POTASSIUM: 5.4 meq/L — AB (ref 3.7–5.3)
SODIUM: 140 meq/L (ref 137–147)

## 2014-01-15 SURGERY — EGD (ESOPHAGOGASTRODUODENOSCOPY)
Anesthesia: Moderate Sedation

## 2014-01-15 MED ORDER — MIDAZOLAM HCL 5 MG/5ML IJ SOLN
INTRAMUSCULAR | Status: AC
  Filled 2014-01-15: qty 10

## 2014-01-15 MED ORDER — HYDROCORTISONE 2.5 % RE CREA
TOPICAL_CREAM | RECTAL | Status: AC
  Filled 2014-01-15: qty 28.35

## 2014-01-15 MED ORDER — IPRATROPIUM-ALBUTEROL 0.5-2.5 (3) MG/3ML IN SOLN
3.0000 mL | Freq: Four times a day (QID) | RESPIRATORY_TRACT | Status: DC
  Administered 2014-01-16 – 2014-01-19 (×13): 3 mL via RESPIRATORY_TRACT
  Filled 2014-01-15 (×13): qty 3

## 2014-01-15 MED ORDER — PROMETHAZINE HCL 25 MG/ML IJ SOLN
INTRAMUSCULAR | Status: AC
  Filled 2014-01-15: qty 1

## 2014-01-15 MED ORDER — SODIUM CHLORIDE 0.9 % IJ SOLN
INTRAMUSCULAR | Status: AC
  Administered 2014-01-15: 10 mL
  Filled 2014-01-15: qty 10

## 2014-01-15 MED ORDER — NYSTATIN 100000 UNIT/GM EX CREA
TOPICAL_CREAM | CUTANEOUS | Status: AC
  Filled 2014-01-15: qty 15

## 2014-01-15 MED ORDER — FENTANYL CITRATE 0.05 MG/ML IJ SOLN
INTRAMUSCULAR | Status: AC
Start: 2014-01-15 — End: 2014-01-15
  Filled 2014-01-15: qty 2

## 2014-01-15 MED ORDER — LIDOCAINE VISCOUS 2 % MT SOLN
OROMUCOSAL | Status: AC
  Filled 2014-01-15: qty 15

## 2014-01-15 MED ORDER — MEPERIDINE HCL 100 MG/ML IJ SOLN
INTRAMUSCULAR | Status: AC
  Filled 2014-01-15: qty 2

## 2014-01-15 MED ORDER — LIDOCAINE VISCOUS 2 % MT SOLN
OROMUCOSAL | Status: DC | PRN
  Administered 2014-01-15: 3 mL via OROMUCOSAL

## 2014-01-15 MED ORDER — MIDAZOLAM HCL 5 MG/5ML IJ SOLN
INTRAMUSCULAR | Status: DC | PRN
  Administered 2014-01-15 (×2): 2 mg via INTRAVENOUS
  Administered 2014-01-15: 1 mg via INTRAVENOUS

## 2014-01-15 MED ORDER — FENTANYL CITRATE 0.05 MG/ML IJ SOLN
INTRAMUSCULAR | Status: DC | PRN
  Administered 2014-01-15 (×3): 25 ug via INTRAVENOUS

## 2014-01-15 NOTE — Progress Notes (Signed)
Pt a/o.vss. Saline lock patent. O2 on 3L/[place]. Denies any distress. Report called to S.Vilma PraderHeath, Charity fundraiserN. Pt to be transferred via bed to room 332.

## 2014-01-15 NOTE — H&P (View-Only) (Signed)
Referring Provider: No ref. provider found Primary Care Physician:  Avon Gully, MD Primary Gastroenterologist:  Jonette Eva  Reason for Consultation:  *ANEMIA  Impression: ADMITTED WITH CHF EXACERBATION/CHRONIC ANEMIA DUE TO ?B12 DEFICIENCY/CHRONIC DISEASE/OCCULT GI BLOOD LOSS. CURRENTLY NOT A CANDIDATE FOR EGD UNTIL CARDIOPULMONARY STATUS IMPROVES. PT AGREES TO EGD BUT WOULD PREFER TO WAIT FOR TCS. ANEMIA MOST LIKELY DUE TO CHRONIC DISEASE, BUT COULD HAVE H PYLORI GASTRITIS, LESS LIKELY EROSIVE GASTRITIS, STRESS ULCER, OR PUD. NO SIGNS OR SYMPTOMS OF ACTIVE GI BLEED.   Plan: 1. CONTINUE TO MONITOR CHF SYMPTOMS FOR IMPROVEMENT 2. EGD PRIOR TO DISCHARGE WITH CPAP. 3. DISCUSSED PROCEDURE(EGD/TCS), BENEFITS, AND RISKS. 4. BID PPI 5. MAY CONTINUE ASA FOR NOW.   HPI:  PT CAME TO ED WITH WORSENING SOB. CXR SHOWS CHF. ALSO NOTED TO HAVE A DROP IN HER Hb FROM 8.9 TO 6.1. SUBJECTIVE CHILLS. OCCASIONAL NAUSEA/VOMITING. UNINTENTIONAL WEIGHT LOSS 516 LBS TO 358 BS SINCE 2014.  PT DENIES FEVER, CHILLS, HEMATOCHEZIA, melena, diarrhea, CHEST PAIN, constipation, abdominal pain, problems swallowing, OR heartburn or indigestion.  Past Medical History  Diagnosis Date  . COPD (chronic obstructive pulmonary disease)   . Diabetes mellitus without complication   . Hypertension   . Thyroid disease   . Hyperlipemia   . Neuropathy   . Depression   . Chronic pain   . Anxiety   . CHF (congestive heart failure)   . Hemorrhoids   . Chronic cough   . Peripheral edema   . On home O2     2L N/C    Past Surgical History  Procedure Laterality Date  . Cholecystectomy      Prior to Admission medications   Medication Sig Start Date End Date Taking? Authorizing Provider  albuterol (PROVENTIL) (2.5 MG/3ML) 0.083% nebulizer solution Take 3 mLs (2.5 mg total) by nebulization every 4 (four) hours as needed for wheezing or shortness of breath. 12/14/13  Yes Fredirick Maudlin, MD  aspirin EC 81 MG EC tablet  Take 1 tablet (81 mg total) by mouth daily. 12/14/13  Yes Fredirick Maudlin, MD  clonazePAM (KLONOPIN) 1 MG tablet Take 0.5 tablets (0.5 mg total) by mouth 4 (four) times daily as needed for anxiety. 12/14/13  Yes Fredirick Maudlin, MD  DULoxetine (CYMBALTA) 20 MG capsule Take 1 capsule (20 mg total) by mouth daily. 01/05/13  Yes Gwenyth Bender, NP  folic acid (FOLVITE) 1 MG tablet Take 1 tablet (1 mg total) by mouth daily. 12/14/13  Yes Fredirick Maudlin, MD  furosemide (LASIX) 40 MG tablet Take 1 tablet (40 mg total) by mouth daily. 01/05/13  Yes Gwenyth Bender, NP  HYDROcodone-acetaminophen (NORCO/VICODIN) 5-325 MG per tablet Take 1 tablet by mouth every 4 (four) hours as needed for moderate pain.   Yes Historical Provider, MD  insulin lispro (HUMALOG) 100 UNIT/ML injection Inject 2-10 Units into the skin 3 (three) times daily before meals. SS   Yes Historical Provider, MD  levothyroxine (SYNTHROID, LEVOTHROID) 200 MCG tablet Take 200 mcg by mouth daily before breakfast.   Yes Historical Provider, MD  lisinopril (PRINIVIL,ZESTRIL) 5 MG tablet Take 1 tablet (5 mg total) by mouth daily. 01/05/13  Yes Lesle Chris Black, NP  PARoxetine (PAXIL) 20 MG tablet Take 20 mg by mouth every morning.   Yes Historical Provider, MD  potassium chloride SA (K-DUR,KLOR-CON) 20 MEQ tablet Take 40 mEq by mouth daily.   Yes Historical Provider, MD  promethazine (PHENERGAN) 25 MG tablet Take 25 mg by mouth every  6 (six) hours as needed for nausea or vomiting.   Yes Historical Provider, MD  simvastatin (ZOCOR) 40 MG tablet Take 40 mg by mouth every morning.    Yes Historical Provider, MD  temazepam (RESTORIL) 15 MG capsule Take 15 mg by mouth at bedtime as needed for sleep.   Yes Historical Provider, MD    Current Facility-Administered Medications  Medication Dose Route Frequency Provider Last Rate Last Dose  . 0.9 %  sodium chloride infusion   Intravenous Continuous Meredeth Ide, MD 10 mL/hr at 01/07/14 0400    . albuterol (PROVENTIL)  (2.5 MG/3ML) 0.083% nebulizer solution 2.5 mg  2.5 mg Nebulization Q4H PRN Meredeth Ide, MD      . antiseptic oral rinse (BIOTENE) solution 15 mL  15 mL Mouth Rinse q12n4p Meredeth Ide, MD      . aspirin EC tablet 81 mg  81 mg Oral Daily Meredeth Ide, MD   81 mg at 01/07/14 0859  . chlorhexidine (PERIDEX) 0.12 % solution 15 mL  15 mL Mouth Rinse BID Meredeth Ide, MD   15 mL at 01/07/14 0807  . Chlorhexidine Gluconate Cloth 2 % PADS 6 each  6 each Topical Q0600 Meredeth Ide, MD   6 each at 01/07/14 [phone]  . ciprofloxacin (CIPRO) IVPB 400 mg  400 mg Intravenous Q12H Meredeth Ide, MD   400 mg at 01/07/14 9604  . clonazePAM (KLONOPIN) tablet 0.5 mg  0.5 mg Oral TID PRN Meredeth Ide, MD      . DULoxetine (CYMBALTA) DR capsule 20 mg  20 mg Oral Daily Meredeth Ide, MD      . folic acid (FOLVITE) tablet 1 mg  1 mg Oral Daily Meredeth Ide, MD   1 mg at 01/07/14 0900  . furosemide (LASIX) injection 20 mg  20 mg Intravenous Q12H Meredeth Ide, MD   20 mg at 01/07/14 5409  . HYDROcodone-acetaminophen (NORCO/VICODIN) 5-325 MG per tablet 1 tablet  1 tablet Oral Q4H PRN Meredeth Ide, MD      . insulin aspart (novoLOG) injection 0-20 Units  0-20 Units Subcutaneous TID WC Meredeth Ide, MD   3 Units at 01/07/14 [phone]  . levothyroxine (SYNTHROID, LEVOTHROID) tablet 200 mcg  200 mcg Oral QAC breakfast Meredeth Ide, MD   200 mcg at 01/07/14 0806  . mupirocin ointment (BACTROBAN) 2 % 1 application  1 application Nasal BID Meredeth Ide, MD   1 application at 01/07/14 1000  . nystatin (MYCOSTATIN/NYSTOP) topical powder   Topical BID Jinger Neighbors, NP      . ondansetron (ZOFRAN) tablet 4 mg  4 mg Oral Q6H PRN Meredeth Ide, MD       Or  . ondansetron (ZOFRAN) injection 4 mg  4 mg Intravenous Q6H PRN Meredeth Ide, MD      . PARoxetine (PAXIL) tablet 20 mg  20 mg Oral Daily Meredeth Ide, MD   20 mg at 01/07/14 0900  . potassium chloride SA (K-DUR,KLOR-CON) CR tablet 40 mEq  40 mEq Oral Daily Meredeth Ide, MD   40 mEq at 01/07/14  0859  . simvastatin (ZOCOR) tablet 40 mg  40 mg Oral q morning - 10a Meredeth Ide, MD   40 mg at 01/06/14 2019  . temazepam (RESTORIL) capsule 15 mg  15 mg Oral QHS PRN Meredeth Ide, MD        Allergies as of 01/06/2014 -  Review Complete 01/06/2014  Allergen Reaction Noted  . Codeine Nausea And Vomiting 01/03/2013  . Keflex [cephalexin] Hives and Nausea And Vomiting 01/03/2013  . Penicillins Nausea And Vomiting 01/03/2013    FAMILY HISTORY: NO COLON CA OR POLYPS   History   Social History  . Marital Status: Divorced    Spouse Name: N/A    Number of Children: N/A  . Years of Education: N/A   Occupational History  . Not on file.   Social History Main Topics  . Smoking status: Former Games developermoker  . Smokeless tobacco: Not on file  . Alcohol Use: No  . Drug Use: No  . Sexual Activity: Not on file   Review of Systems: PER HPI OTHERWISE ALL SYSTEMS ARE NEGATIVE.   Vitals: Blood pressure 108/63, pulse 100, temperature 96.4 F (35.8 C), temperature source Axillary, resp. rate 15, height 5\' 5"  (1.651 m), weight 358 lb 4 oz (162.5 kg), SpO2 95.00%.  Physical Exam: General:   Alert,  pleasant and cooperative in UNABLE TO COMPLETE FULL SENTENCES Head:  Normocephalic and atraumatic. Eyes:  Sclera clear, no icterus.   Conjunctiva pink. Mouth:  No lesions, dentition ABnormal. Neck:  Supple; no masses. Lungs:  Clear throughout to auscultation IN ANTERIOR LUNG Jazelle Achey, DECREASED BREATH SOUNDS BILATERALLY IN BASES.   No wheezes. No acute distress. Heart:  Regular rate and rhythm; no murmurs. Abdomen:  Soft, MILD TTP IN THE EPIGASTRIUM  Non-distended, OBESE, No masses noted. Normal bowel sounds, without guarding, and without rebound.   Msk:  Symmetrical   Extremities:  With edema. Neurologic:  Alert and  oriented x4;  grossly normal neurologically. Cervical Nodes:  No significant cervical adenopathy. Psych:  Alert and cooperative. Normal mood and FLAT affect.   Lab Results:  Recent  Labs  01/06/14 1318 01/07/14 0200  WBC 9.8 8.7  HGB 6.1* 7.8*  HCT 20.9* 25.2*  PLT 178 168   BMET  Recent Labs  01/06/14 1318 01/07/14 0200  NA 141 141  K 4.2 4.3  CL 97 97  CO2 37* 36*  GLUCOSE 122* 108*  BUN 19 20  CREATININE 1.08 1.05  CALCIUM 8.6 8.4   LFT  Recent Labs  01/07/14 0200  PROT 6.1  ALBUMIN 2.4*  AST 32  ALT 11  ALKPHOS 138*  BILITOT 4.1*     Studies/Results: CXR: BIL PLEURAL EFFUSIONS/ PULMONARY EDEMA   LOS: 1 day   Iliani Vejar  01/07/2014, 9:00 AM

## 2014-01-15 NOTE — Progress Notes (Signed)
Subjective: She says she feels better. Her breathing is better. She has been confused and according to family is "talking out of her head" her renal function is not as good  Objective: Vital signs in last 24 hours: Temp:  [97.7 F (36.5 C)-98.4 F (36.9 C)] 97.7 F (36.5 C) (08/03 0818) Pulse Rate:  [93-106] 93 (08/03 0600) Resp:  [9-18] 13 (08/03 0818) BP: (72-108)/(39-63) 108/52 mmHg (08/03 0818) SpO2:  [93 %-99 %] 97 % (08/03 0818) FiO2 (%):  [35 %] 35 % (08/03 0342) Weight:  [163.749 kg (361 lb)] 163.749 kg (361 lb) (08/03 0818) Weight change:  Last BM Date: 01/06/14  Intake/Output from previous day: 08/02 0701 - 08/03 0700 In: 1379.2 [P.O.:1180; I.V.:199.2] Out: 805 [Urine:805]  PHYSICAL EXAM General appearance: alert, cooperative, mild distress and morbidly obese Resp: clear to auscultation bilaterally Cardio: regular rate and rhythm, S1, S2 normal, no murmur, click, rub or gallop GI: Her abdomen is soft obese without masses Extremities: She still has edema of her legs but I think it has improved  Lab Results:  Results for orders placed during the hospital encounter of 01/06/14 (from the past 48 hour(s))  GLUCOSE, CAPILLARY     Status: Abnormal   Collection Time    01/13/14 12:08 PM      Result Value Ref Range   Glucose-Capillary 156 (*) 70 - 99 mg/dL   Comment 1 Documented in Chart     Comment 2 Notify RN    GLUCOSE, CAPILLARY     Status: Abnormal   Collection Time    01/13/14  4:11 PM      Result Value Ref Range   Glucose-Capillary 172 (*) 70 - 99 mg/dL   Comment 1 Documented in Chart     Comment 2 Notify RN    GLUCOSE, CAPILLARY     Status: Abnormal   Collection Time    01/13/14  9:49 PM      Result Value Ref Range   Glucose-Capillary 131 (*) 70 - 99 mg/dL  CBC WITH DIFFERENTIAL     Status: Abnormal   Collection Time    01/14/14  4:13 AM      Result Value Ref Range   WBC 7.4  4.0 - 10.5 K/uL   RBC 2.72 (*) 3.87 - 5.11 MIL/uL   Hemoglobin 8.2 (*) 12.0  - 15.0 g/dL   HCT 27.6 (*) 36.0 - 46.0 %   MCV 101.5 (*) 78.0 - 100.0 fL   MCH 30.1  26.0 - 34.0 pg   MCHC 29.7 (*) 30.0 - 36.0 g/dL   RDW 22.7 (*) 11.5 - 15.5 %   Platelets 126 (*) 150 - 400 K/uL   Neutrophils Relative % 94 (*) 43 - 77 %   Lymphocytes Relative 3 (*) 12 - 46 %   Monocytes Relative 3  3 - 12 %   Eosinophils Relative 0  0 - 5 %   Basophils Relative 0  0 - 1 %   Neutro Abs 7.0  1.7 - 7.7 K/uL   Lymphs Abs 0.2 (*) 0.7 - 4.0 K/uL   Monocytes Absolute 0.2  0.1 - 1.0 K/uL   Eosinophils Absolute 0.0  0.0 - 0.7 K/uL   Basophils Absolute 0.0  0.0 - 0.1 K/uL   RBC Morphology TARGET CELLS     Comment: BASOPHILIC STIPPLING     POLYCHROMASIA PRESENT     ANISOCYTES  COMPREHENSIVE METABOLIC PANEL     Status: Abnormal   Collection Time  01/14/14  4:13 AM      Result Value Ref Range   Sodium 140  137 - 147 mEq/L   Potassium 5.3  3.7 - 5.3 mEq/L   Chloride 95 (*) 96 - 112 mEq/L   CO2 39 (*) 19 - 32 mEq/L   Glucose, Bld 128 (*) 70 - 99 mg/dL   BUN 53 (*) 6 - 23 mg/dL   Creatinine, Ser 2.15 (*) 0.50 - 1.10 mg/dL   Calcium 9.1  8.4 - 10.5 mg/dL   Total Protein 6.4  6.0 - 8.3 g/dL   Albumin 2.6 (*) 3.5 - 5.2 g/dL   AST 32  0 - 37 U/L   ALT 13  0 - 35 U/L   Alkaline Phosphatase 118 (*) 39 - 117 U/L   Total Bilirubin 1.9 (*) 0.3 - 1.2 mg/dL   GFR calc non Af Amer 24 (*) >90 mL/min   GFR calc Af Amer 28 (*) >90 mL/min   Comment: (NOTE)     The eGFR has been calculated using the CKD EPI equation.     This calculation has not been validated in all clinical situations.     eGFR's persistently <90 mL/min signify possible Chronic Kidney     Disease.   Anion gap 6  5 - 15  GLUCOSE, CAPILLARY     Status: Abnormal   Collection Time    01/14/14  7:29 AM      Result Value Ref Range   Glucose-Capillary 129 (*) 70 - 99 mg/dL  GLUCOSE, CAPILLARY     Status: Abnormal   Collection Time    01/14/14 10:54 AM      Result Value Ref Range   Glucose-Capillary 161 (*) 70 - 99 mg/dL   GLUCOSE, CAPILLARY     Status: Abnormal   Collection Time    01/14/14  4:12 PM      Result Value Ref Range   Glucose-Capillary 143 (*) 70 - 99 mg/dL  CBC     Status: Abnormal   Collection Time    01/15/14  4:07 AM      Result Value Ref Range   WBC 5.5  4.0 - 10.5 K/uL   RBC 2.72 (*) 3.87 - 5.11 MIL/uL   Hemoglobin 8.2 (*) 12.0 - 15.0 g/dL   HCT 27.2 (*) 36.0 - 46.0 %   MCV 100.0  78.0 - 100.0 fL   MCH 30.1  26.0 - 34.0 pg   MCHC 30.1  30.0 - 36.0 g/dL   RDW 22.6 (*) 11.5 - 15.5 %   Platelets 103 (*) 150 - 400 K/uL   Comment: SPECIMEN CHECKED FOR CLOTS     CONSISTENT WITH PREVIOUS RESULT  BASIC METABOLIC PANEL     Status: Abnormal   Collection Time    01/15/14  4:07 AM      Result Value Ref Range   Sodium 140  137 - 147 mEq/L   Potassium 5.4 (*) 3.7 - 5.3 mEq/L   Chloride 96  96 - 112 mEq/L   CO2 38 (*) 19 - 32 mEq/L   Glucose, Bld 155 (*) 70 - 99 mg/dL   BUN 66 (*) 6 - 23 mg/dL   Creatinine, Ser 2.42 (*) 0.50 - 1.10 mg/dL   Calcium 9.1  8.4 - 10.5 mg/dL   GFR calc non Af Amer 21 (*) >90 mL/min   GFR calc Af Amer 25 (*) >90 mL/min   Comment: (NOTE)     The eGFR has been calculated using  the CKD EPI equation.     This calculation has not been validated in all clinical situations.     eGFR's persistently <90 mL/min signify possible Chronic Kidney     Disease.   Anion gap 6  5 - 15    ABGS  Recent Labs  01/13/14 0526  PHART 7.297*  PO2ART 74.2*  TCO2 36.7  HCO3 37.9*   CULTURES Recent Results (from the past 240 hour(s))  URINE CULTURE     Status: None   Collection Time    01/06/14  2:02 PM      Result Value Ref Range Status   Specimen Description URINE, CATHETERIZED   Final   Special Requests NONE   Final   Culture  Setup Time     Final   Value: 01/06/2014 21:21     Performed at [place]     Final   Value: >=100,000 COLONIES/ML     Performed at [facility]   Culture     Final   Value: KLEBSIELLA PNEUMONIAE      Performed at [facility]   Report Status 01/08/2014 FINAL   Final   Organism ID, Bacteria KLEBSIELLA PNEUMONIAE   Final  MRSA PCR SCREENING     Status: Abnormal   Collection Time    01/06/14  6:25 PM      Result Value Ref Range Status   MRSA by PCR POSITIVE (*) NEGATIVE Final   Comment:            The GeneXpert MRSA Assay (FDA     approved for NASAL specimens     only), is one component of a     comprehensive MRSA colonization     surveillance program. It is not     intended to diagnose MRSA     infection nor to guide or     monitor treatment for     MRSA infections.     CRITICAL RESULT CALLED TO, READ BACK BY AND VERIFIED WITH:     M.GRAY AT 2110 ON 01/06/14 BY S.VANHOORNE   Studies/Results: No results found.  Medications:  Prior to Admission:  Prescriptions prior to admission  Medication Sig Dispense Refill  . albuterol (PROVENTIL) (2.5 MG/3ML) 0.083% nebulizer solution Take 3 mLs (2.5 mg total) by nebulization every 4 (four) hours as needed for wheezing or shortness of breath.  75 mL  12  . aspirin EC 81 MG EC tablet Take 1 tablet (81 mg total) by mouth daily.  30 tablet  12  . clonazePAM (KLONOPIN) 1 MG tablet Take 0.5 tablets (0.5 mg total) by mouth 4 (four) times daily as needed for anxiety.  30 tablet  0  . DULoxetine (CYMBALTA) 20 MG capsule Take 1 capsule (20 mg total) by mouth daily.  30 capsule  3  . folic acid (FOLVITE) 1 MG tablet Take 1 tablet (1 mg total) by mouth daily.  30 tablet  12  . furosemide (LASIX) 40 MG tablet Take 1 tablet (40 mg total) by mouth daily.  30 tablet  1  . HYDROcodone-acetaminophen (NORCO/VICODIN) 5-325 MG per tablet Take 1 tablet by mouth every 4 (four) hours as needed for moderate pain.      Marland Kitchen insulin lispro (HUMALOG) 100 UNIT/ML injection Inject 2-10 Units into the skin 3 (three) times daily before meals. SS      . levothyroxine (SYNTHROID, LEVOTHROID) 200 MCG tablet Take 200 mcg by mouth daily before breakfast.      .  lisinopril  (PRINIVIL,ZESTRIL) 5 MG tablet Take 1 tablet (5 mg total) by mouth daily.  30 tablet  1  . PARoxetine (PAXIL) 20 MG tablet Take 20 mg by mouth every morning.      . potassium chloride SA (K-DUR,KLOR-CON) 20 MEQ tablet Take 40 mEq by mouth daily.      . promethazine (PHENERGAN) 25 MG tablet Take 25 mg by mouth every 6 (six) hours as needed for nausea or vomiting.      . simvastatin (ZOCOR) 40 MG tablet Take 40 mg by mouth every morning.       . temazepam (RESTORIL) 15 MG capsule Take 15 mg by mouth at bedtime as needed for sleep.       Scheduled: . antiseptic oral rinse  15 mL Mouth Rinse BID  . antiseptic oral rinse  15 mL Mouth Rinse BID  . aspirin EC  81 mg Oral Daily  . DULoxetine  20 mg Oral Daily  . folic acid  1 mg Oral Daily  . hydrocortisone   Rectal BID  . insulin aspart  0-20 Units Subcutaneous TID WC  . ipratropium-albuterol  3 mL Nebulization Q4H  . levothyroxine  200 mcg Oral QAC breakfast  . meperidine      . methylPREDNISolone (SOLU-MEDROL) injection  40 mg Intravenous Q6H  . midazolam      . nystatin cream   Topical BID  . pantoprazole  40 mg Oral BID AC  . PARoxetine  20 mg Oral Daily  . simvastatin  40 mg Oral q morning - 10a   Continuous: . sodium chloride 10 mL/hr at 01/14/14 1900  . sodium chloride    . sodium chloride     EHM:CNOBSJGGE, benzonatate, clonazePAM, HYDROcodone-acetaminophen, ondansetron (ZOFRAN) IV, ondansetron, sodium chloride  Assesment: She was admitted with anemia requiring blood transfusion. She seemed to become volume overloaded required transfer to the step down unit and required BiPAP. She was poorly responsive for some period of time during that but is better now. She is being diuresed and her renal function has deteriorated somewhat. She is set for endoscopy this morning. She has multiple medical problems and her overall prognosis is poor Active Problems:   Anemia    Plan: Continue with current treatments. Endoscopy today.    LOS:  9 days   Carolie Mcilrath L 01/15/2014, 8:39 AM

## 2014-01-15 NOTE — Progress Notes (Signed)
Patient placed on bipap, seems to be tolerating well at this time, RT will continue to monitor.

## 2014-01-15 NOTE — Progress Notes (Signed)
Subjective: Patient feels better. She is more alert and awake. Her breathing has improved. However, renal function is deteriorating probably due to diuretics. Objective: Vital signs in last 24 hours: Temp:  [98.3 F (36.8 C)-98.4 F (36.9 C)] 98.4 F (36.9 C) (08/02 1600) Pulse Rate:  [93-106] 93 (08/03 0600) Resp:  [9-18] 9 (08/03 0600) BP: (72-108)/(39-63) 103/51 mmHg (08/03 0600) SpO2:  [93 %-100 %] 96 % (08/03 0600) FiO2 (%):  [35 %] 35 % (08/03 0342) Weight change:  Last BM Date: 01/06/14  Intake/Output from previous day: 08/02 0701 - 08/03 0700 In: 1379.2 [P.O.:1180; I.V.:199.2] Out: 805 [Urine:805]  PHYSICAL EXAM General appearance: alert and no distress Resp: diminished breath sounds bilaterally and rhonchi bilaterally Cardio: S1, S2 normal GI: soft, non-tender; bowel sounds normal; no masses,  no organomegaly Extremities: edema 2++  Lab Results:  Results for orders placed during the hospital encounter of 01/06/14 (from the past 48 hour(s))  GLUCOSE, CAPILLARY     Status: Abnormal   Collection Time    01/13/14 12:08 PM      Result Value Ref Range   Glucose-Capillary 156 (*) 70 - 99 mg/dL   Comment 1 Documented in Chart     Comment 2 Notify RN    GLUCOSE, CAPILLARY     Status: Abnormal   Collection Time    01/13/14  4:11 PM      Result Value Ref Range   Glucose-Capillary 172 (*) 70 - 99 mg/dL   Comment 1 Documented in Chart     Comment 2 Notify RN    GLUCOSE, CAPILLARY     Status: Abnormal   Collection Time    01/13/14  9:49 PM      Result Value Ref Range   Glucose-Capillary 131 (*) 70 - 99 mg/dL  CBC WITH DIFFERENTIAL     Status: Abnormal   Collection Time    01/14/14  4:13 AM      Result Value Ref Range   WBC 7.4  4.0 - 10.5 K/uL   RBC 2.72 (*) 3.87 - 5.11 MIL/uL   Hemoglobin 8.2 (*) 12.0 - 15.0 g/dL   HCT 27.6 (*) 36.0 - 46.0 %   MCV 101.5 (*) 78.0 - 100.0 fL   MCH 30.1  26.0 - 34.0 pg   MCHC 29.7 (*) 30.0 - 36.0 g/dL   RDW 22.7 (*) 11.5 - 15.5 %    Platelets 126 (*) 150 - 400 K/uL   Neutrophils Relative % 94 (*) 43 - 77 %   Lymphocytes Relative 3 (*) 12 - 46 %   Monocytes Relative 3  3 - 12 %   Eosinophils Relative 0  0 - 5 %   Basophils Relative 0  0 - 1 %   Neutro Abs 7.0  1.7 - 7.7 K/uL   Lymphs Abs 0.2 (*) 0.7 - 4.0 K/uL   Monocytes Absolute 0.2  0.1 - 1.0 K/uL   Eosinophils Absolute 0.0  0.0 - 0.7 K/uL   Basophils Absolute 0.0  0.0 - 0.1 K/uL   RBC Morphology TARGET CELLS     Comment: BASOPHILIC STIPPLING     POLYCHROMASIA PRESENT     ANISOCYTES  COMPREHENSIVE METABOLIC PANEL     Status: Abnormal   Collection Time    01/14/14  4:13 AM      Result Value Ref Range   Sodium 140  137 - 147 mEq/L   Potassium 5.3  3.7 - 5.3 mEq/L   Chloride 95 (*) 96 - 112  mEq/L   CO2 39 (*) 19 - 32 mEq/L   Glucose, Bld 128 (*) 70 - 99 mg/dL   BUN 53 (*) 6 - 23 mg/dL   Creatinine, Ser 2.15 (*) 0.50 - 1.10 mg/dL   Calcium 9.1  8.4 - 10.5 mg/dL   Total Protein 6.4  6.0 - 8.3 g/dL   Albumin 2.6 (*) 3.5 - 5.2 g/dL   AST 32  0 - 37 U/L   ALT 13  0 - 35 U/L   Alkaline Phosphatase 118 (*) 39 - 117 U/L   Total Bilirubin 1.9 (*) 0.3 - 1.2 mg/dL   GFR calc non Af Amer 24 (*) >90 mL/min   GFR calc Af Amer 28 (*) >90 mL/min   Comment: (NOTE)     The eGFR has been calculated using the CKD EPI equation.     This calculation has not been validated in all clinical situations.     eGFR's persistently <90 mL/min signify possible Chronic Kidney     Disease.   Anion gap 6  5 - 15  GLUCOSE, CAPILLARY     Status: Abnormal   Collection Time    01/14/14  7:29 AM      Result Value Ref Range   Glucose-Capillary 129 (*) 70 - 99 mg/dL  GLUCOSE, CAPILLARY     Status: Abnormal   Collection Time    01/14/14 10:54 AM      Result Value Ref Range   Glucose-Capillary 161 (*) 70 - 99 mg/dL  GLUCOSE, CAPILLARY     Status: Abnormal   Collection Time    01/14/14  4:12 PM      Result Value Ref Range   Glucose-Capillary 143 (*) 70 - 99 mg/dL  CBC     Status:  Abnormal   Collection Time    01/15/14  4:07 AM      Result Value Ref Range   WBC 5.5  4.0 - 10.5 K/uL   RBC 2.72 (*) 3.87 - 5.11 MIL/uL   Hemoglobin 8.2 (*) 12.0 - 15.0 g/dL   HCT 27.2 (*) 36.0 - 46.0 %   MCV 100.0  78.0 - 100.0 fL   MCH 30.1  26.0 - 34.0 pg   MCHC 30.1  30.0 - 36.0 g/dL   RDW 22.6 (*) 11.5 - 15.5 %   Platelets 103 (*) 150 - 400 K/uL   Comment: SPECIMEN CHECKED FOR CLOTS     CONSISTENT WITH PREVIOUS RESULT  BASIC METABOLIC PANEL     Status: Abnormal   Collection Time    01/15/14  4:07 AM      Result Value Ref Range   Sodium 140  137 - 147 mEq/L   Potassium 5.4 (*) 3.7 - 5.3 mEq/L   Chloride 96  96 - 112 mEq/L   CO2 38 (*) 19 - 32 mEq/L   Glucose, Bld 155 (*) 70 - 99 mg/dL   BUN 66 (*) 6 - 23 mg/dL   Creatinine, Ser 2.42 (*) 0.50 - 1.10 mg/dL   Calcium 9.1  8.4 - 10.5 mg/dL   GFR calc non Af Amer 21 (*) >90 mL/min   GFR calc Af Amer 25 (*) >90 mL/min   Comment: (NOTE)     The eGFR has been calculated using the CKD EPI equation.     This calculation has not been validated in all clinical situations.     eGFR's persistently <90 mL/min signify possible Chronic Kidney     Disease.   Anion gap  6  5 - 15    ABGS  Recent Labs  01/13/14 0526  PHART 7.297*  PO2ART 74.2*  TCO2 36.7  HCO3 37.9*   CULTURES Recent Results (from the past 240 hour(s))  URINE CULTURE     Status: None   Collection Time    01/06/14  2:02 PM      Result Value Ref Range Status   Specimen Description URINE, CATHETERIZED   Final   Special Requests NONE   Final   Culture  Setup Time     Final   Value: 01/06/2014 21:21     Performed at [place]     Final   Value: >=100,000 COLONIES/ML     Performed at [facility]   Culture     Final   Value: KLEBSIELLA PNEUMONIAE     Performed at [facility]   Report Status 01/08/2014 FINAL   Final   Organism ID, Bacteria KLEBSIELLA PNEUMONIAE   Final  MRSA PCR SCREENING     Status: Abnormal    Collection Time    01/06/14  6:25 PM      Result Value Ref Range Status   MRSA by PCR POSITIVE (*) NEGATIVE Final   Comment:            The GeneXpert MRSA Assay (FDA     approved for NASAL specimens     only), is one component of a     comprehensive MRSA colonization     surveillance program. It is not     intended to diagnose MRSA     infection nor to guide or     monitor treatment for     MRSA infections.     CRITICAL RESULT CALLED TO, READ BACK BY AND VERIFIED WITH:     M.GRAY AT 2110 ON 01/06/14 BY S.VANHOORNE   Studies/Results: No results found.  Medications: I have reviewed the patient's current medications.  Assesment: Active Problems:   Anemia Pulmonary edema  UTI  Diabetes mellitus  History of hypothyroidism  COPD Acute renal failure   Plan: Medications reviewed D/Lasix D/C levaquin Repeat chest x-ray EGD as schedualed Will monitor CBC/BMP     LOS: 9 days   FANTA,TESFAYE 01/15/2014, 7:58 AM

## 2014-01-15 NOTE — Interval H&P Note (Signed)
History and Physical Interval Note:  01/15/2014 8:47 AM  [name] Green  has presented today for surgery, with the diagnosis of GI Bleed and anemia  The various methods of treatment have been discussed with the patient and family. After consideration of risks, benefits and other options for treatment, the patient has consented to  Procedure(s): ESOPHAGOGASTRODUODENOSCOPY (EGD) (N/A) as a surgical intervention .  The patient's history has been reviewed, patient examined, no change in status, stable for surgery.  I have reviewed the patient's chart and labs.  Questions were answered to the patient's satisfaction.     Eaton CorporationSandi Felice Hope

## 2014-01-15 NOTE — Op Note (Signed)
[facility] [address] [place], [id number]   ENDOSCOPY PROCEDURE REPORT  PATIENT: [name] SnareWright, James C.  MR#: [id number] BIRTHDATE: [birth year] , 58  yrs. old GENDER: Female  ENDOSCOPIST: Jonette EvaSandi Remie Mathison, MD REFERRED XB:[id number]:Tesafaye Fanta, M.D. PROCEDURE DATE: 01/15/2014 PROCEDURE:   EGD w/ biopsy and EGD w/ control of bleeding  INDICATIONS:Anemia.   Heme positive stool. ON ASA. PLT CT 106k. Cr 2.54 MEDICATIONS: Fentanyl 75 mcg IV and Versed 5 mg IV TOPICAL ANESTHETIC:   Viscous Xylocaine  DESCRIPTION OF PROCEDURE:     Physical exam was performed.  Informed consent was obtained from the patient after explaining the benefits, risks, and alternatives to the procedure.  The patient was connected to the monitor and placed in the left lateral position.  Continuous oxygen was provided by nasal cannula and IV medicine administered through an indwelling cannula.  After administration of sedation, the patients esophagus was intubated and the [id number] ([id number]  endoscope was advanced under direct visualization to the second portion of the duodenum.  The scope was removed slowly by carefully examining the color, texture, anatomy, and integrity of the mucosa on the way out.  The patient was recovered in endoscopy and discharged home in satisfactory condition.   ESOPHAGUS: The mucosa of the esophagus appeared normal.   STOMACH: FRESH BLOOD IN ANTRUM.  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  BICAP APPLIED(25 w).  HEMOSTASIS ACHEIVED. Moderate erosive gastritis (inflammation) was found in the entire examined stomach.  Multiple biopsies were performed using cold forceps. BIOPSY SITE ACTIVELY OOZING. NO BRISK BLEEDING.     DUODENUM: FRIABLE DUODENAL MUCOSA. BLED EASILY WITH SCOPE TRAUMA.  SINGLE DUODENAL DIVERTICULUM. NORMAL SECOND PORTION OF THE DUODENUM. COMPLICATIONS:   None  ENDOSCOPIC IMPRESSION: 1. TRANSFUSION DEPENDENT ANEMIA DUE TO CRI/GASTRITIS/THROMOBOCYTOPENIA/DIEULAFOYS  LESION OF THE PYLORUS 2.  MILD DUODENITIS 3.  MILD DUODENAL DIVERTICULOSIS  RECOMMENDATIONS: D/C ASA DUE TO PLT CT < 150 K. BID PPI ADVANCE DIET AWAIT BIOPSY OPV IN 2 MOS to arrange colonoscopy.   REPEAT EXAM: _______________________________ Rosalie DoctoreSignedJonette Eva:  Diangelo Radel, MD 01/15/2014 10:55 AM

## 2014-01-16 LAB — GLUCOSE, CAPILLARY
Glucose-Capillary: 163 mg/dL — ABNORMAL HIGH (ref 70–99)
Glucose-Capillary: 149 mg/dL — ABNORMAL HIGH (ref 70–99)
Glucose-Capillary: 178 mg/dL — ABNORMAL HIGH (ref 70–99)
Glucose-Capillary: 189 mg/dL — ABNORMAL HIGH (ref 70–99)

## 2014-01-16 LAB — COMPREHENSIVE METABOLIC PANEL
ALBUMIN: 2.7 g/dL — AB (ref 3.5–5.2)
ALT: 15 U/L (ref 0–35)
AST: 30 U/L (ref 0–37)
Alkaline Phosphatase: 114 U/L (ref 39–117)
Anion gap: 8 (ref 5–15)
BUN: 76 mg/dL — ABNORMAL HIGH (ref 6–23)
CALCIUM: 9.1 mg/dL (ref 8.4–10.5)
CHLORIDE: 94 meq/L — AB (ref 96–112)
CO2: 38 mEq/L — ABNORMAL HIGH (ref 19–32)
CREATININE: 2.53 mg/dL — AB (ref 0.50–1.10)
GFR calc Af Amer: 23 mL/min — ABNORMAL LOW (ref >90)
GFR calc non Af Amer: 20 mL/min — ABNORMAL LOW (ref >90)
Glucose, Bld: 180 mg/dL — ABNORMAL HIGH (ref 70–99)
Potassium: 5.4 mEq/L — ABNORMAL HIGH (ref 3.7–5.3)
Sodium: 140 mEq/L (ref 137–147)
Total Bilirubin: 1.9 mg/dL — ABNORMAL HIGH (ref 0.3–1.2)
Total Protein: 6.5 g/dL (ref 6.0–8.3)

## 2014-01-16 LAB — BASIC METABOLIC PANEL
Anion gap: 7 (ref 5–15)
BUN: 76 mg/dL — ABNORMAL HIGH (ref 6–23)
CHLORIDE: 95 meq/L — AB (ref 96–112)
CO2: 38 mEq/L — ABNORMAL HIGH (ref 19–32)
Calcium: 9.1 mg/dL (ref 8.4–10.5)
Creatinine, Ser: 2.69 mg/dL — ABNORMAL HIGH (ref 0.50–1.10)
GFR calc non Af Amer: 19 mL/min — ABNORMAL LOW (ref >90)
GFR, EST AFRICAN AMERICAN: 22 mL/min — AB (ref >90)
Glucose, Bld: 171 mg/dL — ABNORMAL HIGH (ref 70–99)
POTASSIUM: 5.4 meq/L — AB (ref 3.7–5.3)
SODIUM: 140 meq/L (ref 137–147)

## 2014-01-16 MED ORDER — FUROSEMIDE 10 MG/ML IJ SOLN
40.0000 mg | Freq: Two times a day (BID) | INTRAMUSCULAR | Status: DC
  Administered 2014-01-16 – 2014-01-18 (×5): 40 mg via INTRAVENOUS
  Filled 2014-01-16 (×5): qty 4

## 2014-01-16 MED ORDER — SODIUM CHLORIDE 0.9 % IV SOLN
INTRAVENOUS | Status: DC
  Administered 2014-01-16 – 2014-01-18 (×3): via INTRAVENOUS

## 2014-01-16 NOTE — Progress Notes (Signed)
REVIEWED.  

## 2014-01-16 NOTE — Progress Notes (Signed)
Subjective: Patient is resting. She had EGD yesterday which showed gastritis. She is using BIPAP during the night. Her renal function continue to deteriorate. She is off diuretics. Objective: Vital signs in last 24 hours: Temp:  [97.7 F (36.5 C)-98.5 F (36.9 C)] 98.5 F (36.9 C) (08/03 2227) Pulse Rate:  [72-101] 95 (08/04 0647) Resp:  [13-20] 20 (08/04 0647) BP: (80-147)/(36-75) 95/48 mmHg (08/04 0647) SpO2:  [80 %-100 %] 96 % (08/04 0755) Weight:  [163.749 kg (361 lb)] 163.749 kg (361 lb) (08/03 0818) Weight change:  Last BM Date: 01/06/14  Intake/Output from previous day: 08/03 0701 - 08/04 0700 In: -  Out: 600 [Urine:600]  PHYSICAL EXAM General appearance: alert and no distress Resp: diminished breath sounds bilaterally and rhonchi bilaterally Cardio: S1, S2 normal GI: soft, non-tender; bowel sounds normal; no masses,  no organomegaly Extremities: edema 2++  Lab Results:  Results for orders placed during the hospital encounter of 01/06/14 (from the past 48 hour(s))  GLUCOSE, CAPILLARY     Status: Abnormal   Collection Time    01/14/14 10:54 AM      Result Value Ref Range   Glucose-Capillary 161 (*) 70 - 99 mg/dL  GLUCOSE, CAPILLARY     Status: Abnormal   Collection Time    01/14/14  4:12 PM      Result Value Ref Range   Glucose-Capillary 143 (*) 70 - 99 mg/dL  CBC     Status: Abnormal   Collection Time    01/15/14  4:07 AM      Result Value Ref Range   WBC 5.5  4.0 - 10.5 K/uL   RBC 2.72 (*) 3.87 - 5.11 MIL/uL   Hemoglobin 8.2 (*) 12.0 - 15.0 g/dL   HCT 27.2 (*) 36.0 - 46.0 %   MCV 100.0  78.0 - 100.0 fL   MCH 30.1  26.0 - 34.0 pg   MCHC 30.1  30.0 - 36.0 g/dL   RDW 22.6 (*) 11.5 - 15.5 %   Platelets 103 (*) 150 - 400 K/uL   Comment: SPECIMEN CHECKED FOR CLOTS     CONSISTENT WITH PREVIOUS RESULT  BASIC METABOLIC PANEL     Status: Abnormal   Collection Time    01/15/14  4:07 AM      Result Value Ref Range   Sodium 140  137 - 147 mEq/L   Potassium 5.4  (*) 3.7 - 5.3 mEq/L   Chloride 96  96 - 112 mEq/L   CO2 38 (*) 19 - 32 mEq/L   Glucose, Bld 155 (*) 70 - 99 mg/dL   BUN 66 (*) 6 - 23 mg/dL   Creatinine, Ser 2.42 (*) 0.50 - 1.10 mg/dL   Calcium 9.1  8.4 - 10.5 mg/dL   GFR calc non Af Amer 21 (*) >90 mL/min   GFR calc Af Amer 25 (*) >90 mL/min   Comment: (NOTE)     The eGFR has been calculated using the CKD EPI equation.     This calculation has not been validated in all clinical situations.     eGFR's persistently <90 mL/min signify possible Chronic Kidney     Disease.   Anion gap 6  5 - 15  GLUCOSE, CAPILLARY     Status: Abnormal   Collection Time    01/15/14  8:38 AM      Result Value Ref Range   Glucose-Capillary 120 (*) 70 - 99 mg/dL  GLUCOSE, CAPILLARY     Status: Abnormal   Collection  Time    01/15/14 11:52 AM      Result Value Ref Range   Glucose-Capillary 138 (*) 70 - 99 mg/dL  GLUCOSE, CAPILLARY     Status: Abnormal   Collection Time    01/15/14  4:26 PM      Result Value Ref Range   Glucose-Capillary 149 (*) 70 - 99 mg/dL  GLUCOSE, CAPILLARY     Status: Abnormal   Collection Time    01/15/14  8:56 PM      Result Value Ref Range   Glucose-Capillary 160 (*) 70 - 99 mg/dL  BASIC METABOLIC PANEL     Status: Abnormal   Collection Time    01/16/14  6:27 AM      Result Value Ref Range   Sodium 140  137 - 147 mEq/L   Potassium 5.4 (*) 3.7 - 5.3 mEq/L   Chloride 95 (*) 96 - 112 mEq/L   CO2 38 (*) 19 - 32 mEq/L   Glucose, Bld 171 (*) 70 - 99 mg/dL   BUN 76 (*) 6 - 23 mg/dL   Creatinine, Ser 2.69 (*) 0.50 - 1.10 mg/dL   Calcium 9.1  8.4 - 10.5 mg/dL   GFR calc non Af Amer 19 (*) >90 mL/min   GFR calc Af Amer 22 (*) >90 mL/min   Comment: (NOTE)     The eGFR has been calculated using the CKD EPI equation.     This calculation has not been validated in all clinical situations.     eGFR's persistently <90 mL/min signify possible Chronic Kidney     Disease.   Anion gap 7  5 - 15  GLUCOSE, CAPILLARY     Status:  Abnormal   Collection Time    01/16/14  7:47 AM      Result Value Ref Range   Glucose-Capillary 163 (*) 70 - 99 mg/dL   Comment 1 Notify RN      ABGS No results found for this basename: PHART, PCO2, PO2ART, TCO2, HCO3,  in the last 72 hours CULTURES Recent Results (from the past 240 hour(s))  URINE CULTURE     Status: None   Collection Time    01/06/14  2:02 PM      Result Value Ref Range Status   Specimen Description URINE, CATHETERIZED   Final   Special Requests NONE   Final   Culture  Setup Time     Final   Value: 01/06/2014 21:21     Performed at [facility]     Final   Value: >=100,000 COLONIES/ML     Performed at [facility]   Culture     Final   Value: KLEBSIELLA PNEUMONIAE     Performed at [facility]   Report Status 01/08/2014 FINAL   Final   Organism ID, Bacteria KLEBSIELLA PNEUMONIAE   Final  MRSA PCR SCREENING     Status: Abnormal   Collection Time    01/06/14  6:25 PM      Result Value Ref Range Status   MRSA by PCR POSITIVE (*) NEGATIVE Final   Comment:            The GeneXpert MRSA Assay (FDA     approved for NASAL specimens     only), is one component of a     comprehensive MRSA colonization     surveillance program. It is not     intended to diagnose MRSA  infection nor to guide or     monitor treatment for     MRSA infections.     CRITICAL RESULT CALLED TO, READ BACK BY AND VERIFIED WITH:     M.GRAY AT 2110 ON 01/06/14 BY S.VANHOORNE   Studies/Results: Dg Chest 1 View  01/15/2014   CLINICAL DATA:  CHF  EXAM: CHEST - 1 VIEW  COMPARISON:  01/11/2014  FINDINGS: There are bilateral diffuse interstitial and alveolar airspace opacities. There are bilateral small pleural effusions. There is no pneumothorax. There is stable cardiomegaly. There is enlargement of the central pulmonary vasculature. There is degenerative change of the right AC joint.  IMPRESSION: Overall findings consistent with pulmonary edema.    Electronically Signed   By: Kathreen Devoid   On: 01/15/2014 10:20    Medications: I have reviewed the patient's current medications.  Assesment: Active Problems:   Anemia Pulmonary edema  UTI  Diabetes mellitus  History of hypothyroidism  COPD Acute renal failure   Plan: Medications reviewed Nephrology consult Continue current treatment Will monitor CBC/BMP     LOS: 10 days   Lauris Serviss 01/16/2014, 8:13 AM

## 2014-01-16 NOTE — Progress Notes (Signed)
Subjective: She says she feels okay. She has no complaints. She says her breathing is doing well. She is still using BiPAP. She had EGD yesterday.  Objective: Vital signs in last 24 hours: Temp:  [98 F (36.7 C)-98.5 F (36.9 C)] 98.5 F (36.9 C) (08/03 2227) Pulse Rate:  [72-101] 95 (08/04 0647) Resp:  [13-20] 20 (08/04 0647) BP: (80-147)/(36-75) 95/48 mmHg (08/04 0647) SpO2:  [80 %-100 %] 96 % (08/04 0755) Weight change:  Last BM Date: 01/06/14  Intake/Output from previous day: 08/03 0701 - 08/04 0700 In: -  Out: 600 [Urine:600]  PHYSICAL EXAM General appearance: alert, cooperative, no distress and morbidly obese Resp: rhonchi bilaterally Cardio: regular rate and rhythm, S1, S2 normal, no murmur, click, rub or gallop GI: soft, non-tender; bowel sounds normal; no masses,  no organomegaly Extremities: She still has edema but it appears about back to her baseline  Lab Results:  Results for orders placed during the hospital encounter of 01/06/14 (from the past 48 hour(s))  GLUCOSE, CAPILLARY     Status: Abnormal   Collection Time    01/14/14 10:54 AM      Result Value Ref Range   Glucose-Capillary 161 (*) 70 - 99 mg/dL  GLUCOSE, CAPILLARY     Status: Abnormal   Collection Time    01/14/14  4:12 PM      Result Value Ref Range   Glucose-Capillary 143 (*) 70 - 99 mg/dL  CBC     Status: Abnormal   Collection Time    01/15/14  4:07 AM      Result Value Ref Range   WBC 5.5  4.0 - 10.5 K/uL   RBC 2.72 (*) 3.87 - 5.11 MIL/uL   Hemoglobin 8.2 (*) 12.0 - 15.0 g/dL   HCT 27.2 (*) 36.0 - 46.0 %   MCV 100.0  78.0 - 100.0 fL   MCH 30.1  26.0 - 34.0 pg   MCHC 30.1  30.0 - 36.0 g/dL   RDW 22.6 (*) 11.5 - 15.5 %   Platelets 103 (*) 150 - 400 K/uL   Comment: SPECIMEN CHECKED FOR CLOTS     CONSISTENT WITH PREVIOUS RESULT  BASIC METABOLIC PANEL     Status: Abnormal   Collection Time    01/15/14  4:07 AM      Result Value Ref Range   Sodium 140  137 - 147 mEq/L   Potassium 5.4  (*) 3.7 - 5.3 mEq/L   Chloride 96  96 - 112 mEq/L   CO2 38 (*) 19 - 32 mEq/L   Glucose, Bld 155 (*) 70 - 99 mg/dL   BUN 66 (*) 6 - 23 mg/dL   Creatinine, Ser 2.42 (*) 0.50 - 1.10 mg/dL   Calcium 9.1  8.4 - 10.5 mg/dL   GFR calc non Af Amer 21 (*) >90 mL/min   GFR calc Af Amer 25 (*) >90 mL/min   Comment: (NOTE)     The eGFR has been calculated using the CKD EPI equation.     This calculation has not been validated in all clinical situations.     eGFR's persistently <90 mL/min signify possible Chronic Kidney     Disease.   Anion gap 6  5 - 15  GLUCOSE, CAPILLARY     Status: Abnormal   Collection Time    01/15/14  8:38 AM      Result Value Ref Range   Glucose-Capillary 120 (*) 70 - 99 mg/dL  GLUCOSE, CAPILLARY  Status: Abnormal   Collection Time    01/15/14 11:52 AM      Result Value Ref Range   Glucose-Capillary 138 (*) 70 - 99 mg/dL  GLUCOSE, CAPILLARY     Status: Abnormal   Collection Time    01/15/14  4:26 PM      Result Value Ref Range   Glucose-Capillary 149 (*) 70 - 99 mg/dL  GLUCOSE, CAPILLARY     Status: Abnormal   Collection Time    01/15/14  8:56 PM      Result Value Ref Range   Glucose-Capillary 160 (*) 70 - 99 mg/dL  BASIC METABOLIC PANEL     Status: Abnormal   Collection Time    01/16/14  6:27 AM      Result Value Ref Range   Sodium 140  137 - 147 mEq/L   Potassium 5.4 (*) 3.7 - 5.3 mEq/L   Chloride 95 (*) 96 - 112 mEq/L   CO2 38 (*) 19 - 32 mEq/L   Glucose, Bld 171 (*) 70 - 99 mg/dL   BUN 76 (*) 6 - 23 mg/dL   Creatinine, Ser 2.69 (*) 0.50 - 1.10 mg/dL   Calcium 9.1  8.4 - 10.5 mg/dL   GFR calc non Af Amer 19 (*) >90 mL/min   GFR calc Af Amer 22 (*) >90 mL/min   Comment: (NOTE)     The eGFR has been calculated using the CKD EPI equation.     This calculation has not been validated in all clinical situations.     eGFR's persistently <90 mL/min signify possible Chronic Kidney     Disease.   Anion gap 7  5 - 15  GLUCOSE, CAPILLARY     Status:  Abnormal   Collection Time    01/16/14  7:47 AM      Result Value Ref Range   Glucose-Capillary 163 (*) 70 - 99 mg/dL   Comment 1 Notify RN      ABGS No results found for this basename: PHART, PCO2, PO2ART, TCO2, HCO3,  in the last 72 hours CULTURES Recent Results (from the past 240 hour(s))  URINE CULTURE     Status: None   Collection Time    01/06/14  2:02 PM      Result Value Ref Range Status   Specimen Description URINE, CATHETERIZED   Final   Special Requests NONE   Final   Culture  Setup Time     Final   Value: 01/06/2014 21:21     Performed at [facility]     Final   Value: >=100,000 COLONIES/ML     Performed at [facility]   Culture     Final   Value: KLEBSIELLA PNEUMONIAE     Performed at [facility]   Report Status 01/08/2014 FINAL   Final   Organism ID, Bacteria KLEBSIELLA PNEUMONIAE   Final  MRSA PCR SCREENING     Status: Abnormal   Collection Time    01/06/14  6:25 PM      Result Value Ref Range Status   MRSA by PCR POSITIVE (*) NEGATIVE Final   Comment:            The GeneXpert MRSA Assay (FDA     approved for NASAL specimens     only), is one component of a     comprehensive MRSA colonization     surveillance program. It is not     intended to  diagnose MRSA     infection nor to guide or     monitor treatment for     MRSA infections.     CRITICAL RESULT CALLED TO, READ BACK BY AND VERIFIED WITH:     M.GRAY AT 2110 ON 01/06/14 BY S.VANHOORNE   Studies/Results: Dg Chest 1 View  01/15/2014   CLINICAL DATA:  CHF  EXAM: CHEST - 1 VIEW  COMPARISON:  01/11/2014  FINDINGS: There are bilateral diffuse interstitial and alveolar airspace opacities. There are bilateral small pleural effusions. There is no pneumothorax. There is stable cardiomegaly. There is enlargement of the central pulmonary vasculature. There is degenerative change of the right AC joint.  IMPRESSION: Overall findings consistent with pulmonary edema.    Electronically Signed   By: Kathreen Devoid   On: 01/15/2014 10:20    Medications:  Prior to Admission:  Prescriptions prior to admission  Medication Sig Dispense Refill  . albuterol (PROVENTIL) (2.5 MG/3ML) 0.083% nebulizer solution Take 3 mLs (2.5 mg total) by nebulization every 4 (four) hours as needed for wheezing or shortness of breath.  75 mL  12  . aspirin EC 81 MG EC tablet Take 1 tablet (81 mg total) by mouth daily.  30 tablet  12  . clonazePAM (KLONOPIN) 1 MG tablet Take 0.5 tablets (0.5 mg total) by mouth 4 (four) times daily as needed for anxiety.  30 tablet  0  . DULoxetine (CYMBALTA) 20 MG capsule Take 1 capsule (20 mg total) by mouth daily.  30 capsule  3  . folic acid (FOLVITE) 1 MG tablet Take 1 tablet (1 mg total) by mouth daily.  30 tablet  12  . furosemide (LASIX) 40 MG tablet Take 1 tablet (40 mg total) by mouth daily.  30 tablet  1  . HYDROcodone-acetaminophen (NORCO/VICODIN) 5-325 MG per tablet Take 1 tablet by mouth every 4 (four) hours as needed for moderate pain.      Marland Kitchen insulin lispro (HUMALOG) 100 UNIT/ML injection Inject 2-10 Units into the skin 3 (three) times daily before meals. SS      . levothyroxine (SYNTHROID, LEVOTHROID) 200 MCG tablet Take 200 mcg by mouth daily before breakfast.      . lisinopril (PRINIVIL,ZESTRIL) 5 MG tablet Take 1 tablet (5 mg total) by mouth daily.  30 tablet  1  . PARoxetine (PAXIL) 20 MG tablet Take 20 mg by mouth every morning.      . potassium chloride SA (K-DUR,KLOR-CON) 20 MEQ tablet Take 40 mEq by mouth daily.      . promethazine (PHENERGAN) 25 MG tablet Take 25 mg by mouth every 6 (six) hours as needed for nausea or vomiting.      . simvastatin (ZOCOR) 40 MG tablet Take 40 mg by mouth every morning.       . temazepam (RESTORIL) 15 MG capsule Take 15 mg by mouth at bedtime as needed for sleep.       Scheduled: . antiseptic oral rinse  15 mL Mouth Rinse BID  . antiseptic oral rinse  15 mL Mouth Rinse BID  . DULoxetine  20 mg Oral  Daily  . folic acid  1 mg Oral Daily  . furosemide  40 mg Intravenous BID  . hydrocortisone   Rectal BID  . insulin aspart  0-20 Units Subcutaneous TID WC  . ipratropium-albuterol  3 mL Nebulization QID  . levothyroxine  200 mcg Oral QAC breakfast  . methylPREDNISolone (SOLU-MEDROL) injection  40 mg Intravenous Q6H  . nystatin cream  Topical BID  . pantoprazole  40 mg Oral BID AC  . PARoxetine  20 mg Oral Daily  . simvastatin  40 mg Oral q morning - 10a   Continuous: . sodium chloride 10 mL/hr at 01/14/14 1900  . sodium chloride     ETK:KOECXFQHK, benzonatate, clonazePAM, HYDROcodone-acetaminophen, ondansetron (ZOFRAN) IV, ondansetron, sodium chloride  Assesment: She has multiple medical problems. She was admitted with anemia and developed congestive heart failure apparently related to volume replacement and blood transfusions. She had EGD yesterday that shows gastritis. In addition to that she has COPD which is doing better when she uses BiPAP at night. She had acute respiratory failure. She has congestive heart failure and had been treated with diuretics but now her renal function has deteriorated. She has a history of problems with her liver and apparently acute encephalopathy in the past. Active Problems:   Anemia    Plan: I don't think is anything to change at this point. She is off diuretics but her renal function has continued to deteriorate.    LOS: 10 days   Shamiah Kahler L 01/16/2014, 9:00 AM

## 2014-01-16 NOTE — Progress Notes (Signed)
    Subjective: Denies abdominal pain, N/V. Sister at bedside reporting history of low-volume hematochezia.   Objective: Vital signs in last 24 hours: Temp:  [97.7 F (36.5 C)-98.5 F (36.9 C)] 98.5 F (36.9 C) (08/03 2227) Pulse Rate:  [72-101] 95 (08/04 0647) Resp:  [13-20] 20 (08/04 0647) BP: (80-147)/(36-75) 95/48 mmHg (08/04 0647) SpO2:  [80 %-100 %] 94 % (08/04 0647) Weight:  [361 lb (163.749 kg)] 361 lb (163.749 kg) (08/03 0818) Last BM Date: 01/06/14 General:   Alert and oriented, pleasant Head:  Normocephalic and atraumatic. Eyes:  No icterus, sclera clear.  Abdomen:  Bowel sounds present, largely obese, anasarca, no TTP Extremities:  2+ lower extremity edema Psych:  Alert and cooperative. Normal mood and affect.  Intake/Output from previous day: 08/03 0701 - 08/04 0700 In: -  Out: 600 [Urine:600] Intake/Output this shift:    Lab Results:  Recent Labs  01/14/14 0413 01/15/14 0407  WBC 7.4 5.5  HGB 8.2* 8.2*  HCT 27.6* 27.2*  PLT 126* 103*   BMET  Recent Labs  01/14/14 0413 01/15/14 0407 01/16/14 0627  NA 140 140 140  K 5.3 5.4* 5.4*  CL 95* 96 95*  CO2 39* 38* 38*  GLUCOSE 128* 155* 171*  BUN 53* 66* 76*  CREATININE 2.15* 2.42* 2.69*  CALCIUM 9.1 9.1 9.1   LFT  Recent Labs  01/14/14 0413  PROT 6.4  ALBUMIN 2.6*  AST 32  ALT 13  ALKPHOS 118*  BILITOT 1.9*     Studies/Results: Dg Chest 1 View  01/15/2014   CLINICAL DATA:  CHF  EXAM: CHEST - 1 VIEW  COMPARISON:  01/11/2014  FINDINGS: There are bilateral diffuse interstitial and alveolar airspace opacities. There are bilateral small pleural effusions. There is no pneumothorax. There is stable cardiomegaly. There is enlargement of the central pulmonary vasculature. There is degenerative change of the right AC joint.  IMPRESSION: Overall findings consistent with pulmonary edema.   Electronically Signed   By: Elige KoHetal  Patel   On: 01/15/2014 10:20    Assessment: 58 year old female with  multiple medical issues with heme positive stool and anemia. 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 Anemia multifactorial secondary to gastritis, Dieluafoy's lesion, chronic disease. Low-volume hematochezia noted in past improved with anusol. Will need outpatient colonoscopy in a more elective setting.     Plan: Follow-up on pending biopsy BID PPI Colonoscopy in 2 months as outpatient Will follow peripherally from a GI standpoint  Nira RetortAnna W. Sams, ANP-BC [facility]      LOS: 10 days    01/16/2014, 7:46 AM

## 2014-01-16 NOTE — Consult Note (Signed)
Reason for Consult: Renal failure Referring Physician: Dr. Adele Dan is an 58 y.o. female.  HPI: She is a patient who has history of her COPD, coronary artery disease, morbid obesity and chronic renal failure possibly stage III presently came with complaints of difficulty in breathing, cough and also blood in the stool. When she was evaluated patient was found to have GI bleeding and received a transfusion and seen by GI. Presently consult is called because of worsening of renal failure. Patient was seen by me a couple of weeks ago for acute on chronic renal failure thought to be secondary to prerenal syndrome and improved before she was discharged. Patient was on diuretics because of CHF but that was discontinued because of worsening of renal failure. However her renal function still is not stable. Presently she denies any nausea no vomiting and overall she says she is feeling much better.  Past Medical History  Diagnosis Date  . COPD (chronic obstructive pulmonary disease)   . Diabetes mellitus without complication   . Hypertension   . Thyroid disease   . Hyperlipemia   . Neuropathy   . Depression   . Chronic pain   . Anxiety   . CHF (congestive heart failure)   . Hemorrhoids   . Chronic cough   . Peripheral edema   . On home O2     2L N/C    Past Surgical History  Procedure Laterality Date  . Cholecystectomy      History reviewed. No pertinent family history.  Social History:  reports that she has quit smoking. She does not have any smokeless tobacco history on file. She reports that she does not drink alcohol or use illicit drugs.  Allergies:  Allergies  Allergen Reactions  . Codeine Nausea And Vomiting  . Keflex [Cephalexin] Hives and Nausea And Vomiting  . Penicillins Nausea And Vomiting    Medications: I have reviewed the patient's current medications.  Results for orders placed during the hospital encounter of 01/06/14 (from the past 48 hour(s))   GLUCOSE, CAPILLARY     Status: Abnormal   Collection Time    01/14/14 10:54 AM      Result Value Ref Range   Glucose-Capillary 161 (*) 70 - 99 mg/dL  GLUCOSE, CAPILLARY     Status: Abnormal   Collection Time    01/14/14  4:12 PM      Result Value Ref Range   Glucose-Capillary 143 (*) 70 - 99 mg/dL  CBC     Status: Abnormal   Collection Time    01/15/14  4:07 AM      Result Value Ref Range   WBC 5.5  4.0 - 10.5 K/uL   RBC 2.72 (*) 3.87 - 5.11 MIL/uL   Hemoglobin 8.2 (*) 12.0 - 15.0 g/dL   HCT 27.2 (*) 36.0 - 46.0 %   MCV 100.0  78.0 - 100.0 fL   MCH 30.1  26.0 - 34.0 pg   MCHC 30.1  30.0 - 36.0 g/dL   RDW 22.6 (*) 11.5 - 15.5 %   Platelets 103 (*) 150 - 400 K/uL   Comment: SPECIMEN CHECKED FOR CLOTS     CONSISTENT WITH PREVIOUS RESULT  BASIC METABOLIC PANEL     Status: Abnormal   Collection Time    01/15/14  4:07 AM      Result Value Ref Range   Sodium 140  137 - 147 mEq/L   Potassium 5.4 (*) 3.7 - 5.3 mEq/L  Chloride 96  96 - 112 mEq/L   CO2 38 (*) 19 - 32 mEq/L   Glucose, Bld 155 (*) 70 - 99 mg/dL   BUN 66 (*) 6 - 23 mg/dL   Creatinine, Ser 2.42 (*) 0.50 - 1.10 mg/dL   Calcium 9.1  8.4 - 10.5 mg/dL   GFR calc non Af Amer 21 (*) >90 mL/min   GFR calc Af Amer 25 (*) >90 mL/min   Comment: (NOTE)     The eGFR has been calculated using the CKD EPI equation.     This calculation has not been validated in all clinical situations.     eGFR's persistently <90 mL/min signify possible Chronic Kidney     Disease.   Anion gap 6  5 - 15  GLUCOSE, CAPILLARY     Status: Abnormal   Collection Time    01/15/14  8:38 AM      Result Value Ref Range   Glucose-Capillary 120 (*) 70 - 99 mg/dL  GLUCOSE, CAPILLARY     Status: Abnormal   Collection Time    01/15/14 11:52 AM      Result Value Ref Range   Glucose-Capillary 138 (*) 70 - 99 mg/dL  GLUCOSE, CAPILLARY     Status: Abnormal   Collection Time    01/15/14  4:26 PM      Result Value Ref Range   Glucose-Capillary 149 (*) 70  - 99 mg/dL  GLUCOSE, CAPILLARY     Status: Abnormal   Collection Time    01/15/14  8:56 PM      Result Value Ref Range   Glucose-Capillary 160 (*) 70 - 99 mg/dL  BASIC METABOLIC PANEL     Status: Abnormal   Collection Time    01/16/14  6:27 AM      Result Value Ref Range   Sodium 140  137 - 147 mEq/L   Potassium 5.4 (*) 3.7 - 5.3 mEq/L   Chloride 95 (*) 96 - 112 mEq/L   CO2 38 (*) 19 - 32 mEq/L   Glucose, Bld 171 (*) 70 - 99 mg/dL   BUN 76 (*) 6 - 23 mg/dL   Creatinine, Ser 2.69 (*) 0.50 - 1.10 mg/dL   Calcium 9.1  8.4 - 10.5 mg/dL   GFR calc non Af Amer 19 (*) >90 mL/min   GFR calc Af Amer 22 (*) >90 mL/min   Comment: (NOTE)     The eGFR has been calculated using the CKD EPI equation.     This calculation has not been validated in all clinical situations.     eGFR's persistently <90 mL/min signify possible Chronic Kidney     Disease.   Anion gap 7  5 - 15  GLUCOSE, CAPILLARY     Status: Abnormal   Collection Time    01/16/14  7:47 AM      Result Value Ref Range   Glucose-Capillary 163 (*) 70 - 99 mg/dL   Comment 1 Notify RN      Dg Chest 1 View  01/15/2014   CLINICAL DATA:  CHF  EXAM: CHEST - 1 VIEW  COMPARISON:  01/11/2014  FINDINGS: There are bilateral diffuse interstitial and alveolar airspace opacities. There are bilateral small pleural effusions. There is no pneumothorax. There is stable cardiomegaly. There is enlargement of the central pulmonary vasculature. There is degenerative change of the right AC joint.  IMPRESSION: Overall findings consistent with pulmonary edema.   Electronically Signed   By: Kathreen Devoid  On: 01/15/2014 10:20    Review of Systems  Respiratory: Positive for cough and shortness of breath. Negative for sputum production.   Cardiovascular: Positive for orthopnea and leg swelling.  Gastrointestinal: Positive for blood in stool. Negative for nausea and vomiting.  Neurological: Positive for weakness.   Blood pressure 95/48, pulse 95, temperature  98.5 F (36.9 C), temperature source Oral, resp. rate 20, height _0  (1.651 m), weight 163.749 kg (361 lb), SpO2 96.00%. Physical Exam  Constitutional: She is oriented to person, place, and time. No distress.  Eyes: No scleral icterus.  Neck: No JVD present.  Cardiovascular: Normal rate and regular rhythm.   Respiratory: No respiratory distress. She has no wheezes.  Decreased breath sound bilaterally and anteriorly.   GI: She exhibits no distension. There is no tenderness.  Musculoskeletal: She exhibits edema.  Neurological: She is alert and oriented to person, place, and time.    Assessment/Plan: Problem #1 renal failure: Chronic the EGFR on 12/15/2012 was 70 cc per minute. Recently her creatinine has gone up but also the time fluctuance depending on her fluid status. Presently she doesn't have any uremic sinus symptoms. She is none oliguric. Problem #2 hyperkalemia: her potassium presently is increasing Problem #3 history of difficulty breathing: Most likely a combination of COPD/possible sleep apnea/CHF. Presently patient seems to be feeling better. Problem #4 anemia: Secondary to GI bleeding: Patient has received a transfusion her hemoglobin and hematocrit is stable. Problem #5 morbid obesity Problem #6 hypothyroidism Problem #7 history of diabetes Problem #8 elevated ANA with low C4 from previous studies. However anti-double-stranded DNA is not available. Since patient has some proteinuria and renal failure need to rule out lupus nephritis. Plan: We'll put patient on low potassium diet Will start patient on normal saline at 75 cc per hour and use Lasix 40 mg IV twice a day to control her potassium. We'll check her basic metabolic panel in the morning. We'll check anti-double-stranded DNA and anti-Smith antibody.   Shenise Wolgamott S 01/16/2014, 8:37 AM

## 2014-01-17 ENCOUNTER — Encounter (HOSPITAL_COMMUNITY): Payer: Self-pay | Admitting: Gastroenterology

## 2014-01-17 LAB — CBC
HEMATOCRIT: 28.4 % — AB (ref 36.0–46.0)
Hemoglobin: 8.7 g/dL — ABNORMAL LOW (ref 12.0–15.0)
MCH: 30.2 pg (ref 26.0–34.0)
MCHC: 30.6 g/dL (ref 30.0–36.0)
MCV: 98.6 fL (ref 78.0–100.0)
PLATELETS: 100 10*3/uL — AB (ref 150–400)
RBC: 2.88 MIL/uL — AB (ref 3.87–5.11)
RDW: 21.2 % — ABNORMAL HIGH (ref 11.5–15.5)
WBC: 6 10*3/uL (ref 4.0–10.5)

## 2014-01-17 LAB — GLUCOSE, CAPILLARY
GLUCOSE-CAPILLARY: 154 mg/dL — AB (ref 70–99)
GLUCOSE-CAPILLARY: 166 mg/dL — AB (ref 70–99)
GLUCOSE-CAPILLARY: 139 mg/dL — AB (ref 70–99)
GLUCOSE-CAPILLARY: 162 mg/dL — AB (ref 70–99)

## 2014-01-17 LAB — BASIC METABOLIC PANEL
Anion gap: 8 (ref 5–15)
BUN: 80 mg/dL — AB (ref 6–23)
CALCIUM: 8.9 mg/dL (ref 8.4–10.5)
CO2: 36 meq/L — AB (ref 19–32)
Chloride: 97 mEq/L (ref 96–112)
Creatinine, Ser: 2.56 mg/dL — ABNORMAL HIGH (ref 0.50–1.10)
GFR calc Af Amer: 23 mL/min — ABNORMAL LOW (ref >90)
GFR calc non Af Amer: 20 mL/min — ABNORMAL LOW (ref >90)
GLUCOSE: 164 mg/dL — AB (ref 70–99)
Potassium: 5.1 mEq/L (ref 3.7–5.3)
Sodium: 141 mEq/L (ref 137–147)

## 2014-01-17 LAB — ANTI-DNA ANTIBODY, DOUBLE-STRANDED

## 2014-01-17 LAB — ANTI-SMITH ANTIBODY: ENA SM AB SER-ACNC: NEGATIVE

## 2014-01-17 LAB — PHOSPHORUS: Phosphorus: 5.1 mg/dL — ABNORMAL HIGH (ref 2.3–4.6)

## 2014-01-17 NOTE — Progress Notes (Signed)
Patient stated that she no longer wanted to go to a skilled nursing facility at discharge.

## 2014-01-17 NOTE — Clinical Social Work Placement (Signed)
Clinical Social Work Department CLINICAL SOCIAL WORK PLACEMENT NOTE 01/17/2014  Patient:  Elayne SnareWRIGHT,Malyia C  Account Number:  [id number] Admit date:  01/06/2014  Clinical Social Worker:  Derenda FennelKARA Skyelynn Rambeau, LCSW  Date/time:  01/17/2014 02:24 PM  Clinical Social Work is seeking post-discharge placement for this patient at the following level of care:   SKILLED NURSING   (*CSW will update this form in Epic as items are completed)   01/17/2014  Patient/family provided with [facility] Clinical [facility] Work's list of facilities offering this level of care within the geographic area requested by the patient (or if unable, by the patient's family).  01/17/2014  Patient/family informed of their freedom to choose among providers that offer the needed level of care, that participate in Medicare, Medicaid or managed care program needed by the patient, have an available bed and are willing to accept the patient.  01/17/2014  Patient/family informed of [facility]' ownership interest in [facility], as well as of the fact that they are under no obligation to receive care at this facility.  PASARR submitted to EDS on 01/17/2014 PASARR number received on   FL2 transmitted to all facilities in geographic area requested by pt/family on  01/17/2014 FL2 transmitted to all facilities within larger geographic area on   Patient informed that his/her managed care company has contracts with or will negotiate with  certain facilities, including the following:     Patient/family informed of bed offers received:   Patient chooses bed at  Physician recommends and patient chooses bed at    Patient to be transferred to  on   Patient to be transferred to facility by  Patient and family notified of transfer on  Name of family member notified:    The following physician request were entered in Epic:   Additional Comments:  Derenda FennelKara Kania Regnier, LCSW [phone]

## 2014-01-17 NOTE — Progress Notes (Signed)
She's doing okay on her current medications for her chest. She is still having problems with her renal function and is being evaluated by nephrology. I will continue to follow because of concerns that she gets volume overloaded again she may have trouble with her respiratory status.

## 2014-01-17 NOTE — Progress Notes (Signed)
Subjective: Patient is resting. She is doing better. Her renal function continue to deteriorate. She is being followed by nephrologist who started her on IV fluid and IV lasix.. Objective: Vital signs in last 24 hours: Temp:  [97.7 F (36.5 C)-97.8 F (36.6 C)] 97.7 F (36.5 C) (08/05 0410) Pulse Rate:  [93-97] 93 (08/05 0410) Resp:  [16-20] 16 (08/05 0410) BP: (103-114)/(52-59) 103/58 mmHg (08/05 0410) SpO2:  [76 %-98 %] 96 % (08/05 0711) Weight change:  Last BM Date: 01/06/14  Intake/Output from previous day: 08/04 0701 - 08/05 0700 In: 240 [P.O.:240] Out: 1950 [Urine:1950]  PHYSICAL EXAM General appearance: alert and no distress Resp: diminished breath sounds bilaterally and rhonchi bilaterally Cardio: S1, S2 normal GI: soft, non-tender; bowel sounds normal; no masses,  no organomegaly Extremities: edema 2++  Lab Results:  Results for orders placed during the hospital encounter of 01/06/14 (from the past 48 hour(s))  GLUCOSE, CAPILLARY     Status: Abnormal   Collection Time    01/15/14  8:38 AM      Result Value Ref Range   Glucose-Capillary 120 (*) 70 - 99 mg/dL  GLUCOSE, CAPILLARY     Status: Abnormal   Collection Time    01/15/14 11:52 AM      Result Value Ref Range   Glucose-Capillary 138 (*) 70 - 99 mg/dL  GLUCOSE, CAPILLARY     Status: Abnormal   Collection Time    01/15/14  4:26 PM      Result Value Ref Range   Glucose-Capillary 149 (*) 70 - 99 mg/dL  GLUCOSE, CAPILLARY     Status: Abnormal   Collection Time    01/15/14  8:56 PM      Result Value Ref Range   Glucose-Capillary 160 (*) 70 - 99 mg/dL  BASIC METABOLIC PANEL     Status: Abnormal   Collection Time    01/16/14  6:27 AM      Result Value Ref Range   Sodium 140  137 - 147 mEq/L   Potassium 5.4 (*) 3.7 - 5.3 mEq/L   Chloride 95 (*) 96 - 112 mEq/L   CO2 38 (*) 19 - 32 mEq/L   Glucose, Bld 171 (*) 70 - 99 mg/dL   BUN 76 (*) 6 - 23 mg/dL   Creatinine, Ser 2.69 (*) 0.50 - 1.10 mg/dL   Calcium  9.1  8.4 - 10.5 mg/dL   GFR calc non Af Amer 19 (*) >90 mL/min   GFR calc Af Amer 22 (*) >90 mL/min   Comment: (NOTE)     The eGFR has been calculated using the CKD EPI equation.     This calculation has not been validated in all clinical situations.     eGFR's persistently <90 mL/min signify possible Chronic Kidney     Disease.   Anion gap 7  5 - 15  GLUCOSE, CAPILLARY     Status: Abnormal   Collection Time    01/16/14  7:47 AM      Result Value Ref Range   Glucose-Capillary 163 (*) 70 - 99 mg/dL   Comment 1 Notify RN    COMPREHENSIVE METABOLIC PANEL     Status: Abnormal   Collection Time    01/16/14 10:00 AM      Result Value Ref Range   Sodium 140  137 - 147 mEq/L   Potassium 5.4 (*) 3.7 - 5.3 mEq/L   Chloride 94 (*) 96 - 112 mEq/L   CO2 38 (*) 19 -  32 mEq/L   Glucose, Bld 180 (*) 70 - 99 mg/dL   BUN 76 (*) 6 - 23 mg/dL   Creatinine, Ser 2.53 (*) 0.50 - 1.10 mg/dL   Calcium 9.1  8.4 - 10.5 mg/dL   Total Protein 6.5  6.0 - 8.3 g/dL   Albumin 2.7 (*) 3.5 - 5.2 g/dL   AST 30  0 - 37 U/L   ALT 15  0 - 35 U/L   Alkaline Phosphatase 114  39 - 117 U/L   Total Bilirubin 1.9 (*) 0.3 - 1.2 mg/dL   GFR calc non Af Amer 20 (*) >90 mL/min   GFR calc Af Amer 23 (*) >90 mL/min   Comment: (NOTE)     The eGFR has been calculated using the CKD EPI equation.     This calculation has not been validated in all clinical situations.     eGFR's persistently <90 mL/min signify possible Chronic Kidney     Disease.   Anion gap 8  5 - 15  GLUCOSE, CAPILLARY     Status: Abnormal   Collection Time    01/16/14 11:23 AM      Result Value Ref Range   Glucose-Capillary 149 (*) 70 - 99 mg/dL   Comment 1 Notify RN    GLUCOSE, CAPILLARY     Status: Abnormal   Collection Time    01/16/14  4:49 PM      Result Value Ref Range   Glucose-Capillary 178 (*) 70 - 99 mg/dL   Comment 1 Notify RN     Comment 2 Documented in Chart    GLUCOSE, CAPILLARY     Status: Abnormal   Collection Time    01/16/14   9:44 PM      Result Value Ref Range   Glucose-Capillary 189 (*) 70 - 99 mg/dL   Comment 1 Notify RN    BASIC METABOLIC PANEL     Status: Abnormal   Collection Time    01/17/14  5:32 AM      Result Value Ref Range   Sodium 141  137 - 147 mEq/L   Potassium 5.1  3.7 - 5.3 mEq/L   Chloride 97  96 - 112 mEq/L   CO2 36 (*) 19 - 32 mEq/L   Glucose, Bld 164 (*) 70 - 99 mg/dL   BUN 80 (*) 6 - 23 mg/dL   Creatinine, Ser 2.56 (*) 0.50 - 1.10 mg/dL   Calcium 8.9  8.4 - 10.5 mg/dL   GFR calc non Af Amer 20 (*) >90 mL/min   GFR calc Af Amer 23 (*) >90 mL/min   Comment: (NOTE)     The eGFR has been calculated using the CKD EPI equation.     This calculation has not been validated in all clinical situations.     eGFR's persistently <90 mL/min signify possible Chronic Kidney     Disease.   Anion gap 8  5 - 15  CBC     Status: Abnormal   Collection Time    01/17/14  5:32 AM      Result Value Ref Range   WBC 6.0  4.0 - 10.5 K/uL   RBC 2.88 (*) 3.87 - 5.11 MIL/uL   Hemoglobin 8.7 (*) 12.0 - 15.0 g/dL   HCT 28.4 (*) 36.0 - 46.0 %   MCV 98.6  78.0 - 100.0 fL   MCH 30.2  26.0 - 34.0 pg   MCHC 30.6  30.0 - 36.0 g/dL   RDW  21.2 (*) 11.5 - 15.5 %   Platelets 100 (*) 150 - 400 K/uL   Comment: SPECIMEN CHECKED FOR CLOTS     PLATELET COUNT CONFIRMED BY SMEAR  PHOSPHORUS     Status: Abnormal   Collection Time    01/17/14  5:32 AM      Result Value Ref Range   Phosphorus 5.1 (*) 2.3 - 4.6 mg/dL    ABGS No results found for this basename: PHART, PCO2, PO2ART, TCO2, HCO3,  in the last 72 hours CULTURES No results found for this or any previous visit (from the past 240 hour(s)). Studies/Results: Dg Chest 1 View  01/15/2014   CLINICAL DATA:  CHF  EXAM: CHEST - 1 VIEW  COMPARISON:  01/11/2014  FINDINGS: There are bilateral diffuse interstitial and alveolar airspace opacities. There are bilateral small pleural effusions. There is no pneumothorax. There is stable cardiomegaly. There is enlargement of  the central pulmonary vasculature. There is degenerative change of the right AC joint.  IMPRESSION: Overall findings consistent with pulmonary edema.   Electronically Signed   By: Kathreen Devoid   On: 01/15/2014 10:20    Medications: I have reviewed the patient's current medications.  Assesment: Active Problems:   Anemia Pulmonary edema  UTI  Diabetes mellitus  History of hypothyroidism  COPD Acute renal failure   Plan: Medications reviewed As per nephrology recommendation Continue current treatment Will monitor CBC/BMP     LOS: 11 days   [name] Green 01/17/2014, 7:48 AM

## 2014-01-17 NOTE — Progress Notes (Signed)
[name] Green  MRN: [id number]  DOB/AGE: 56-19-1957 56 y.o.  Primary Care Physician:FANTA,TESFAYE, MD  Admit date: 01/06/2014  Chief Complaint:  Chief Complaint  Patient presents with  . Altered Mental Status    S-Pt presented on  01/06/2014 with  Chief Complaint  Patient presents with  . Altered Mental Status  .    Pt offers no new complaints.    Pt only concern was " When Can I go home"   Meds . antiseptic oral rinse  15 mL Mouth Rinse BID  . antiseptic oral rinse  15 mL Mouth Rinse BID  . DULoxetine  20 mg Oral Daily  . folic acid  1 mg Oral Daily  . furosemide  40 mg Intravenous BID  . hydrocortisone   Rectal BID  . insulin aspart  0-20 Units Subcutaneous TID WC  . ipratropium-albuterol  3 mL Nebulization QID  . levothyroxine  200 mcg Oral QAC breakfast  . methylPREDNISolone (SOLU-MEDROL) injection  40 mg Intravenous Q6H  . nystatin cream   Topical BID  . pantoprazole  40 mg Oral BID AC  . PARoxetine  20 mg Oral Daily  . simvastatin  40 mg Oral q morning - 10a     Physical Exam: Vital signs in last 24 hours: Temp:  [97.7 F (36.5 C)-97.8 F (36.6 C)] 97.7 F (36.5 C) (08/05 0410) Pulse Rate:  [93-97] 93 (08/05 0410) Resp:  [16-20] 16 (08/05 0410) BP: (103-114)/(52-59) 103/58 mmHg (08/05 0410) SpO2:  [76 %-98 %] 96 % (08/05 0711) Weight change:  Last BM Date: 01/06/14  Intake/Output from previous day: 08/04 0701 - 08/05 0700 In: 240 [P.O.:240] Out: 1950 [Urine:1950]     Physical Exam: General- pt is awake,alert, oriented to time place and person Resp- No acute REsp distress, Decreased bs at bases CVS- S1S2 regular in rate and rhythm GIT- BS+, soft, NT, ND, Morbidly obese EXT- Trace LE Edema,NO  Cyanosis   Lab Results:  CBC    Component Value Date/Time   WBC 6.0 01/17/2014 0532   RBC 2.88* 01/17/2014 0532   RBC 3.28* 01/03/2013 2334   HGB 8.7* 01/17/2014 0532   HCT 28.4* 01/17/2014 0532   PLT 100* 01/17/2014 0532   MCV 98.6 01/17/2014 0532   MCH  30.2 01/17/2014 0532   MCHC 30.6 01/17/2014 0532   RDW 21.2* 01/17/2014 0532   LYMPHSABS 0.2* 01/14/2014 0413   MONOABS 0.2 01/14/2014 0413   EOSABS 0.0 01/14/2014 0413   BASOSABS 0.0 01/14/2014 0413       BMET  Recent Labs  01/16/14 1000 01/17/14 0532  NA 140 141  K 5.4* 5.1  CL 94* 97  CO2 38* 36*  GLUCOSE 180* 164*  BUN 76* 80*  CREATININE 2.53* 2.56*  CALCIUM 9.1 8.9     Trend Creat 2015  1.27=>2.69=>2.56 1.12=>2.47=>2.07=>1.63=>1.22( Last admission)  Trend Potassium 2015 5.4=> 5.1 6.1=>5.0=>3.9=>4.0( Last admission)    Lab Results  Component Value Date   CALCIUM 8.9 01/17/2014   PHOS 5.1* 01/17/2014       Impression: 1)Renal  AKI secondary to Prerenal/ATN                ATN plateau                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 Medication- On Diuretics  3)Anemia HGb stable  GI bleeding Received multiple PRBC  4)Hypothyroidism On Levothyroxine.  5)Chronic LE dema On Diuretics  Primary MD following  6)Electrolytes  Hyperkalemic- now better    7)Acid base Co2 at baseline  8) ?SLE - ANA Positive, Complement C4 low but C3 normal         Anti DS DNa an less than 1            Unlikely SLE      Plan:  Will continue current care    Jaia Alonge S 01/17/2014, 10:17 AM

## 2014-01-17 NOTE — Clinical Social Work Note (Signed)
Reported to CSW that pt is now agreeable to SNF. Met with pt who agreed to [facility] only. Will initiate bed search and follow up.  Benay Pike, [place]

## 2014-01-17 NOTE — Progress Notes (Signed)
Notified Dr. Juanetta GoslingHawkins about patient's right arm.  Arm appears to have 2+ pitting edema.  MD stated to keep using IV at this time and Dr. Felecia ShellingFanta will access in the AM.

## 2014-01-18 ENCOUNTER — Encounter (HOSPITAL_COMMUNITY): Payer: Medicaid Other

## 2014-01-18 LAB — GLUCOSE, CAPILLARY
GLUCOSE-CAPILLARY: 164 mg/dL — AB (ref 70–99)
GLUCOSE-CAPILLARY: 149 mg/dL — AB (ref 70–99)
Glucose-Capillary: 143 mg/dL — ABNORMAL HIGH (ref 70–99)
Glucose-Capillary: 188 mg/dL — ABNORMAL HIGH (ref 70–99)

## 2014-01-18 MED ORDER — SODIUM CHLORIDE 0.9 % IJ SOLN
10.0000 mL | INTRAMUSCULAR | Status: DC | PRN
  Administered 2014-01-18: 20 mL

## 2014-01-18 MED ORDER — DARBEPOETIN ALFA-POLYSORBATE 60 MCG/0.3ML IJ SOLN
60.0000 ug | INTRAMUSCULAR | Status: DC
  Administered 2014-01-18: 60 ug via SUBCUTANEOUS
  Filled 2014-01-18: qty 0.3

## 2014-01-18 MED ORDER — SODIUM CHLORIDE 0.9 % IJ SOLN
10.0000 mL | Freq: Two times a day (BID) | INTRAMUSCULAR | Status: DC
  Administered 2014-01-18 – 2014-01-21 (×8): 10 mL

## 2014-01-18 MED ORDER — TORSEMIDE 20 MG PO TABS
40.0000 mg | ORAL_TABLET | Freq: Every day | ORAL | Status: DC
  Administered 2014-01-18 – 2014-01-19 (×2): 40 mg via ORAL
  Filled 2014-01-18 (×2): qty 2

## 2014-01-18 NOTE — Progress Notes (Signed)
I discussed her situation with Dr. Felecia ShellingFanta. I am following more peripherally since she's doing very well with her respiratory status but she is still having trouble with renal failure. She apparently has poor IV access and I was called last night while I was on call and notified that she was swollen around her IV site. A PICC line was attempted this weekend but was unable to be placed I think she needs a PICC line

## 2014-01-18 NOTE — Progress Notes (Signed)
Assessed IV. Right arm swollen and IV infiltrated with no blood return. Removed IV with immediate fluid draining from arm. Per report arm had been swelling for a day or two. Placed new IV in left forefinger on 2nd attempt.

## 2014-01-18 NOTE — Clinical Social Work Note (Signed)
It was reported to CSW that pt no longer wanted to go to SNF at d/c. CSW confirmed this with pt who states she would rather return home. CSW signing off, but can be reconsulted if needed.  Derenda FennelKara Deshawn Witty, [place] [phone]

## 2014-01-18 NOTE — Progress Notes (Addendum)
Subjective: Interval History: has no complaint of difficulty in breathing. Patient denies any nausea or vomiting. She states that she's feeling much better..  Objective: Vital signs in last 24 hours: Temp:  [96.5 F (35.8 C)-98 F (36.7 C)] 96.6 F (35.9 C) (08/06 0622) Pulse Rate:  [72-98] 88 (08/06 0708) Resp:  [16-20] 16 (08/06 0708) BP: (116-119)/(57-69) 118/57 mmHg (08/06 0622) SpO2:  [92 %-100 %] 99 % (08/06 0708) FiO2 (%):  [35 %] 35 % (08/06 0707) Weight:  [168.1 kg (370 lb 9.5 oz)] 168.1 kg (370 lb 9.5 oz) (08/06 0622) Weight change:   Intake/Output from previous day: 08/05 0701 - 08/06 0700 In: 720 [P.O.:720] Out: 2200 [Urine:2200] Intake/Output this shift:    General appearance: alert, cooperative and no distress Resp: diminished breath sounds anterior - bilateral Cardio: regular rate and rhythm, S1, S2 normal, no murmur, click, rub or gallop GI: Obese, nontender Extremities: edema Possibly 1+ edema bilaterally  Lab Results:  Recent Labs  01/17/14 0532  WBC 6.0  HGB 8.7*  HCT 28.4*  PLT 100*   BMET:  Recent Labs  01/16/14 1000 01/17/14 0532  NA 140 141  K 5.4* 5.1  CL 94* 97  CO2 38* 36*  GLUCOSE 180* 164*  BUN 76* 80*  CREATININE 2.53* 2.56*  CALCIUM 9.1 8.9   No results found for this basename: PTH,  in the last 72 hours Iron Studies: No results found for this basename: IRON, TIBC, TRANSFERRIN, FERRITIN,  in the last 72 hours  Studies/Results: No results found.  I have reviewed the patient's current medications.  Assessment/Plan: Problem #1 renal failure: Acute on chronic. Her BUN and creatinine from yesterday was stable. Presently we will to get blood for basic metabolic panel.  Problem #2 hyperkalemia: Potassium has improved Problem #3 difficulty breathing: Presently improving. His combination of COPD and also CHF. Patient had was on 2 L of urine output. Problem #4 diabetes Problem #5 anemia: Her iron saturation and ferritin was normal  about 1 months ago. Hence anemia of chronic disease. Her hemoglobin hematocrit is low. Problem #6 metabolic bone disease: Calcium is range but phosphorus is slightly high is stable. Problem #7 history of hypothyroidism she is on Synthroid Plan: We'll DC her IV fluid We'll DC IV Lasix We'll start her on Demadex 40 mg by mouth once a day on when necessary basis. Patient may require [facility] for drawing blood or today for an IV  medication as presently they were not able  to do blood work. Basic metabolic panel Start patient on ARANESP 60 units sq once a week   LOS: 12 days   [name] Green S 01/18/2014,10:14 AM

## 2014-01-18 NOTE — Progress Notes (Signed)
Subjective: Patient is on BIPAP. She feels better. He BMP result is pending. Nephrology is following patient for her renal failure. Objective: Vital signs in last 24 hours: Temp:  [96.5 F (35.8 C)-98 F (36.7 C)] 96.6 F (35.9 C) (08/06 0622) Pulse Rate:  [72-98] 88 (08/06 0708) Resp:  [16-20] 16 (08/06 0708) BP: (116-119)/(57-69) 118/57 mmHg (08/06 0622) SpO2:  [92 %-100 %] 99 % (08/06 0708) FiO2 (%):  [35 %] 35 % (08/06 0707) Weight:  [168.1 kg (370 lb 9.5 oz)] 168.1 kg (370 lb 9.5 oz) (08/06 0622) Weight change:  Last BM Date: 01/06/14 (per report)  Intake/Output from previous day: 08/05 0701 - 08/06 0700 In: 720 [P.O.:720] Out: 2200 [Urine:2200]  PHYSICAL EXAM General appearance: alert and no distress Resp: diminished breath sounds bilaterally and rhonchi bilaterally Cardio: S1, S2 normal GI: soft, non-tender; bowel sounds normal; no masses,  no organomegaly Extremities: edema 2++  Lab Results:  Results for orders placed during the hospital encounter of 01/06/14 (from the past 48 hour(s))  COMPREHENSIVE METABOLIC PANEL     Status: Abnormal   Collection Time    01/16/14 10:00 AM      Result Value Ref Range   Sodium 140  137 - 147 mEq/L   Potassium 5.4 (*) 3.7 - 5.3 mEq/L   Chloride 94 (*) 96 - 112 mEq/L   CO2 38 (*) 19 - 32 mEq/L   Glucose, Bld 180 (*) 70 - 99 mg/dL   BUN 76 (*) 6 - 23 mg/dL   Creatinine, Ser 2.53 (*) 0.50 - 1.10 mg/dL   Calcium 9.1  8.4 - 10.5 mg/dL   Total Protein 6.5  6.0 - 8.3 g/dL   Albumin 2.7 (*) 3.5 - 5.2 g/dL   AST 30  0 - 37 U/L   ALT 15  0 - 35 U/L   Alkaline Phosphatase 114  39 - 117 U/L   Total Bilirubin 1.9 (*) 0.3 - 1.2 mg/dL   GFR calc non Af Amer 20 (*) >90 mL/min   GFR calc Af Amer 23 (*) >90 mL/min   Comment: (NOTE)     The eGFR has been calculated using the CKD EPI equation.     This calculation has not been validated in all clinical situations.     eGFR's persistently <90 mL/min signify possible Chronic Kidney      Disease.   Anion gap 8  5 - 15  ANTI-DNA ANTIBODY, DOUBLE-STRANDED     Status: None   Collection Time    01/16/14 10:00 AM      Result Value Ref Range   ds DNA Ab <1     Comment: (NOTE)                                  IU/mL       Interpretation                                  < or = 4    Negative                                  5-9         Indeterminate                                  >  or = 10   Positive     Performed at [facility]  ANTI-SMITH ANTIBODY     Status: None   Collection Time    01/16/14 10:00 AM      Result Value Ref Range   ENA SM Ab Ser-aCnc <1.0 NEG  <1.0 NEG AI   Comment: Performed at [facility], CAPILLARY     Status: Abnormal   Collection Time    01/16/14 11:23 AM      Result Value Ref Range   Glucose-Capillary 149 (*) 70 - 99 mg/dL   Comment 1 Notify RN    GLUCOSE, CAPILLARY     Status: Abnormal   Collection Time    01/16/14  4:49 PM      Result Value Ref Range   Glucose-Capillary 178 (*) 70 - 99 mg/dL   Comment 1 Notify RN     Comment 2 Documented in Chart    GLUCOSE, CAPILLARY     Status: Abnormal   Collection Time    01/16/14  9:44 PM      Result Value Ref Range   Glucose-Capillary 189 (*) 70 - 99 mg/dL   Comment 1 Notify RN    BASIC METABOLIC PANEL     Status: Abnormal   Collection Time    01/17/14  5:32 AM      Result Value Ref Range   Sodium 141  137 - 147 mEq/L   Potassium 5.1  3.7 - 5.3 mEq/L   Chloride 97  96 - 112 mEq/L   CO2 36 (*) 19 - 32 mEq/L   Glucose, Bld 164 (*) 70 - 99 mg/dL   BUN 80 (*) 6 - 23 mg/dL   Creatinine, Ser 2.56 (*) 0.50 - 1.10 mg/dL   Calcium 8.9  8.4 - 10.5 mg/dL   GFR calc non Af Amer 20 (*) >90 mL/min   GFR calc Af Amer 23 (*) >90 mL/min   Comment: (NOTE)     The eGFR has been calculated using the CKD EPI equation.     This calculation has not been validated in all clinical situations.     eGFR's persistently <90 mL/min signify possible Chronic Kidney     Disease.   Anion gap  8  5 - 15  CBC     Status: Abnormal   Collection Time    01/17/14  5:32 AM      Result Value Ref Range   WBC 6.0  4.0 - 10.5 K/uL   RBC 2.88 (*) 3.87 - 5.11 MIL/uL   Hemoglobin 8.7 (*) 12.0 - 15.0 g/dL   HCT 28.4 (*) 36.0 - 46.0 %   MCV 98.6  78.0 - 100.0 fL   MCH 30.2  26.0 - 34.0 pg   MCHC 30.6  30.0 - 36.0 g/dL   RDW 21.2 (*) 11.5 - 15.5 %   Platelets 100 (*) 150 - 400 K/uL   Comment: SPECIMEN CHECKED FOR CLOTS     PLATELET COUNT CONFIRMED BY SMEAR  PHOSPHORUS     Status: Abnormal   Collection Time    01/17/14  5:32 AM      Result Value Ref Range   Phosphorus 5.1 (*) 2.3 - 4.6 mg/dL  GLUCOSE, CAPILLARY     Status: Abnormal   Collection Time    01/17/14  7:48 AM      Result Value Ref Range   Glucose-Capillary 154 (*) 70 - 99 mg/dL   Comment 1 Notify RN  Comment 2 Documented in Chart    GLUCOSE, CAPILLARY     Status: Abnormal   Collection Time    01/17/14 11:12 AM      Result Value Ref Range   Glucose-Capillary 166 (*) 70 - 99 mg/dL  GLUCOSE, CAPILLARY     Status: Abnormal   Collection Time    01/17/14  4:50 PM      Result Value Ref Range   Glucose-Capillary 139 (*) 70 - 99 mg/dL   Comment 1 Documented in Chart     Comment 2 Notify RN    GLUCOSE, CAPILLARY     Status: Abnormal   Collection Time    01/17/14  9:47 PM      Result Value Ref Range   Glucose-Capillary 162 (*) 70 - 99 mg/dL   Comment 1 Notify RN     Comment 2 Documented in Chart    GLUCOSE, CAPILLARY     Status: Abnormal   Collection Time    01/18/14  7:45 AM      Result Value Ref Range   Glucose-Capillary 164 (*) 70 - 99 mg/dL   Comment 1 Notify RN      ABGS No results found for this basename: PHART, PCO2, PO2ART, TCO2, HCO3,  in the last 72 hours CULTURES No results found for this or any previous visit (from the past 240 hour(s)). Studies/Results: No results found.  Medications: I have reviewed the patient's current medications.  Assesment: Active Problems:   Anemia Pulmonary edema   UTI  Diabetes mellitus  History of hypothyroidism  COPD Acute renal failure   Plan: Medications reviewed As per nephrology recommendation Continue current treatment Will monitor CBC/BMP     LOS: 12 days   [name] Green 01/18/2014, 7:59 AM

## 2014-01-19 LAB — GLUCOSE, CAPILLARY
GLUCOSE-CAPILLARY: 222 mg/dL — AB (ref 70–99)
Glucose-Capillary: 152 mg/dL — ABNORMAL HIGH (ref 70–99)
Glucose-Capillary: 140 mg/dL — ABNORMAL HIGH (ref 70–99)
Glucose-Capillary: 211 mg/dL — ABNORMAL HIGH (ref 70–99)

## 2014-01-19 LAB — BASIC METABOLIC PANEL
Anion gap: 9 (ref 5–15)
BUN: 84 mg/dL — AB (ref 6–23)
CHLORIDE: 99 meq/L (ref 96–112)
CO2: 36 mEq/L — ABNORMAL HIGH (ref 19–32)
Calcium: 8.8 mg/dL (ref 8.4–10.5)
Creatinine, Ser: 2.22 mg/dL — ABNORMAL HIGH (ref 0.50–1.10)
GFR calc Af Amer: 27 mL/min — ABNORMAL LOW (ref >90)
GFR, EST NON AFRICAN AMERICAN: 24 mL/min — AB (ref >90)
GLUCOSE: 185 mg/dL — AB (ref 70–99)
POTASSIUM: 4.5 meq/L (ref 3.7–5.3)
Sodium: 144 mEq/L (ref 137–147)

## 2014-01-19 MED ORDER — IPRATROPIUM-ALBUTEROL 0.5-2.5 (3) MG/3ML IN SOLN
3.0000 mL | Freq: Three times a day (TID) | RESPIRATORY_TRACT | Status: DC
  Administered 2014-01-19 – 2014-01-22 (×8): 3 mL via RESPIRATORY_TRACT
  Filled 2014-01-19 (×9): qty 3

## 2014-01-19 MED ORDER — FLEET ENEMA 7-19 GM/118ML RE ENEM
1.0000 | ENEMA | Freq: Once | RECTAL | Status: AC
  Administered 2014-01-20: 1 via RECTAL

## 2014-01-19 MED ORDER — MAGIC MOUTHWASH
5.0000 mL | Freq: Three times a day (TID) | ORAL | Status: DC
  Administered 2014-01-19 – 2014-01-22 (×10): 5 mL via ORAL
  Filled 2014-01-19 (×10): qty 5

## 2014-01-19 MED ORDER — POLYETHYLENE GLYCOL 3350 17 G PO PACK
17.0000 g | PACK | ORAL | Status: AC
  Administered 2014-01-19 (×4): 17 g via ORAL
  Filled 2014-01-19 (×4): qty 1

## 2014-01-19 MED ORDER — POLYETHYLENE GLYCOL 3350 17 G PO PACK
17.0000 g | PACK | Freq: Every day | ORAL | Status: DC
  Administered 2014-01-19: 17 g via ORAL
  Filled 2014-01-19: qty 1

## 2014-01-19 MED ORDER — POLYETHYLENE GLYCOL 3350 17 G PO PACK
17.0000 g | PACK | Freq: Two times a day (BID) | ORAL | Status: DC
  Administered 2014-01-19 – 2014-01-21 (×5): 17 g via ORAL
  Filled 2014-01-19 (×7): qty 1

## 2014-01-19 NOTE — Progress Notes (Signed)
Subjective: Patient is resting. She feels better. However, her BUN is rising and now it 84. Nephrology is following her and she started on PO demodex. Objective: Vital signs in last 24 hours: Temp:  [97 F (36.1 C)-97.4 F (36.3 C)] 97.4 F (36.3 C) (08/07 0450) Pulse Rate:  [85-94] 88 (08/07 0450) Resp:  [17-20] 20 (08/07 0450) BP: (114-122)/(60-62) 115/62 mmHg (08/07 0450) SpO2:  [3 %-100 %] 96 % (08/07 0716) Weight change:  Last BM Date: 01/06/14  Intake/Output from previous day: 08/06 0701 - 08/07 0700 In: 240 [P.O.:240] Out: 500 [Urine:500]  PHYSICAL EXAM General appearance: alert and no distress Resp: diminished breath sounds bilaterally and rhonchi bilaterally Cardio: S1, S2 normal GI: soft, non-tender; bowel sounds normal; no masses,  no organomegaly Extremities: edema 2++  Lab Results:  Results for orders placed during the hospital encounter of 01/06/14 (from the past 48 hour(s))  GLUCOSE, CAPILLARY     Status: Abnormal   Collection Time    01/17/14 11:12 AM      Result Value Ref Range   Glucose-Capillary 166 (*) 70 - 99 mg/dL  GLUCOSE, CAPILLARY     Status: Abnormal   Collection Time    01/17/14  4:50 PM      Result Value Ref Range   Glucose-Capillary 139 (*) 70 - 99 mg/dL   Comment 1 Documented in Chart     Comment 2 Notify RN    GLUCOSE, CAPILLARY     Status: Abnormal   Collection Time    01/17/14  9:47 PM      Result Value Ref Range   Glucose-Capillary 162 (*) 70 - 99 mg/dL   Comment 1 Notify RN     Comment 2 Documented in Chart    GLUCOSE, CAPILLARY     Status: Abnormal   Collection Time    01/18/14  7:45 AM      Result Value Ref Range   Glucose-Capillary 164 (*) 70 - 99 mg/dL   Comment 1 Notify RN    GLUCOSE, CAPILLARY     Status: Abnormal   Collection Time    01/18/14 12:41 PM      Result Value Ref Range   Glucose-Capillary 143 (*) 70 - 99 mg/dL  GLUCOSE, CAPILLARY     Status: Abnormal   Collection Time    01/18/14  5:42 PM      Result  Value Ref Range   Glucose-Capillary 149 (*) 70 - 99 mg/dL   Comment 1 Notify RN     Comment 2 Documented in Chart    GLUCOSE, CAPILLARY     Status: Abnormal   Collection Time    01/18/14  9:14 PM      Result Value Ref Range   Glucose-Capillary 188 (*) 70 - 99 mg/dL   Comment 1 Notify RN     Comment 2 Documented in Chart    BASIC METABOLIC PANEL     Status: Abnormal   Collection Time    01/19/14  6:16 AM      Result Value Ref Range   Sodium 144  137 - 147 mEq/L   Potassium 4.5  3.7 - 5.3 mEq/L   Chloride 99  96 - 112 mEq/L   CO2 36 (*) 19 - 32 mEq/L   Glucose, Bld 185 (*) 70 - 99 mg/dL   BUN 84 (*) 6 - 23 mg/dL   Creatinine, Ser 2.22 (*) 0.50 - 1.10 mg/dL   Calcium 8.8  8.4 - 10.5 mg/dL  GFR calc non Af Amer 24 (*) >90 mL/min   GFR calc Af Amer 27 (*) >90 mL/min   Comment: (NOTE)     The eGFR has been calculated using the CKD EPI equation.     This calculation has not been validated in all clinical situations.     eGFR's persistently <90 mL/min signify possible Chronic Kidney     Disease.   Anion gap 9  5 - 15  GLUCOSE, CAPILLARY     Status: Abnormal   Collection Time    01/19/14  7:17 AM      Result Value Ref Range   Glucose-Capillary 152 (*) 70 - 99 mg/dL    ABGS No results found for this basename: PHART, PCO2, PO2ART, TCO2, HCO3,  in the last 72 hours CULTURES No results found for this or any previous visit (from the past 240 hour(s)). Studies/Results: No results found.  Medications: I have reviewed the patient's current medications.  Assesment: Active Problems:   Anemia Pulmonary edema  UTI  Diabetes mellitus  History of hypothyroidism  COPD Acute renal failure   Plan: Medications reviewed As per nephrology recommendation Continue current treatment Will monitor CBC/BMP     LOS: 13 days   Kalik Hoare 01/19/2014, 7:54 AM

## 2014-01-19 NOTE — Progress Notes (Signed)
Subjective: Interval History: has no complaint of difficulty in breathing. Patient does not have any new complaints.  Objective: Vital signs in last 24 hours: Temp:  [97 F (36.1 C)-97.4 F (36.3 C)] 97.4 F (36.3 C) (08/07 0450) Pulse Rate:  [85-94] 89 (08/07 0929) Resp:  [17-20] 17 (08/07 0929) BP: (114-122)/(60-62) 115/62 mmHg (08/07 0450) SpO2:  [3 %-100 %] 95 % (08/07 0929) Weight change:   Intake/Output from previous day: 08/06 0701 - 08/07 0700 In: 240 [P.O.:240] Out: 500 [Urine:500] Intake/Output this shift:    Generally patient is alert no apparent distress. Chest: Decreased breath sound Heart exam revealed regular rate and rhythm Abdomen: Obase nontender Extremities she has 1-2+ edema bilaterally  La he he hb Results:  Recent Labs  01/17/14 0532  WBC 6.0  HGB 8.7*  HCT 28.4*  PLT 100*   BMET:   Recent Labs  01/17/14 0532 01/19/14 0616  NA 141 144  K 5.1 4.5  CL 97 99  CO2 36* 36*  GLUCOSE 164* 185*  BUN 80* 84*  CREATININE 2.56* 2.22*  CALCIUM 8.9 8.8   No results found for this basename: PTH,  in the last 72 hours Iron Studies: No results found for this basename: IRON, TIBC, TRANSFERRIN, FERRITIN,  in the last 72 hours  Studies/Results: No results found.  I have reviewed the patient's current medications.  Assessment/Plan: Problem #1 renal failure: Acute on chronic. Her BUN and creatinine is improving. Presently she doesn't have any uremic sinus symptoms.  Problem #2 hyperkalemia: Potassium has improved Problem #3 difficulty breathing: Presently improving. His combination of COPD and also CHF.presently started on Demadex she is none oliguric and feeling better.  Problem #4 diabetes Problem #5 anemia: patient was started on erythropoietin  Problem #6 metabolic bone disease: Calcium is range but phosphorus is slightly high is stable. Problem #7 history of hypothyroidism she is on Synthroid Plan: We'll continue his present  management. Since her renal function is improving is okay to discharge patient renal point of view.   LOS: 13 days   [place] S 01/19/2014,10:15 AM

## 2014-01-19 NOTE — Progress Notes (Signed)
REVIEWED. AGREE. 

## 2014-01-19 NOTE — Progress Notes (Signed)
Subjective: She says she feels okay. I have been following her more peripherally because she has done much better as far as her respiratory status is concerned but I will be assuming her care for the next 2 days while Dr. Legrand Rams is out of town. I discussed her situation with the patient and her sister. She still has problems with renal failure which is what is keeping her in the hospital at this point wants an increased dose of nerve medication I told her that we can't do that because of her respiratory problem  Objective: Vital signs in last 24 hours: Temp:  [97 F (36.1 C)-97.4 F (36.3 C)] 97.4 F (36.3 C) (08/07 0450) Pulse Rate:  [85-94] 88 (08/07 0450) Resp:  [17-20] 20 (08/07 0450) BP: (114-122)/(60-62) 115/62 mmHg (08/07 0450) SpO2:  [3 %-100 %] 96 % (08/07 0716) Weight change:  Last BM Date: 01/06/14  Intake/Output from previous day: 08/06 0701 - 08/07 0700 In: 240 [P.O.:240] Out: 500 [Urine:500]  PHYSICAL EXAM General appearance: alert and morbidly obese Resp: Diminished breath sounds bilaterally Cardio: regular rate and rhythm, S1, S2 normal, no murmur, click, rub or gallop GI: soft, non-tender; bowel sounds normal; no masses,  no organomegaly Extremities: She still has 2+ edema of her legs  Lab Results:  Results for orders placed during the hospital encounter of 01/06/14 (from the past 48 hour(s))  GLUCOSE, CAPILLARY     Status: Abnormal   Collection Time    01/17/14 11:12 AM      Result Value Ref Range   Glucose-Capillary 166 (*) 70 - 99 mg/dL  GLUCOSE, CAPILLARY     Status: Abnormal   Collection Time    01/17/14  4:50 PM      Result Value Ref Range   Glucose-Capillary 139 (*) 70 - 99 mg/dL   Comment 1 Documented in Chart     Comment 2 Notify RN    GLUCOSE, CAPILLARY     Status: Abnormal   Collection Time    01/17/14  9:47 PM      Result Value Ref Range   Glucose-Capillary 162 (*) 70 - 99 mg/dL   Comment 1 Notify RN     Comment 2 Documented in Chart     GLUCOSE, CAPILLARY     Status: Abnormal   Collection Time    01/18/14  7:45 AM      Result Value Ref Range   Glucose-Capillary 164 (*) 70 - 99 mg/dL   Comment 1 Notify RN    GLUCOSE, CAPILLARY     Status: Abnormal   Collection Time    01/18/14 12:41 PM      Result Value Ref Range   Glucose-Capillary 143 (*) 70 - 99 mg/dL  GLUCOSE, CAPILLARY     Status: Abnormal   Collection Time    01/18/14  5:42 PM      Result Value Ref Range   Glucose-Capillary 149 (*) 70 - 99 mg/dL   Comment 1 Notify RN     Comment 2 Documented in Chart    GLUCOSE, CAPILLARY     Status: Abnormal   Collection Time    01/18/14  9:14 PM      Result Value Ref Range   Glucose-Capillary 188 (*) 70 - 99 mg/dL   Comment 1 Notify RN     Comment 2 Documented in Chart    BASIC METABOLIC PANEL     Status: Abnormal   Collection Time    01/19/14  6:16 AM  Result Value Ref Range   Sodium 144  137 - 147 mEq/L   Potassium 4.5  3.7 - 5.3 mEq/L   Chloride 99  96 - 112 mEq/L   CO2 36 (*) 19 - 32 mEq/L   Glucose, Bld 185 (*) 70 - 99 mg/dL   BUN 84 (*) 6 - 23 mg/dL   Creatinine, Ser 2.22 (*) 0.50 - 1.10 mg/dL   Calcium 8.8  8.4 - 10.5 mg/dL   GFR calc non Af Amer 24 (*) >90 mL/min   GFR calc Af Amer 27 (*) >90 mL/min   Comment: (NOTE)     The eGFR has been calculated using the CKD EPI equation.     This calculation has not been validated in all clinical situations.     eGFR's persistently <90 mL/min signify possible Chronic Kidney     Disease.   Anion gap 9  5 - 15  GLUCOSE, CAPILLARY     Status: Abnormal   Collection Time    01/19/14  7:17 AM      Result Value Ref Range   Glucose-Capillary 152 (*) 70 - 99 mg/dL    ABGS No results found for this basename: PHART, PCO2, PO2ART, TCO2, HCO3,  in the last 72 hours CULTURES No results found for this or any previous visit (from the past 240 hour(s)). Studies/Results: No results found.  Medications:  Prior to Admission:  Prescriptions prior to admission   Medication Sig Dispense Refill  . albuterol (PROVENTIL) (2.5 MG/3ML) 0.083% nebulizer solution Take 3 mLs (2.5 mg total) by nebulization every 4 (four) hours as needed for wheezing or shortness of breath.  75 mL  12  . aspirin EC 81 MG EC tablet Take 1 tablet (81 mg total) by mouth daily.  30 tablet  12  . clonazePAM (KLONOPIN) 1 MG tablet Take 0.5 tablets (0.5 mg total) by mouth 4 (four) times daily as needed for anxiety.  30 tablet  0  . DULoxetine (CYMBALTA) 20 MG capsule Take 1 capsule (20 mg total) by mouth daily.  30 capsule  3  . folic acid (FOLVITE) 1 MG tablet Take 1 tablet (1 mg total) by mouth daily.  30 tablet  12  . furosemide (LASIX) 40 MG tablet Take 1 tablet (40 mg total) by mouth daily.  30 tablet  1  . HYDROcodone-acetaminophen (NORCO/VICODIN) 5-325 MG per tablet Take 1 tablet by mouth every 4 (four) hours as needed for moderate pain.      Marland Kitchen insulin lispro (HUMALOG) 100 UNIT/ML injection Inject 2-10 Units into the skin 3 (three) times daily before meals. SS      . levothyroxine (SYNTHROID, LEVOTHROID) 200 MCG tablet Take 200 mcg by mouth daily before breakfast.      . lisinopril (PRINIVIL,ZESTRIL) 5 MG tablet Take 1 tablet (5 mg total) by mouth daily.  30 tablet  1  . PARoxetine (PAXIL) 20 MG tablet Take 20 mg by mouth every morning.      . potassium chloride SA (K-DUR,KLOR-CON) 20 MEQ tablet Take 40 mEq by mouth daily.      . promethazine (PHENERGAN) 25 MG tablet Take 25 mg by mouth every 6 (six) hours as needed for nausea or vomiting.      . simvastatin (ZOCOR) 40 MG tablet Take 40 mg by mouth every morning.       . temazepam (RESTORIL) 15 MG capsule Take 15 mg by mouth at bedtime as needed for sleep.       Scheduled: .  antiseptic oral rinse  15 mL Mouth Rinse BID  . antiseptic oral rinse  15 mL Mouth Rinse BID  . darbepoetin  60 mcg Subcutaneous Q7 days  . DULoxetine  20 mg Oral Daily  . folic acid  1 mg Oral Daily  . hydrocortisone   Rectal BID  . insulin aspart  0-20  Units Subcutaneous TID WC  . ipratropium-albuterol  3 mL Nebulization QID  . levothyroxine  200 mcg Oral QAC breakfast  . methylPREDNISolone (SOLU-MEDROL) injection  40 mg Intravenous Q6H  . nystatin cream   Topical BID  . pantoprazole  40 mg Oral BID AC  . PARoxetine  20 mg Oral Daily  . simvastatin  40 mg Oral q morning - 10a  . sodium chloride  10-40 mL Intracatheter Q12H  . torsemide  40 mg Oral Daily   Continuous: . sodium chloride 10 mL/hr at 01/14/14 1900   JQZ:ESPQZRAQT, benzonatate, clonazePAM, HYDROcodone-acetaminophen, ondansetron (ZOFRAN) IV, ondansetron, sodium chloride, sodium chloride  Assesment: She was admitted with anemia and developed congestive heart failure from volume replacement and blood transfusions. She developed acute on chronic respiratory failure and required BiPAP. She has improved as far as her respiratory status is concerned but developed renal failure I thin because of diuresis. Her renal function has not returned to baseline.k Active Problems:   Anemia    Plan:Continue current treatments. She is not ready for discharge    LOS: 13 days   [name] Green L 01/19/2014, 8:56 AM

## 2014-01-19 NOTE — Progress Notes (Signed)
    Subjective: Complains of sores in mouth. No BM since July 25. No overt signs of GI bleeding.    Objective: Vital signs in last 24 hours: Temp:  [97 F (36.1 C)-97.4 F (36.3 C)] 97.4 F (36.3 C) (08/07 0450) Pulse Rate:  [85-94] 89 (08/07 0929) Resp:  [17-20] 17 (08/07 0929) BP: (114-122)/(60-62) 115/62 mmHg (08/07 0450) SpO2:  [3 %-100 %] 95 % (08/07 0929) Last BM Date: 01/06/14 (Prescribing Miralax - Dr. informed) General:   Alert and oriented, pleasant Head:  Normocephalic and atraumatic. Mouth: tongue with raised sores, Abdomen:  Bowel sounds present, soft, obese, non-tender. Large panniculus. Anasarca. Neurologic:  Alert and  oriented x4;  grossly normal neurologically. Skin:  Warm and dry, intact without significant lesions.  Cervical Nodes:  No significant cervical adenopathy. Psych:  Alert and cooperative. Normal mood and affect.  Intake/Output from previous day: 08/06 0701 - 08/07 0700 In: 240 [P.O.:240] Out: 500 [Urine:500] Intake/Output this shift:    Lab Results:  Recent Labs  01/17/14 0532  WBC 6.0  HGB 8.7*  HCT 28.4*  PLT 100*   BMET  Recent Labs  01/17/14 0532 01/19/14 0616  NA 141 144  K 5.1 4.5  CL 97 99  CO2 36* 36*  GLUCOSE 164* 185*  BUN 80* 84*  CREATININE 2.56* 2.22*  CALCIUM 8.9 8.8    Assessment: 58 year old female with multiple medical issues with heme positive stool and anemia. 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 Anemia multifactorial secondary to gastritis, Dieluafoy's lesion, chronic disease. Low-volume hematochezia noted in past improved with anusol. Will need outpatient colonoscopy in a more elective setting.   Constipation: No BM since July 25. Start Miralax BID, adjust as needed.    Plan: Follow-up on pending biopsy BID PPI Colonoscopy in 2 months as outpatient Miralax BID for constipation Magic mouthwash Follow peripherally from GI standpoint.  Nira RetortAnna W. Sams, ANP-BC [facility]    LOS:  13 days    01/19/2014, 11:59 AM

## 2014-01-19 NOTE — Plan of Care (Signed)
Pt's sister states that pt has sores in mouth and is unable to eat.  Pt has been told that magic mouth wash (on third shift) has been ordered.  I saw no orders and have paged dr to get orders.  Pt also requesting something for BM, GI notified and orders will be placed.

## 2014-01-20 LAB — BLOOD GAS, ARTERIAL
Acid-Base Excess: 12.4 mmol/L — ABNORMAL HIGH (ref 0.0–2.0)
Bicarbonate: 37.4 mEq/L — ABNORMAL HIGH (ref 20.0–24.0)
DRAWN BY: 317771
O2 Content: 3 L/min
O2 Saturation: 95 %
Patient temperature: 37
TCO2: 34.6 mmol/L (ref 0–100)
pCO2 arterial: 57.6 mmHg (ref 35.0–45.0)
pH, Arterial: 7.428 (ref 7.350–7.450)
pO2, Arterial: 75.5 mmHg — ABNORMAL LOW (ref 80.0–100.0)

## 2014-01-20 LAB — BASIC METABOLIC PANEL
ANION GAP: 10 (ref 5–15)
BUN: 85 mg/dL — ABNORMAL HIGH (ref 6–23)
CHLORIDE: 98 meq/L (ref 96–112)
CO2: 36 mEq/L — ABNORMAL HIGH (ref 19–32)
Calcium: 8.9 mg/dL (ref 8.4–10.5)
Creatinine, Ser: 2.08 mg/dL — ABNORMAL HIGH (ref 0.50–1.10)
GFR calc Af Amer: 30 mL/min — ABNORMAL LOW (ref >90)
GFR calc non Af Amer: 25 mL/min — ABNORMAL LOW (ref >90)
Glucose, Bld: 185 mg/dL — ABNORMAL HIGH (ref 70–99)
Potassium: 4.4 mEq/L (ref 3.7–5.3)
SODIUM: 144 meq/L (ref 137–147)

## 2014-01-20 LAB — GLUCOSE, CAPILLARY
GLUCOSE-CAPILLARY: 161 mg/dL — AB (ref 70–99)
Glucose-Capillary: 161 mg/dL — ABNORMAL HIGH (ref 70–99)
Glucose-Capillary: 176 mg/dL — ABNORMAL HIGH (ref 70–99)
Glucose-Capillary: 174 mg/dL — ABNORMAL HIGH (ref 70–99)

## 2014-01-20 MED ORDER — PREDNISONE 20 MG PO TABS
20.0000 mg | ORAL_TABLET | Freq: Every day | ORAL | Status: DC
  Administered 2014-01-20 – 2014-01-22 (×3): 20 mg via ORAL
  Filled 2014-01-20 (×4): qty 1

## 2014-01-20 MED ORDER — TORSEMIDE 20 MG PO TABS
60.0000 mg | ORAL_TABLET | Freq: Every day | ORAL | Status: DC
  Administered 2014-01-20 – 2014-01-21 (×2): 60 mg via ORAL
  Filled 2014-01-20 (×3): qty 3

## 2014-01-20 NOTE — Progress Notes (Signed)
Dr. Janna Archondiego contacted and informed of the patient's ABG. -no orders received at this time.

## 2014-01-20 NOTE — Progress Notes (Signed)
Subjective: She apparently had a difficult day yesterday with some family conflict. She slept late this morning but now is awake and alert. She is off BiPAP. She has no other new complaints. She was under the impression that she was going to be discharged today but her renal function is still not back to baseline and I think it's too early for her to go.  Objective: Vital signs in last 24 hours: Temp:  [96.3 F (35.7 C)-97.6 F (36.4 C)] 97.5 F (36.4 C) (08/08 0700) Pulse Rate:  [91-98] 91 (08/08 0719) Resp:  [12-22] 12 (08/08 0719) BP: (108-138)/(53-65) 135/65 mmHg (08/08 0700) SpO2:  [90 %-97 %] 94 % (08/08 0719) FiO2 (%):  [35 %] 35 % (08/08 0719) Weight change:  Last BM Date: 01/06/14 (miralax and fleets enema)  Intake/Output from previous day: 08/07 0701 - 08/08 0700 In: 840 [P.O.:840] Out: 1000 [Urine:1000]  PHYSICAL EXAM General appearance: alert, cooperative, moderate distress and morbidly obese Resp: clear to auscultation bilaterally Cardio: regular rate and rhythm, S1, S2 normal, no murmur, click, rub or gallop GI: soft, non-tender; bowel sounds normal; no masses,  no organomegaly Extremities: 2+ edema  Lab Results:  Results for orders placed during the hospital encounter of 01/06/14 (from the past 48 hour(s))  GLUCOSE, CAPILLARY     Status: Abnormal   Collection Time    01/18/14 12:41 PM      Result Value Ref Range   Glucose-Capillary 143 (*) 70 - 99 mg/dL  GLUCOSE, CAPILLARY     Status: Abnormal   Collection Time    01/18/14  5:42 PM      Result Value Ref Range   Glucose-Capillary 149 (*) 70 - 99 mg/dL   Comment 1 Notify RN     Comment 2 Documented in Chart    GLUCOSE, CAPILLARY     Status: Abnormal   Collection Time    01/18/14  9:14 PM      Result Value Ref Range   Glucose-Capillary 188 (*) 70 - 99 mg/dL   Comment 1 Notify RN     Comment 2 Documented in Chart    BASIC METABOLIC PANEL     Status: Abnormal   Collection Time    01/19/14  6:16 AM   Result Value Ref Range   Sodium 144  137 - 147 mEq/L   Potassium 4.5  3.7 - 5.3 mEq/L   Chloride 99  96 - 112 mEq/L   CO2 36 (*) 19 - 32 mEq/L   Glucose, Bld 185 (*) 70 - 99 mg/dL   BUN 84 (*) 6 - 23 mg/dL   Creatinine, Ser 2.22 (*) 0.50 - 1.10 mg/dL   Calcium 8.8  8.4 - 10.5 mg/dL   GFR calc non Af Amer 24 (*) >90 mL/min   GFR calc Af Amer 27 (*) >90 mL/min   Comment: (NOTE)     The eGFR has been calculated using the CKD EPI equation.     This calculation has not been validated in all clinical situations.     eGFR's persistently <90 mL/min signify possible Chronic Kidney     Disease.   Anion gap 9  5 - 15  GLUCOSE, CAPILLARY     Status: Abnormal   Collection Time    01/19/14  7:17 AM      Result Value Ref Range   Glucose-Capillary 152 (*) 70 - 99 mg/dL  GLUCOSE, CAPILLARY     Status: Abnormal   Collection Time    01/19/14 11:24  AM      Result Value Ref Range   Glucose-Capillary 140 (*) 70 - 99 mg/dL  GLUCOSE, CAPILLARY     Status: Abnormal   Collection Time    01/19/14  4:03 PM      Result Value Ref Range   Glucose-Capillary 222 (*) 70 - 99 mg/dL  GLUCOSE, CAPILLARY     Status: Abnormal   Collection Time    01/19/14  8:58 PM      Result Value Ref Range   Glucose-Capillary 211 (*) 70 - 99 mg/dL   Comment 1 Notify RN     Comment 2 Documented in Chart    BASIC METABOLIC PANEL     Status: Abnormal   Collection Time    01/20/14  6:43 AM      Result Value Ref Range   Sodium 144  137 - 147 mEq/L   Potassium 4.4  3.7 - 5.3 mEq/L   Chloride 98  96 - 112 mEq/L   CO2 36 (*) 19 - 32 mEq/L   Glucose, Bld 185 (*) 70 - 99 mg/dL   BUN 85 (*) 6 - 23 mg/dL   Creatinine, Ser 2.08 (*) 0.50 - 1.10 mg/dL   Calcium 8.9  8.4 - 10.5 mg/dL   GFR calc non Af Amer 25 (*) >90 mL/min   GFR calc Af Amer 30 (*) >90 mL/min   Comment: (NOTE)     The eGFR has been calculated using the CKD EPI equation.     This calculation has not been validated in all clinical situations.     eGFR's  persistently <90 mL/min signify possible Chronic Kidney     Disease.   Anion gap 10  5 - 15  GLUCOSE, CAPILLARY     Status: Abnormal   Collection Time    01/20/14  7:58 AM      Result Value Ref Range   Glucose-Capillary 161 (*) 70 - 99 mg/dL   Comment 1 Notify RN      ABGS No results found for this basename: PHART, PCO2, PO2ART, TCO2, HCO3,  in the last 72 hours CULTURES No results found for this or any previous visit (from the past 240 hour(s)). Studies/Results: No results found.  Medications:  Prior to Admission:  Prescriptions prior to admission  Medication Sig Dispense Refill  . albuterol (PROVENTIL) (2.5 MG/3ML) 0.083% nebulizer solution Take 3 mLs (2.5 mg total) by nebulization every 4 (four) hours as needed for wheezing or shortness of breath.  75 mL  12  . aspirin EC 81 MG EC tablet Take 1 tablet (81 mg total) by mouth daily.  30 tablet  12  . clonazePAM (KLONOPIN) 1 MG tablet Take 0.5 tablets (0.5 mg total) by mouth 4 (four) times daily as needed for anxiety.  30 tablet  0  . DULoxetine (CYMBALTA) 20 MG capsule Take 1 capsule (20 mg total) by mouth daily.  30 capsule  3  . folic acid (FOLVITE) 1 MG tablet Take 1 tablet (1 mg total) by mouth daily.  30 tablet  12  . furosemide (LASIX) 40 MG tablet Take 1 tablet (40 mg total) by mouth daily.  30 tablet  1  . HYDROcodone-acetaminophen (NORCO/VICODIN) 5-325 MG per tablet Take 1 tablet by mouth every 4 (four) hours as needed for moderate pain.      Marland Kitchen insulin lispro (HUMALOG) 100 UNIT/ML injection Inject 2-10 Units into the skin 3 (three) times daily before meals. SS      . levothyroxine (  SYNTHROID, LEVOTHROID) 200 MCG tablet Take 200 mcg by mouth daily before breakfast.      . lisinopril (PRINIVIL,ZESTRIL) 5 MG tablet Take 1 tablet (5 mg total) by mouth daily.  30 tablet  1  . PARoxetine (PAXIL) 20 MG tablet Take 20 mg by mouth every morning.      . potassium chloride SA (K-DUR,KLOR-CON) 20 MEQ tablet Take 40 mEq by mouth daily.       . promethazine (PHENERGAN) 25 MG tablet Take 25 mg by mouth every 6 (six) hours as needed for nausea or vomiting.      . simvastatin (ZOCOR) 40 MG tablet Take 40 mg by mouth every morning.       . temazepam (RESTORIL) 15 MG capsule Take 15 mg by mouth at bedtime as needed for sleep.       Scheduled: . antiseptic oral rinse  15 mL Mouth Rinse BID  . antiseptic oral rinse  15 mL Mouth Rinse BID  . darbepoetin  60 mcg Subcutaneous Q7 days  . DULoxetine  20 mg Oral Daily  . folic acid  1 mg Oral Daily  . hydrocortisone   Rectal BID  . insulin aspart  0-20 Units Subcutaneous TID WC  . ipratropium-albuterol  3 mL Nebulization TID  . levothyroxine  200 mcg Oral QAC breakfast  . magic mouthwash  5 mL Oral TID  . nystatin cream   Topical BID  . pantoprazole  40 mg Oral BID AC  . PARoxetine  20 mg Oral Daily  . polyethylene glycol  17 g Oral BID  . predniSONE  20 mg Oral Q breakfast  . simvastatin  40 mg Oral q morning - 10a  . sodium chloride  10-40 mL Intracatheter Q12H  . torsemide  60 mg Oral Daily   Continuous: . sodium chloride 10 mL/hr at 01/14/14 1900   BWG:[id number], benzonatate, clonazePAM, HYDROcodone-acetaminophen, ondansetron (ZOFRAN) IV, ondansetron, sodium chloride, sodium chloride  Assesment: She was admitted with anemia. She received blood and volume resuscitation and developed pulmonary edema and respiratory failure from that. She was managed with BiPAP and did not require intubation and mechanical ventilation. She was treated with diuresis but then developed acute kidney injury and her renal function has not gone back to baseline yet. At her baseline she has significant peripheral edema. She is morbidly obese. She has some element of COPD and obesity hypoventilation and is requiring BiPAP at night. Has a history of abnormalities of her liver and apparently has had a previous episode of hepatic encephalopathy. Active Problems:   Anemia    Plan: No change in treatments  except on going to switch her from intravenous steroids to oral steroids. Continue BiPAP at night. Continue treatments per nephrology. I do not think she's ready to go home    LOS: 14 days   Minnetta Sandora L 01/20/2014, 9:38 AM

## 2014-01-20 NOTE — Progress Notes (Addendum)
Subjective: Interval History: Presently patient denies any nausea or vomiting. Her breathing is better nd present she is on CPAP  Objective: Vital signs in last 24 hours: Temp:  [96.3 F (35.7 C)-97.6 F (36.4 C)] 97.5 F (36.4 C) (08/08 0700) Pulse Rate:  [89-98] 91 (08/08 0719) Resp:  [12-22] 12 (08/08 0719) BP: (108-138)/(53-65) 135/65 mmHg (08/08 0700) SpO2:  [90 %-97 %] 94 % (08/08 0719) FiO2 (%):  [35 %] 35 % (08/08 0719) Weight change:   Intake/Output from previous day: 08/07 0701 - 08/08 0700 In: 840 [P.O.:840] Out: 1000 [Urine:1000] Intake/Output this shift:    Generally patient is alert no apparent distress. Chest: Decreased breath sound. she doesn't have any wheezing. Heart exam revealed regular rate and rhythm Abdomen: Obese, nontender Extremities she has 2+ edema bilaterally  La he he hb Results: No results found for this basename: WBC, HGB, HCT, PLT,  in the last 72 hours BMET:   Recent Labs  01/19/14 0616 01/20/14 0643  NA 144 144  K 4.5 4.4  CL 99 98  CO2 36* 36*  GLUCOSE 185* 185*  BUN 84* 85*  CREATININE 2.22* 2.08*  CALCIUM 8.8 8.9   No results found for this basename: PTH,  in the last 72 hours Iron Studies: No results found for this basename: IRON, TIBC, TRANSFERRIN, FERRITIN,  in the last 72 hours  Studies/Results: No results found.  I have reviewed the patient's current medications.  Assessment/Plan: Problem #1 renal failure: Acute on chronic. Her BUN and creatinine is improving. Patient denies any nausea or vomiting. Her creatinine is returned to her baseline. Problem #2 hyperkalemia: Potassium has corrected Problem #3 difficulty breathing: Presently improving. His combination of COPD and also CHF.presently started on Demadex she is none oliguric and feeling better. Patient is still with some edema Problem #4 diabetes Problem #5 anemia: patient was started on erythropoietin  Problem #6 metabolic bone disease: Calcium is range but  phosphorus is slightly high is stable. Problem #7 history of hypothyroidism she is on Synthroid Plan: We'll increase Demadex to 60 mg by mouth once a day..   LOS: 14 days   Yash Cacciola S 01/20/2014,7:46 AM

## 2014-01-20 NOTE — Progress Notes (Signed)
NAME:  [name] Green, [name]               ACCOUNT NO.:  [id number]  MEDICAL RECORD NO.:  [id number]  LOCATION:  A332                          FACILITY:  [facility]  PHYSICIAN:  Melvyn Novasichard Michael Kahlie Deutscher, MDDATE OF BIRTH:  December 20, 1955  DATE OF PROCEDURE:  01/20/2014 DATE OF DISCHARGE:                                PROGRESS NOTE   HISTORY OF PRESENT ILLNESS:  A 58 year old white female with multiple medical problems, currently on BiPAP, has acute on chronic renal failure, renal failure improving to 2.08 today.  Hemoglobin, she had some hematochezia stable at 8.7.  She has multifactorial anemia due to gastritis, duodenal lesions.  Hemoglobin remaining stable, checking it every 12 hours.  PHYSICAL EXAMINATION:  LUNGS:  Diminished breath sounds at bases.  No rales, wheeze, or rhonchi appreciable. HEART:  Regular rhythm.  No S3, S4.  No heaves, thrills, or rubs. VITAL SIGNS:  Afebrile, pulse is 91, blood pressure is 135/65, O2 sat is 94%.  PLAN:  Right now is to continue to monitor renal function as per Renal. Pulmonary, continue current measures, and we will advance cautiously towards discharge and significant home health care in the days ahead.     Melvyn Novasichard Michael Timothy Trudell, MD     RMD/MEDQ  D:  01/20/2014  T:  01/20/2014  Job:  [id number]

## 2014-01-20 NOTE — Progress Notes (Signed)
[id number] 

## 2014-01-21 LAB — BASIC METABOLIC PANEL
ANION GAP: 10 (ref 5–15)
BUN: 88 mg/dL — ABNORMAL HIGH (ref 6–23)
CHLORIDE: 98 meq/L (ref 96–112)
CO2: 38 mEq/L — ABNORMAL HIGH (ref 19–32)
Calcium: 8.8 mg/dL (ref 8.4–10.5)
Creatinine, Ser: 1.97 mg/dL — ABNORMAL HIGH (ref 0.50–1.10)
GFR calc non Af Amer: 27 mL/min — ABNORMAL LOW (ref >90)
GFR, EST AFRICAN AMERICAN: 32 mL/min — AB (ref >90)
Glucose, Bld: 145 mg/dL — ABNORMAL HIGH (ref 70–99)
POTASSIUM: 4.1 meq/L (ref 3.7–5.3)
Sodium: 146 mEq/L (ref 137–147)

## 2014-01-21 LAB — PHOSPHORUS: Phosphorus: 4.9 mg/dL — ABNORMAL HIGH (ref 2.3–4.6)

## 2014-01-21 LAB — GLUCOSE, CAPILLARY
GLUCOSE-CAPILLARY: 129 mg/dL — AB (ref 70–99)
GLUCOSE-CAPILLARY: 186 mg/dL — AB (ref 70–99)
GLUCOSE-CAPILLARY: 158 mg/dL — AB (ref 70–99)
Glucose-Capillary: 147 mg/dL — ABNORMAL HIGH (ref 70–99)

## 2014-01-21 LAB — HEMOGLOBIN AND HEMATOCRIT, BLOOD
HEMATOCRIT: 32.4 % — AB (ref 36.0–46.0)
HEMOGLOBIN: 9.8 g/dL — AB (ref 12.0–15.0)

## 2014-01-21 NOTE — Progress Notes (Signed)
Elayne Snareunice C Cart  MRN: [id number]  DOB/AGE: [birth year] 56 y.o.  Primary Care Physician:FANTA,TESFAYE, MD  Admit date: 01/06/2014  Chief Complaint:  Chief Complaint  Patient presents with  . Altered Mental Status    S-Pt presented on  01/06/2014 with  Chief Complaint  Patient presents with  . Altered Mental Status  .    Pt offers no new complaints.    The only questions pt asks me is " When can I go home"     Meds . antiseptic oral rinse  15 mL Mouth Rinse BID  . antiseptic oral rinse  15 mL Mouth Rinse BID  . darbepoetin  60 mcg Subcutaneous Q7 days  . DULoxetine  20 mg Oral Daily  . folic acid  1 mg Oral Daily  . hydrocortisone   Rectal BID  . insulin aspart  0-20 Units Subcutaneous TID WC  . ipratropium-albuterol  3 mL Nebulization TID  . levothyroxine  200 mcg Oral QAC breakfast  . magic mouthwash  5 mL Oral TID  . nystatin cream   Topical BID  . pantoprazole  40 mg Oral BID AC  . PARoxetine  20 mg Oral Daily  . polyethylene glycol  17 g Oral BID  . predniSONE  20 mg Oral Q breakfast  . simvastatin  40 mg Oral q morning - 10a  . sodium chloride  10-40 mL Intracatheter Q12H  . torsemide  60 mg Oral Daily     Physical Exam: Vital signs in last 24 hours: Temp:  [97.5 F (36.4 C)-97.9 F (36.6 C)] 97.9 F (36.6 C) (08/09 0418) Pulse Rate:  [92-98] 92 (08/09 0418) Resp:  [16-17] 16 (08/09 0418) BP: (113-117)/(68) 117/68 mmHg (08/09 0418) SpO2:  [94 %-99 %] 96 % (08/09 0709) FiO2 (%):  [35 %] 35 % (08/09 0418) Weight change:  Last BM Date: 01/21/14  Intake/Output from previous day: 08/08 0701 - 08/09 0700 In: 490 [P.O.:480; I.V.:10] Out: 3450 [Urine:3450]     Physical Exam: General- pt is awake,alert, oriented to time place and person Resp- No acute REsp distress, Decreased bs at bases CVS- S1S2 regular in rate and rhythm GIT- BS+, soft, NT, ND, Morbidly obese EXT- Trace LE Edema,NO  Cyanosis   Lab Results:  CBC    Component Value Date/Time   WBC 6.0 01/17/2014 0532   RBC 2.88* 01/17/2014 0532   RBC 3.28* 01/03/2013 2334   HGB 9.8* 01/21/2014 0610   HCT 32.4* 01/21/2014 0610   PLT 100* 01/17/2014 0532   MCV 98.6 01/17/2014 0532   MCH 30.2 01/17/2014 0532   MCHC 30.6 01/17/2014 0532   RDW 21.2* 01/17/2014 0532   LYMPHSABS 0.2* 01/14/2014 0413   MONOABS 0.2 01/14/2014 0413   EOSABS 0.0 01/14/2014 0413   BASOSABS 0.0 01/14/2014 0413       BMET  Recent Labs  01/20/14 0643 01/21/14 0610  NA 144 146  K 4.4 4.1  CL 98 98  CO2 36* 38*  GLUCOSE 185* 145*  BUN 85* 88*  CREATININE 2.08* 1.97*  CALCIUM 8.9 8.8     Trend Creat 2015  1.27=>2.69=>2.08=>1.97 1.12=>2.47=>2.07=>1.63=>1.22( Last admission)      Lab Results  Component Value Date   CALCIUM 8.8 01/21/2014   PHOS 4.9* 01/21/2014     Impression: 1)Renal  AKI secondary to ATN                ATN improving  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  5)Chronic LE dema On Diuretics  Primary MD following  6)Electrolytes  Hyperkalemic- now better    7)Acid base Co2 at baseline  8) ?SLE - ANA Positive, Complement C4 low but C3 normal         Anti DS DNa an less than 1            Unlikely SLE      Plan:  Will continue current care    Rayford Williamsen S 01/21/2014, 12:53 PM

## 2014-01-21 NOTE — Progress Notes (Signed)
Subjective: She's not eating very well. She slept most of the day yesterday. She still says she has some trouble with her mouth with what appears to be yeast infection in her mouth. Because of her poor responsiveness she had a blood gas done yesterday evening which shows chronic respiratory failure but nothing acute. Her laboratory work this morning shows her renal function has improved slightly. Her hemoglobin level was adequate. Her blood sugar has been okay.  Objective: Vital signs in last 24 hours: Temp:  [97.5 F (36.4 C)-97.9 F (36.6 C)] 97.9 F (36.6 C) (08/09 0418) Pulse Rate:  [92-98] 92 (08/09 0418) Resp:  [16-17] 16 (08/09 0418) BP: (113-117)/(68) 117/68 mmHg (08/09 0418) SpO2:  [94 %-99 %] 96 % (08/09 0709) FiO2 (%):  [35 %] 35 % (08/09 0418) Weight change:  Last BM Date: 01/19/14  Intake/Output from previous day: 08/08 0701 - 08/09 0700 In: 40 [P.O.:480; I.V.:10] Out: 3450 [Urine:3450]  PHYSICAL EXAM General appearance: alert, morbidly obese and Responsive but sleepy Resp: rhonchi bilaterally Cardio: regular rate and rhythm, S1, S2 normal, no murmur, click, rub or gallop GI: Obese and soft Extremities: 2+ edema  Lab Results:  Results for orders placed during the hospital encounter of 01/06/14 (from the past 48 hour(s))  GLUCOSE, CAPILLARY     Status: Abnormal   Collection Time    01/19/14 11:24 AM      Result Value Ref Range   Glucose-Capillary 140 (*) 70 - 99 mg/dL  GLUCOSE, CAPILLARY     Status: Abnormal   Collection Time    01/19/14  4:03 PM      Result Value Ref Range   Glucose-Capillary 222 (*) 70 - 99 mg/dL  GLUCOSE, CAPILLARY     Status: Abnormal   Collection Time    01/19/14  8:58 PM      Result Value Ref Range   Glucose-Capillary 211 (*) 70 - 99 mg/dL   Comment 1 Notify RN     Comment 2 Documented in Chart    BASIC METABOLIC PANEL     Status: Abnormal   Collection Time    01/20/14  6:43 AM      Result Value Ref Range   Sodium 144  137 - 147  mEq/L   Potassium 4.4  3.7 - 5.3 mEq/L   Chloride 98  96 - 112 mEq/L   CO2 36 (*) 19 - 32 mEq/L   Glucose, Bld 185 (*) 70 - 99 mg/dL   BUN 85 (*) 6 - 23 mg/dL   Creatinine, Ser 2.08 (*) 0.50 - 1.10 mg/dL   Calcium 8.9  8.4 - 10.5 mg/dL   GFR calc non Af Amer 25 (*) >90 mL/min   GFR calc Af Amer 30 (*) >90 mL/min   Comment: (NOTE)     The eGFR has been calculated using the CKD EPI equation.     This calculation has not been validated in all clinical situations.     eGFR's persistently <90 mL/min signify possible Chronic Kidney     Disease.   Anion gap 10  5 - 15  GLUCOSE, CAPILLARY     Status: Abnormal   Collection Time    01/20/14  7:58 AM      Result Value Ref Range   Glucose-Capillary 161 (*) 70 - 99 mg/dL   Comment 1 Notify RN    GLUCOSE, CAPILLARY     Status: Abnormal   Collection Time    01/20/14 12:43 PM  Result Value Ref Range   Glucose-Capillary 161 (*) 70 - 99 mg/dL  GLUCOSE, CAPILLARY     Status: Abnormal   Collection Time    01/20/14  4:19 PM      Result Value Ref Range   Glucose-Capillary 176 (*) 70 - 99 mg/dL  BLOOD GAS, ARTERIAL     Status: Abnormal   Collection Time    01/20/14  9:00 PM      Result Value Ref Range   O2 Content 3.0     Delivery systems NASAL CANNULA     pH, Arterial 7.428  7.350 - 7.450   pCO2 arterial 57.6 (*) 35.0 - 45.0 mmHg   Comment: CRITICAL RESULT CALLED TO, READ BACK BY AND VERIFIED WITH:     RAIMONDI CUMMINGS,RN BY BRITTANY FRETWELL,RRT,RCP ON 01/20/14 AT 2043   pO2, Arterial 75.5 (*) 80.0 - 100.0 mmHg   Bicarbonate 37.4 (*) 20.0 - 24.0 mEq/L   TCO2 34.6  0 - 100 mmol/L   Acid-Base Excess 12.4 (*) 0.0 - 2.0 mmol/L   O2 Saturation 95.0     Patient temperature 37.0     Collection site LEFT RADIAL     Drawn by [id number]     Sample type ARTERIAL DRAW     Allens test (pass/fail) PASS  PASS  GLUCOSE, CAPILLARY     Status: Abnormal   Collection Time    01/20/14 10:00 PM      Result Value Ref Range   Glucose-Capillary 174 (*) 70 -  99 mg/dL   Comment 1 Notify RN    PHOSPHORUS     Status: Abnormal   Collection Time    01/21/14  6:10 AM      Result Value Ref Range   Phosphorus 4.9 (*) 2.3 - 4.6 mg/dL  BASIC METABOLIC PANEL     Status: Abnormal   Collection Time    01/21/14  6:10 AM      Result Value Ref Range   Sodium 146  137 - 147 mEq/L   Potassium 4.1  3.7 - 5.3 mEq/L   Chloride 98  96 - 112 mEq/L   CO2 38 (*) 19 - 32 mEq/L   Glucose, Bld 145 (*) 70 - 99 mg/dL   BUN 88 (*) 6 - 23 mg/dL   Creatinine, Ser 1.97 (*) 0.50 - 1.10 mg/dL   Calcium 8.8  8.4 - 10.5 mg/dL   GFR calc non Af Amer 27 (*) >90 mL/min   GFR calc Af Amer 32 (*) >90 mL/min   Comment: (NOTE)     The eGFR has been calculated using the CKD EPI equation.     This calculation has not been validated in all clinical situations.     eGFR's persistently <90 mL/min signify possible Chronic Kidney     Disease.   Anion gap 10  5 - 15  HEMOGLOBIN AND HEMATOCRIT, BLOOD     Status: Abnormal   Collection Time    01/21/14  6:10 AM      Result Value Ref Range   Hemoglobin 9.8 (*) 12.0 - 15.0 g/dL   HCT 32.4 (*) 36.0 - 46.0 %    ABGS  Recent Labs  01/20/14 2100  PHART 7.428  PO2ART 75.5*  TCO2 34.6  HCO3 37.4*   CULTURES No results found for this or any previous visit (from the past 240 hour(s)). Studies/Results: No results found.  Medications:  Prior to Admission:  Prescriptions prior to admission  Medication Sig Dispense Refill  .  albuterol (PROVENTIL) (2.5 MG/3ML) 0.083% nebulizer solution Take 3 mLs (2.5 mg total) by nebulization every 4 (four) hours as needed for wheezing or shortness of breath.  75 mL  12  . aspirin EC 81 MG EC tablet Take 1 tablet (81 mg total) by mouth daily.  30 tablet  12  . clonazePAM (KLONOPIN) 1 MG tablet Take 0.5 tablets (0.5 mg total) by mouth 4 (four) times daily as needed for anxiety.  30 tablet  0  . DULoxetine (CYMBALTA) 20 MG capsule Take 1 capsule (20 mg total) by mouth daily.  30 capsule  3  . folic  acid (FOLVITE) 1 MG tablet Take 1 tablet (1 mg total) by mouth daily.  30 tablet  12  . furosemide (LASIX) 40 MG tablet Take 1 tablet (40 mg total) by mouth daily.  30 tablet  1  . HYDROcodone-acetaminophen (NORCO/VICODIN) 5-325 MG per tablet Take 1 tablet by mouth every 4 (four) hours as needed for moderate pain.      Marland Kitchen insulin lispro (HUMALOG) 100 UNIT/ML injection Inject 2-10 Units into the skin 3 (three) times daily before meals. SS      . levothyroxine (SYNTHROID, LEVOTHROID) 200 MCG tablet Take 200 mcg by mouth daily before breakfast.      . lisinopril (PRINIVIL,ZESTRIL) 5 MG tablet Take 1 tablet (5 mg total) by mouth daily.  30 tablet  1  . PARoxetine (PAXIL) 20 MG tablet Take 20 mg by mouth every morning.      . potassium chloride SA (K-DUR,KLOR-CON) 20 MEQ tablet Take 40 mEq by mouth daily.      . promethazine (PHENERGAN) 25 MG tablet Take 25 mg by mouth every 6 (six) hours as needed for nausea or vomiting.      . simvastatin (ZOCOR) 40 MG tablet Take 40 mg by mouth every morning.       . temazepam (RESTORIL) 15 MG capsule Take 15 mg by mouth at bedtime as needed for sleep.       Scheduled: . antiseptic oral rinse  15 mL Mouth Rinse BID  . antiseptic oral rinse  15 mL Mouth Rinse BID  . darbepoetin  60 mcg Subcutaneous Q7 days  . DULoxetine  20 mg Oral Daily  . folic acid  1 mg Oral Daily  . hydrocortisone   Rectal BID  . insulin aspart  0-20 Units Subcutaneous TID WC  . ipratropium-albuterol  3 mL Nebulization TID  . levothyroxine  200 mcg Oral QAC breakfast  . magic mouthwash  5 mL Oral TID  . nystatin cream   Topical BID  . pantoprazole  40 mg Oral BID AC  . PARoxetine  20 mg Oral Daily  . polyethylene glycol  17 g Oral BID  . predniSONE  20 mg Oral Q breakfast  . simvastatin  40 mg Oral q morning - 10a  . sodium chloride  10-40 mL Intracatheter Q12H  . torsemide  60 mg Oral Daily   Continuous: . sodium chloride 10 mL/hr at 01/14/14 1900   VCB:SWHQPRFFM, benzonatate,  clonazePAM, HYDROcodone-acetaminophen, ondansetron (ZOFRAN) IV, ondansetron, sodium chloride, sodium chloride  Assesment: I discussed her situation with her family. She has diabetes heart disease lung disease chronic respiratory failure renal failure history of liver failure with hepatic encephalopathy morbid obesity. She's sleeping most of the time now and not eating much. I think her long-term prognosis is poor and short-term prognosis only fair Active Problems:   Anemia    Plan: I strongly encouraged her to  eat to get out of bed and to become more active.    LOS: 15 days   Adison Reifsteck L 01/21/2014, 8:15 AM

## 2014-01-21 NOTE — Progress Notes (Signed)
NAME:  [name] Green, [name] Green               ACCOUNT NO.:  [id number]  MEDICAL RECORD NO.:  [id number]  LOCATION:  A332                          FACILITY:  [facility]  PHYSICIAN:  Melvyn Novasichard Michael Anja Neuzil, MDDATE OF BIRTH:  02/28/1956  DATE OF PROCEDURE: DATE OF DISCHARGE:                                PROGRESS NOTE   SUBJECTIVE:  The patient has a chronic respiratory insufficiency, anemia multifactorial, chronic renal failure with significant improvement with fluid resuscitation.  Creatinine improved to 2.08, potassium 4.4, blood gas just reveals chronic respiratory acidosis, well compensated, nothing acute.  OBJECTIVE:  LUNGS:  Diminished breath sounds at the bases.  No rales, wheeze, or rhonchi. HEART:  Regular rhythm.  No murmurs, gallops, or rubs. ABDOMEN:  Soft, nontender.  Bowel sounds normoactive. VITAL SIGNS:  Blood pressure 117/68.  She is afebrile.  LABORATORY DATA:  Creatinine stable at 9.8.  Good glycemic control.  PLAN:  Right now is continue current therapy.  Monitor hemoglobin, hematocrit and pursue aggressive pulmonary nebulizer therapy.     Melvyn Novasichard Michael Quanetta Truss, MD     RMD/MEDQ  D:  01/21/2014  T:  01/21/2014  Job:  [id number]

## 2014-01-21 NOTE — Progress Notes (Signed)
[id number] 

## 2014-01-22 LAB — BASIC METABOLIC PANEL
ANION GAP: 7 (ref 5–15)
BUN: 85 mg/dL — ABNORMAL HIGH (ref 6–23)
CHLORIDE: 100 meq/L (ref 96–112)
CO2: 40 meq/L — AB (ref 19–32)
Calcium: 8.5 mg/dL (ref 8.4–10.5)
Creatinine, Ser: 1.8 mg/dL — ABNORMAL HIGH (ref 0.50–1.10)
GFR calc Af Amer: 35 mL/min — ABNORMAL LOW (ref >90)
GFR calc non Af Amer: 30 mL/min — ABNORMAL LOW (ref >90)
Glucose, Bld: 131 mg/dL — ABNORMAL HIGH (ref 70–99)
Potassium: 3.7 mEq/L (ref 3.7–5.3)
Sodium: 147 mEq/L (ref 137–147)

## 2014-01-22 LAB — GLUCOSE, CAPILLARY: Glucose-Capillary: 135 mg/dL — ABNORMAL HIGH (ref 70–99)

## 2014-01-22 LAB — HEMOGLOBIN AND HEMATOCRIT, BLOOD
HCT: 32.2 % — ABNORMAL LOW (ref 36.0–46.0)
Hemoglobin: 9.7 g/dL — ABNORMAL LOW (ref 12.0–15.0)

## 2014-01-22 MED ORDER — PREDNISONE 10 MG PO TABS: 10.0000 mg | ORAL_TABLET | Freq: Every day | ORAL | Status: AC

## 2014-01-22 MED ORDER — NYSTATIN 100000 UNIT/GM EX CREA: TOPICAL_CREAM | Freq: Two times a day (BID) | CUTANEOUS | Status: AC

## 2014-01-22 MED ORDER — HYDROCORTISONE 2.5 % RE CREA: TOPICAL_CREAM | Freq: Two times a day (BID) | RECTAL | Status: AC

## 2014-01-22 MED ORDER — TORSEMIDE 20 MG PO TABS: 60.0000 mg | ORAL_TABLET | Freq: Every day | ORAL | Status: AC

## 2014-01-22 MED ORDER — PANTOPRAZOLE SODIUM 40 MG PO TBEC: 40.0000 mg | DELAYED_RELEASE_TABLET | Freq: Two times a day (BID) | ORAL | Status: AC

## 2014-01-22 MED ORDER — SODIUM CHLORIDE 0.9 % IV BOLUS (SEPSIS)
300.0000 mL | Freq: Once | INTRAVENOUS | Status: AC
  Administered 2014-01-22: 300 mL via INTRAVENOUS

## 2014-01-22 MED ORDER — MAGIC MOUTHWASH: 5.0000 mL | Freq: Three times a day (TID) | ORAL | Status: AC

## 2014-01-22 NOTE — Progress Notes (Signed)
Subjective: Interval History: Presently patient denies any nausea or vomiting. At this moment her main complain is sore mouth  Objective: Vital signs in last 24 hours: Temp:  [97.4 F (36.3 C)-98.5 F (36.9 C)] 97.7 F (36.5 C) (08/10 0639) Pulse Rate:  [91-102] 100 (08/10 0639) Resp:  [14-16] 15 (08/10 0639) BP: (85-114)/(38-59) 85/38 mmHg (08/10 0639) SpO2:  [97 %-100 %] 97 % (08/10 0720) Weight change:   Intake/Output from previous day: 08/09 0701 - 08/10 0700 In: 0  Out: 3700 [Urine:3700] Intake/Output this shift:    Generally patient is alert no apparent distress. Chest: Decreased breath sound. she doesn't have any wheezing. Heart exam revealed regular rate and rhythm Abdomen: Obese, nontender Extremities she has 2+ edema bilaterally  La he he hb Results:  Recent Labs  01/21/14 0610  HGB 9.8*  HCT 32.4*   BMET:   Recent Labs  01/20/14 0643 01/21/14 0610  NA 144 146  K 4.4 4.1  CL 98 98  CO2 36* 38*  GLUCOSE 185* 145*  BUN 85* 88*  CREATININE 2.08* 1.97*  CALCIUM 8.9 8.8   No results found for this basename: PTH,  in the last 72 hours Iron Studies: No results found for this basename: IRON, TIBC, TRANSFERRIN, FERRITIN,  in the last 72 hours  Studies/Results: No results found.  I have reviewed the patient's current medications.  Assessment/Plan: Problem #1 renal failure: Acute on chronic. Her rena function is recovering and she is asymptomatic. Her creatinine is returned to her baseline. Problem #2 hyperkalemia: Potassium has corrected Problem #3 difficulty breathing: Presently improving. His combination of COPD and also CHF.presently she is on Demadex and she has 3700 cc of urine and anasarca improving. Problem #4 diabetes Problem #5 anemia: Anemia of chronic renal failure her Hemoglobin is stable Problem #6 metabolic bone disease: Calcium is range but phosphorus is slightly high is stable. Problem #7 history of hypothyroidism she is on  Synthroid Plan: We'll continue with present dose of Demadex basic metabolic panel in am   LOS: 16 days   Doree Kuehne S 01/22/2014,8:07 AM

## 2014-01-22 NOTE — Progress Notes (Signed)
Patient discharged this am with instructions given on medications,and follow up visits,patient,and family verbalized understanding.Prescriptions sent with patient.[facility] to follow up with patient at home.Transported by EMS to home.

## 2014-01-22 NOTE — Discharge Summary (Signed)
Physician Discharge Summary  Patient ID: [name] Green MRN: [id number] DOB/AGE: 01/22/1956 58 y.o. Primary Care Physician:FANTA,TESFAYE, MD Admit date: 01/06/2014 Discharge date: 01/22/2014    Discharge Diagnoses:  1. Congestive heart failure, improving. 2. COPD with chronic respiratory failure and type II respiratory failure/CO2 retention. 3. Obstructive sleep apnea has been ruled out and CPAP is not beneficial. Patient has BiPAP that will help her. 4. Acute on chronic renal failure, now creatinine back to her baseline. 5. Severe deconditioning. 6. Anemia of chronic renal failure. 7. Hypothyroidism. 8. Poor functional status and prognosis poor for the survival in the next 6 months. High risk of readmission.     Medication List    STOP taking these medications       furosemide 40 MG tablet  Commonly known as:  LASIX     temazepam 15 MG capsule  Commonly known as:  RESTORIL      TAKE these medications       albuterol (2.5 MG/3ML) 0.083% nebulizer solution  Commonly known as:  PROVENTIL  Take 3 mLs (2.5 mg total) by nebulization every 4 (four) hours as needed for wheezing or shortness of breath.     aspirin 81 MG EC tablet  Take 1 tablet (81 mg total) by mouth daily.     clonazePAM 1 MG tablet  Commonly known as:  KLONOPIN  Take 0.5 tablets (0.5 mg total) by mouth 4 (four) times daily as needed for anxiety.     DULoxetine 20 MG capsule  Commonly known as:  CYMBALTA  Take 1 capsule (20 mg total) by mouth daily.     folic acid 1 MG tablet  Commonly known as:  FOLVITE  Take 1 tablet (1 mg total) by mouth daily.     HYDROcodone-acetaminophen 5-325 MG per tablet  Commonly known as:  NORCO/VICODIN  Take 1 tablet by mouth every 4 (four) hours as needed for moderate pain.     hydrocortisone 2.5 % rectal cream  Commonly known as:  ANUSOL-HC  Place rectally 2 (two) times daily.     insulin lispro 100 UNIT/ML injection  Commonly known as:  HUMALOG  Inject 2-10 Units  into the skin 3 (three) times daily before meals. SS     levothyroxine 200 MCG tablet  Commonly known as:  SYNTHROID, LEVOTHROID  Take 200 mcg by mouth daily before breakfast.     lisinopril 5 MG tablet  Commonly known as:  PRINIVIL,ZESTRIL  Take 1 tablet (5 mg total) by mouth daily.     magic mouthwash Soln  Take 5 mLs by mouth 3 (three) times daily.     nystatin cream  Commonly known as:  MYCOSTATIN  Apply topically 2 (two) times daily.     pantoprazole 40 MG tablet  Commonly known as:  PROTONIX  Take 1 tablet (40 mg total) by mouth 2 (two) times daily before a meal.     PARoxetine 20 MG tablet  Commonly known as:  PAXIL  Take 20 mg by mouth every morning.     potassium chloride SA 20 MEQ tablet  Commonly known as:  K-DUR,KLOR-CON  Take 40 mEq by mouth daily.     predniSONE 10 MG tablet  Commonly known as:  DELTASONE  Take 1 tablet (10 mg total) by mouth daily with breakfast.     promethazine 25 MG tablet  Commonly known as:  PHENERGAN  Take 25 mg by mouth every 6 (six) hours as needed for nausea or vomiting.  simvastatin 40 MG tablet  Commonly known as:  ZOCOR  Take 40 mg by mouth every morning.     torsemide 20 MG tablet  Commonly known as:  DEMADEX  Take 3 tablets (60 mg total) by mouth daily.        Discharged Condition: Stable but condition is somewhat poor.    Consults: Pulmonology. Nephrology.  Significant Diagnostic Studies: Dg Chest 1 View  01/15/2014   CLINICAL DATA:  CHF  EXAM: CHEST - 1 VIEW  COMPARISON:  01/11/2014  FINDINGS: There are bilateral diffuse interstitial and alveolar airspace opacities. There are bilateral small pleural effusions. There is no pneumothorax. There is stable cardiomegaly. There is enlargement of the central pulmonary vasculature. There is degenerative change of the right AC joint.  IMPRESSION: Overall findings consistent with pulmonary edema.   Electronically Signed   By: Elige Ko   On: 01/15/2014 10:20   Dg  Chest 1 View  01/09/2014   CLINICAL DATA:  Followup of congestive heart failure.  EXAM: CHEST - 1 VIEW  COMPARISON:  01/06/2014 and 12/06/2013  FINDINGS: Stable cardiomegaly. Diffuse pulmonary vascular congestion. There are bilateral interstitial and airspace opacities, most prominent perihilar regions and there saline hazy opacities bilaterally suggestive of bilateral posteriorly layering pleural effusions. Overall, aeration of the lungs appears slightly worse on today's chest radiograph compared to 01/06/2014.  Low right peritracheal soft tissue density measures up to 3 cm transverse dimension and could reflect distended azygos/ SVC in the setting of pulmonary venous hypertension, but lymphadenopathy or mass cannot be excluded.  IMPRESSION: Moderate to severe congestive heart failure pattern. Bilateral layering pleural effusions.  Soft tissue prominence in the lower right paratracheal region could be related to pulmonary venous congestion, but lymphadenopathy or mass cannot be completely excluded. Recommend attention on short-term follow-up PA and lateral chest radiograph with the patient's congestive heart failure improves.  These results will be called to the ordering clinician or representative by the Radiologist Assistant, and communication documented in the PACS or zVision Dashboard.   Electronically Signed   By: Britta Mccreedy M.D.   On: 01/09/2014 09:20   Ct Head Wo Contrast  01/10/2014   CLINICAL DATA:  Mental status changes  EXAM: CT HEAD WITHOUT CONTRAST  TECHNIQUE: Contiguous axial images were obtained from the base of the skull through the vertex without intravenous contrast.  COMPARISON:  None.  FINDINGS: No skull fracture is noted. Paranasal sinuses and mastoid air cells are unremarkable. There are motion artifacts.  No intracranial hemorrhage, mass effect or midline shift. No definite acute cortical infarction. No mass lesion is noted on this unenhanced scan. No hydrocephalus.  IMPRESSION: No  acute intracranial abnormality. Motion artifacts are noted. No definite acute cortical infarction.   Electronically Signed   By: Natasha Mead M.D.   On: 01/10/2014 15:43   Dg Chest Port 1 View  01/11/2014   CLINICAL DATA:  Ventilated patient; history of COPD and CHF.  EXAM: PORTABLE CHEST - 1 VIEW  COMPARISON:  Portable chest x-ray of January 10, 2014  FINDINGS: The lungs are reasonably well inflated. Confluent alveolar infiltrates persist bilaterally but have slightly improved. The left hemidiaphragm remain obscured. No endotracheal tube is demonstrated. The cardiac silhouette remains enlarged and the pulmonary vascularity remains engorged.  IMPRESSION: CHF with moderate to severe pulmonary interstitial and alveolar edema. The lungs have slightly improved since yesterday's study.   Electronically Signed   By: David  [place]   On: 01/11/2014 08:08   Dg Chest Port 1  View  01/10/2014   CLINICAL DATA:  Acute respiratory failure.  EXAM: PORTABLE CHEST - 1 VIEW  COMPARISON:  01/09/2014.  FINDINGS: Patient is rotated to the right making evaluation mediastinum difficult today's exam . Severe cardiomegaly with pulmonary vascular prominence and bilateral alveolar infiltrates consistent with congestive heart failure with pulmonary edema. Bilateral effusions are present. Similar findings on prior exam. No pneumothorax is No acute bony abnormality identified.  IMPRESSION: Congestive heart failure with bilateral pulmonary edema and pleural effusions. Similar findings noted on prior study.   Electronically Signed   By: Maisie Fus  Register   On: 01/10/2014 09:24   Dg Chest Portable 1 View  01/06/2014   CLINICAL DATA:  58 year old female with altered mental status and lethargy.  EXAM: PORTABLE CHEST - 1 VIEW  COMPARISON:  12/06/2013 and prior chest radiographs dating back to 01/03/2013  FINDINGS: Cardiomegaly and moderate pulmonary edema identified.  Bilateral pleural effusions noted, small to moderate on the right and small on the  left.  There is no evidence of pneumothorax or acute bony abnormality.  IMPRESSION: Cardiomegaly with moderate pulmonary edema and bilateral pleural effusions.   Electronically Signed   By: Laveda Abbe M.D.   On: 01/06/2014 13:59    Lab Results: Basic Metabolic Panel:  Recent Labs  62/13/08 0610 01/22/14 0754  NA 146 147  K 4.1 3.7  CL 98 100  CO2 38* 40*  GLUCOSE 145* 131*  BUN 88* 85*  CREATININE 1.97* 1.80*  CALCIUM 8.8 8.5  PHOS 4.9*  --    Liver Function Tests: No results found for this basename: AST, ALT, ALKPHOS, BILITOT, PROT, ALBUMIN,  in the last 72 hours   CBC:  Recent Labs  01/21/14 0610 01/22/14 0754  HGB 9.8* 9.7*  HCT 32.4* 32.2*    No results found for this or any previous visit (from the past 240 hour(s)).   Hospital Course: This is an unfortunate 58 year old lady presented to the hospital with symptoms of lower extremity swelling. Please see initial history as outlined below: 58 year old female who has a past medical history of COPD (chronic obstructive pulmonary disease); Diabetes mellitus without complication; Hypertension; Thyroid disease; Hyperlipemia; Neuropathy; Depression; Chronic pain; Anxiety; CHF (congestive heart failure); Hemorrhoids; Chronic cough; Peripheral edema; and On home O2.  Was brought to the ED for constant cough and worsening lower extremity swelling for past 2 weeks. Patient was recently discharged from the hospital on 12/14/2013 after being treated for CHF exacerbation. Patient is a poor historian, and she states that she has gradually developed lower extremity swelling, cough and also has been having shortness of breath. Patient is on home oxygen at 2 L per minute and the caregivers had to increase the oxygen to 4 L per minute. She denies fever, dysuria, intermittent nausea vomiting. Had loose bowel movements today, admits to having seen blood in the stool. Denies chest pain, no orthopnea or PND.  In the ED patient found to have anemia  with hemoglobin 6.1, stool for occult blood positive. One unit of PRBC is being transfused.  Also patient found to have elevated BNP 1142.0 The patient was treated for heart failure and also COPD. Unfortunately her renal function deteriorated and required BiPAP for a while. She also was severely anemic and required blood transfusion. She was able to come out of the intensive care unit and has somewhat stabilized but remains extremely weak and her functional status is rather poor. She's not really able to mobilize by herself. She requires help for most  activities of daily living. BiPAP does seem to help her. I had a long conversation with family members and discussed her outlook which is rather poor for the next 6 months. She is probably hospice eligible but the patient has not had this discussion regarding hospice services yet and I think the family appreciate that this is something that will likely need to happen in the future soon. The family also realizes she is at high risk for readmission. Discharge Exam: Blood pressure 85/38, pulse 100, temperature 97.7 F (36.5 C), temperature source Oral, resp. rate 15, height 5\' 5"  (1.651 m), weight 168.1 kg (370 lb 9.5 oz), SpO2 97.00%. She looks chronically sick. Blood pressure slightly soft but she is alert and orientated. Lung fields are actually clear in terms of crackles but she has chronic wheezing. She is alert and orientated without any focal neural signs. She does have peripheral pitting edema, this is likely chronic and there is a fine balance between intravenous diuretics and insinuating worsening of renal failure.  Disposition: Home with home health care services. The patient does not wish to go to a skilled nursing facility at all.      Discharge Instructions   Diet - low sodium heart healthy    Complete by:  As directed      Increase activity slowly    Complete by:  As directed            Follow-up Information   Follow up with [facility].   Contact information:   [address] [place] [address] [phone]       Signed: Wilson SingerGOSRANI,Juventino Pavone C   01/22/2014, 9:15 AM

## 2014-01-22 NOTE — Clinical Social Work Note (Signed)
Pt and family requesting EMS transport. CSW arranged via [facility]. Confirmed address and that family would be present upon arrival.  Derenda FennelKara Ethal [place], [place] [phone]

## 2014-01-23 NOTE — Progress Notes (Signed)
UR chart review completed.  

## 2014-02-03 ENCOUNTER — Telehealth: Payer: Self-pay | Admitting: Gastroenterology

## 2014-02-03 NOTE — Telephone Encounter (Signed)
Please call pt. HER stomach Bx mild gastritis.   CONTINUE PROTONIX. TAKE 30 MINUTES PRIOR TO MEALS TWICE DAILY. OPV SEP 2015 E30 ANEMIA-DISCUSS NEED FOR COLONOSCOPY.

## 2014-02-05 NOTE — Telephone Encounter (Signed)
FYI to [place] also.

## 2014-02-05 NOTE — Telephone Encounter (Signed)
REVIEWED.  

## 2014-02-05 NOTE — Telephone Encounter (Signed)
I called pt's sister's number and ask to speak to pt. Sister said she passed away last week.

## 2014-02-05 NOTE — Telephone Encounter (Signed)
Noted  

## 2014-02-06 ENCOUNTER — Encounter: Payer: Self-pay | Admitting: Gastroenterology

## 2014-02-06 NOTE — Telephone Encounter (Signed)
Pt is aware of OV on 10/21 at 0830 with SF and appt card mailed

## 2014-02-07 NOTE — Telephone Encounter (Signed)
REVIEWED-pt deceased.

## 2014-02-13 DEATH — deceased

## 2014-02-21 ENCOUNTER — Encounter: Payer: Self-pay | Admitting: Gastroenterology

## 2014-04-04 ENCOUNTER — Ambulatory Visit: Payer: Medicaid Other | Admitting: Gastroenterology

## 2014-06-28 ENCOUNTER — Encounter (HOSPITAL_COMMUNITY): Payer: Self-pay | Admitting: Gastroenterology

## 2016-04-20 IMAGING — CR DG CHEST 1V PORT
1 series · 1 of 1 positions shown · non-contrast
Comparison: Portable exam 1341 hr compared to 01/10/2013

CLINICAL DATA: Shortness of breath, history COPD, diabetes,
hypertension, CHF

EXAM:
PORTABLE CHEST - 1 VIEW

[portable]
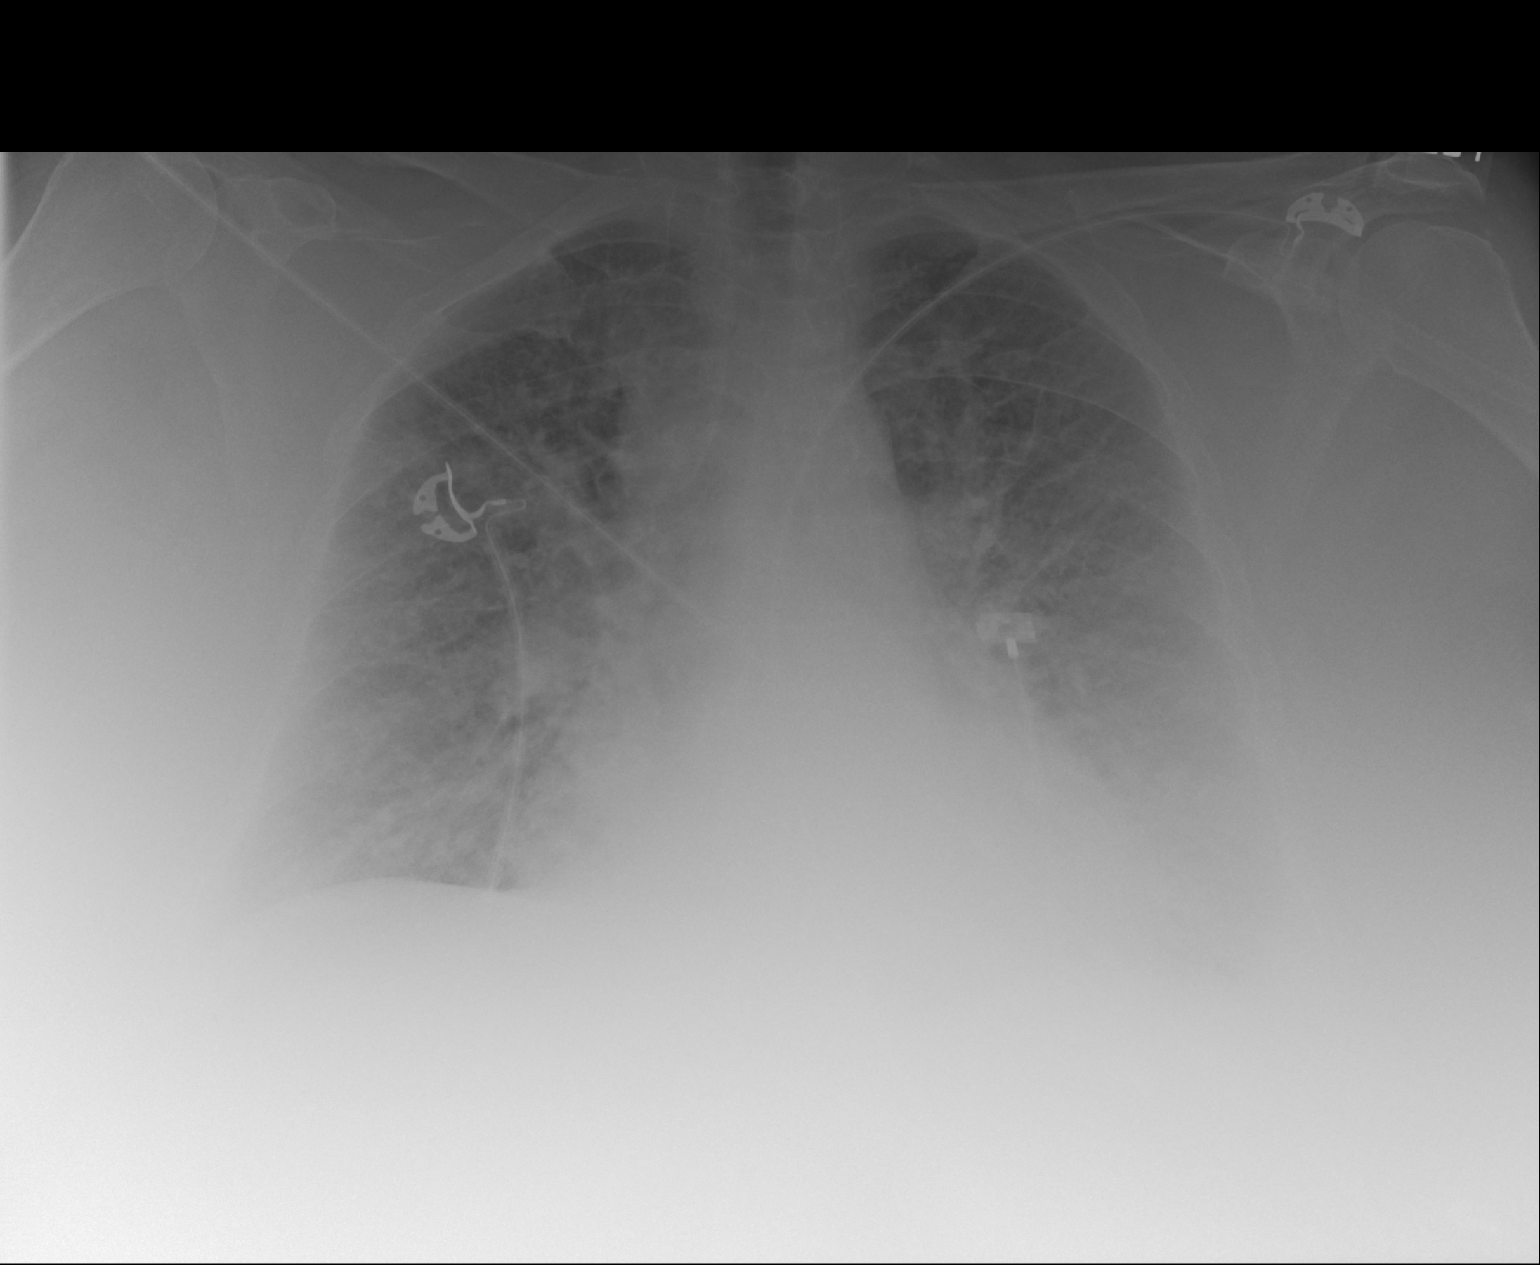

[1 of 1 positions shown; findings below may reference images not displayed]

FINDINGS: Enlargement of cardiac silhouette with pulmonary vascular
congestion.

Mild perihilar infiltrates likely pulmonary edema and CHF.

No segmental consolidation, pleural effusion or pneumothorax.

Bones demineralized.
IMPRESSION: Mild CHF.

## 2016-04-23 IMAGING — US US RENAL
1 series · 12 of 12 positions shown · non-contrast
Comparison: None.

CLINICAL DATA: Elevated creatinine.

EXAM:
RENAL/URINARY TRACT ULTRASOUND COMPLETE

[Series 1: us renal · 0.27mm/px · 12 of 12 slices shown]
[im 1/12]
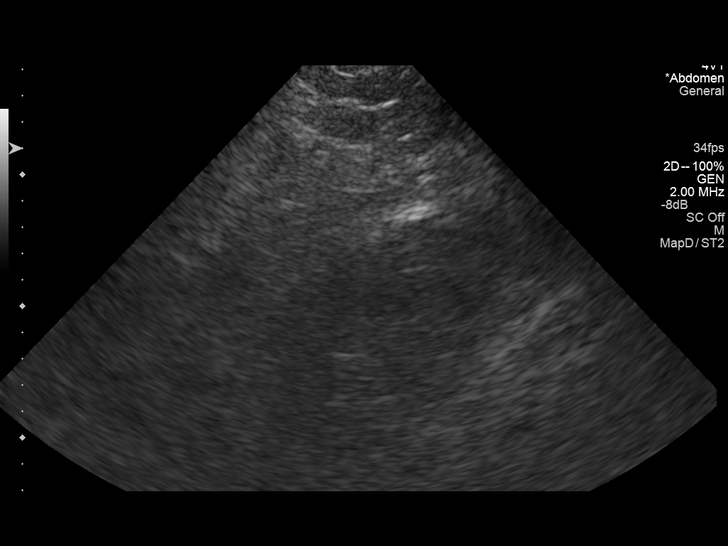
[im 2/12]
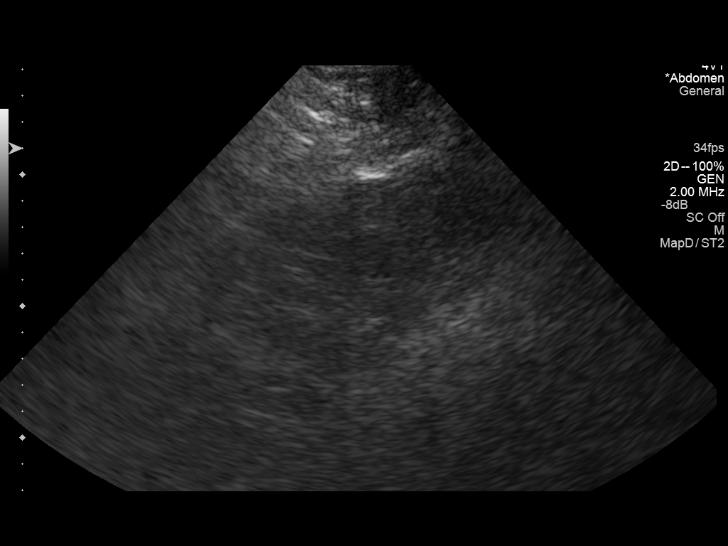
[im 3/12]
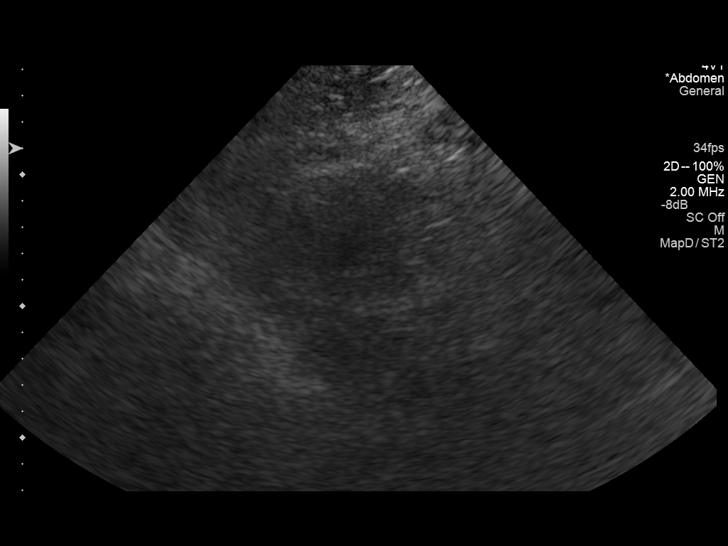
[im 4/12]
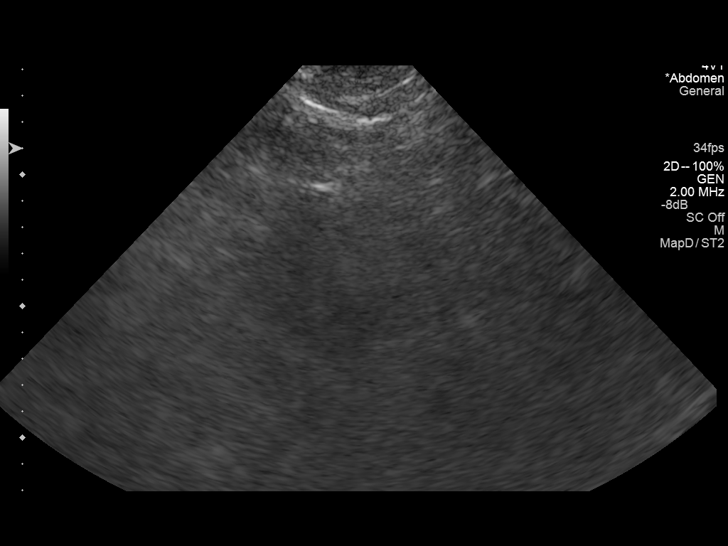
[im 5/12]
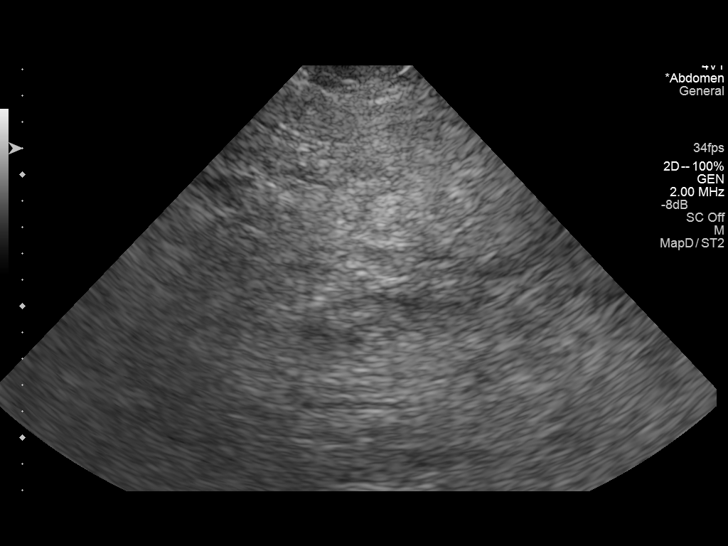
[im 6/12]
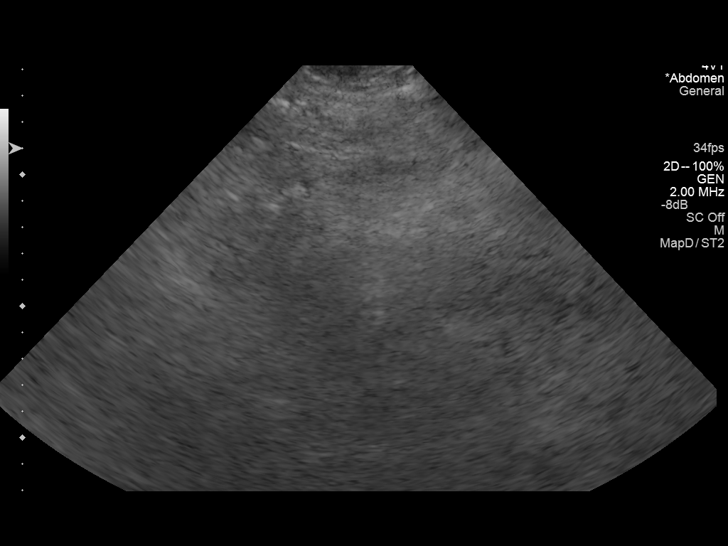
[im 7/12]
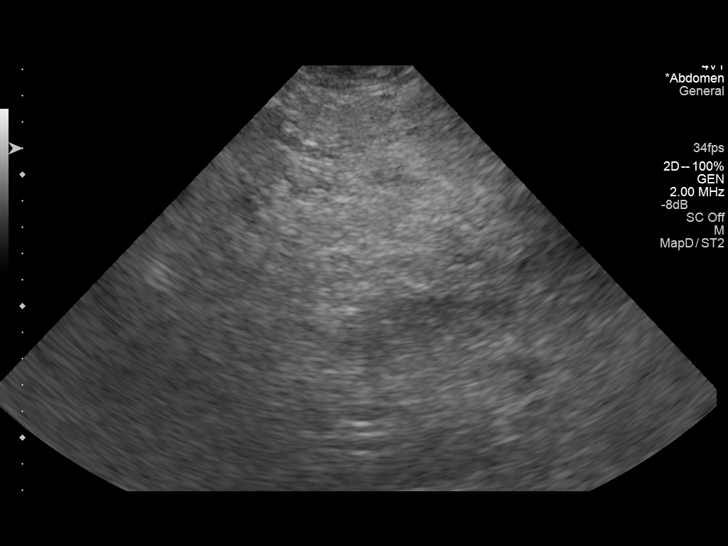
[im 8/12]
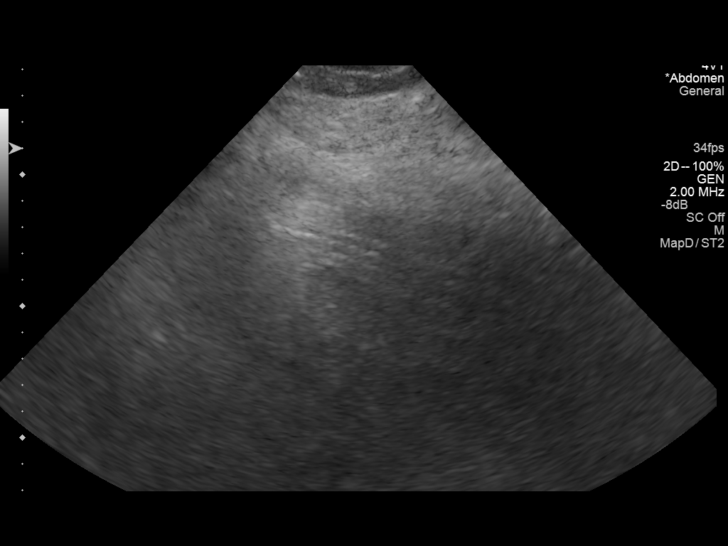
[im 9/12]
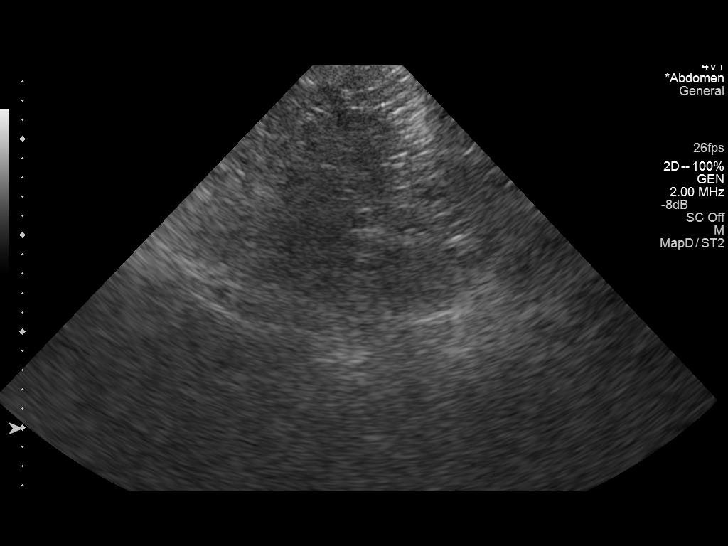
[im 10/12]
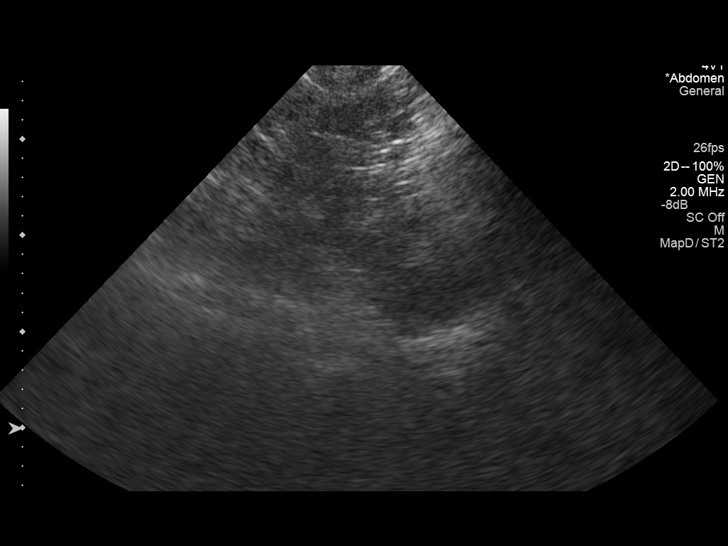
[im 11/12]
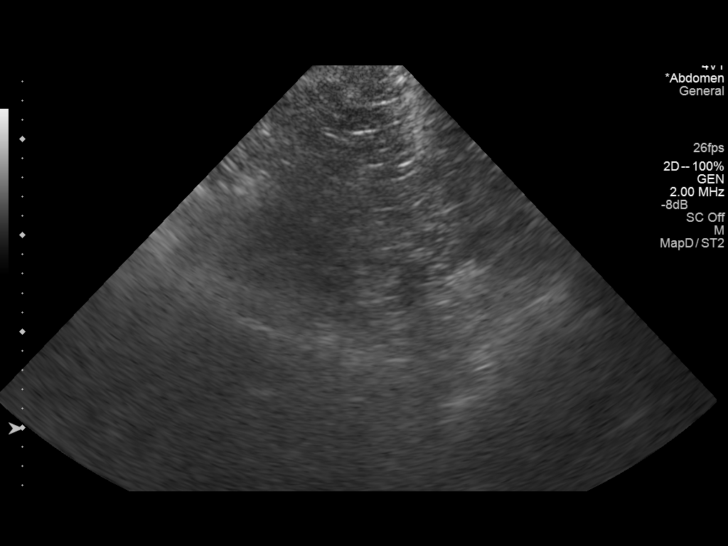
[im 12/12]
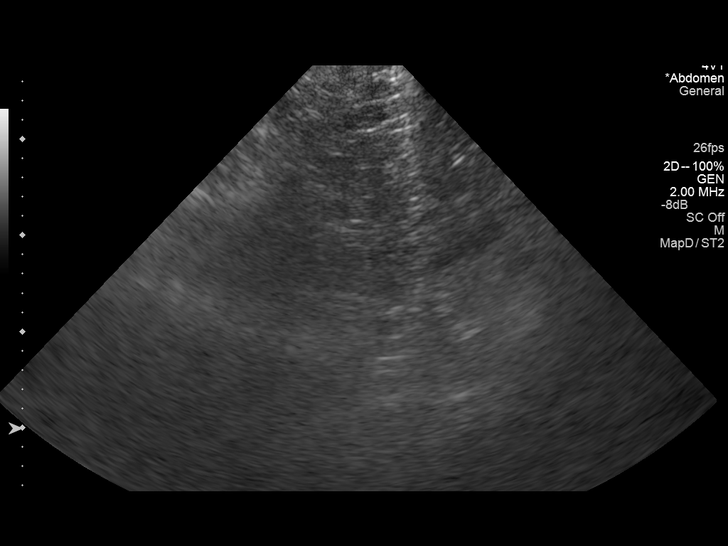

[12 of 12 positions shown; findings below may reference images not displayed]

FINDINGS: Right Kidney:

The right kidney cannot be visualized.

Left Kidney:

The left kidney cannot be visualized.

Bladder:

Decompressed by Foley catheter.
IMPRESSION: The kidneys cannot be visualized. CT can be performed to further
delineate if desired.

## 2016-05-21 IMAGING — CR DG CHEST 1V PORT
1 series · 1 of 1 positions shown · non-contrast
Comparison: 12/06/2013 and prior chest radiographs dating back to
01/03/2013

CLINICAL DATA: 58-year-old female with altered mental status and
lethargy.

EXAM:
PORTABLE CHEST - 1 VIEW

[pa]
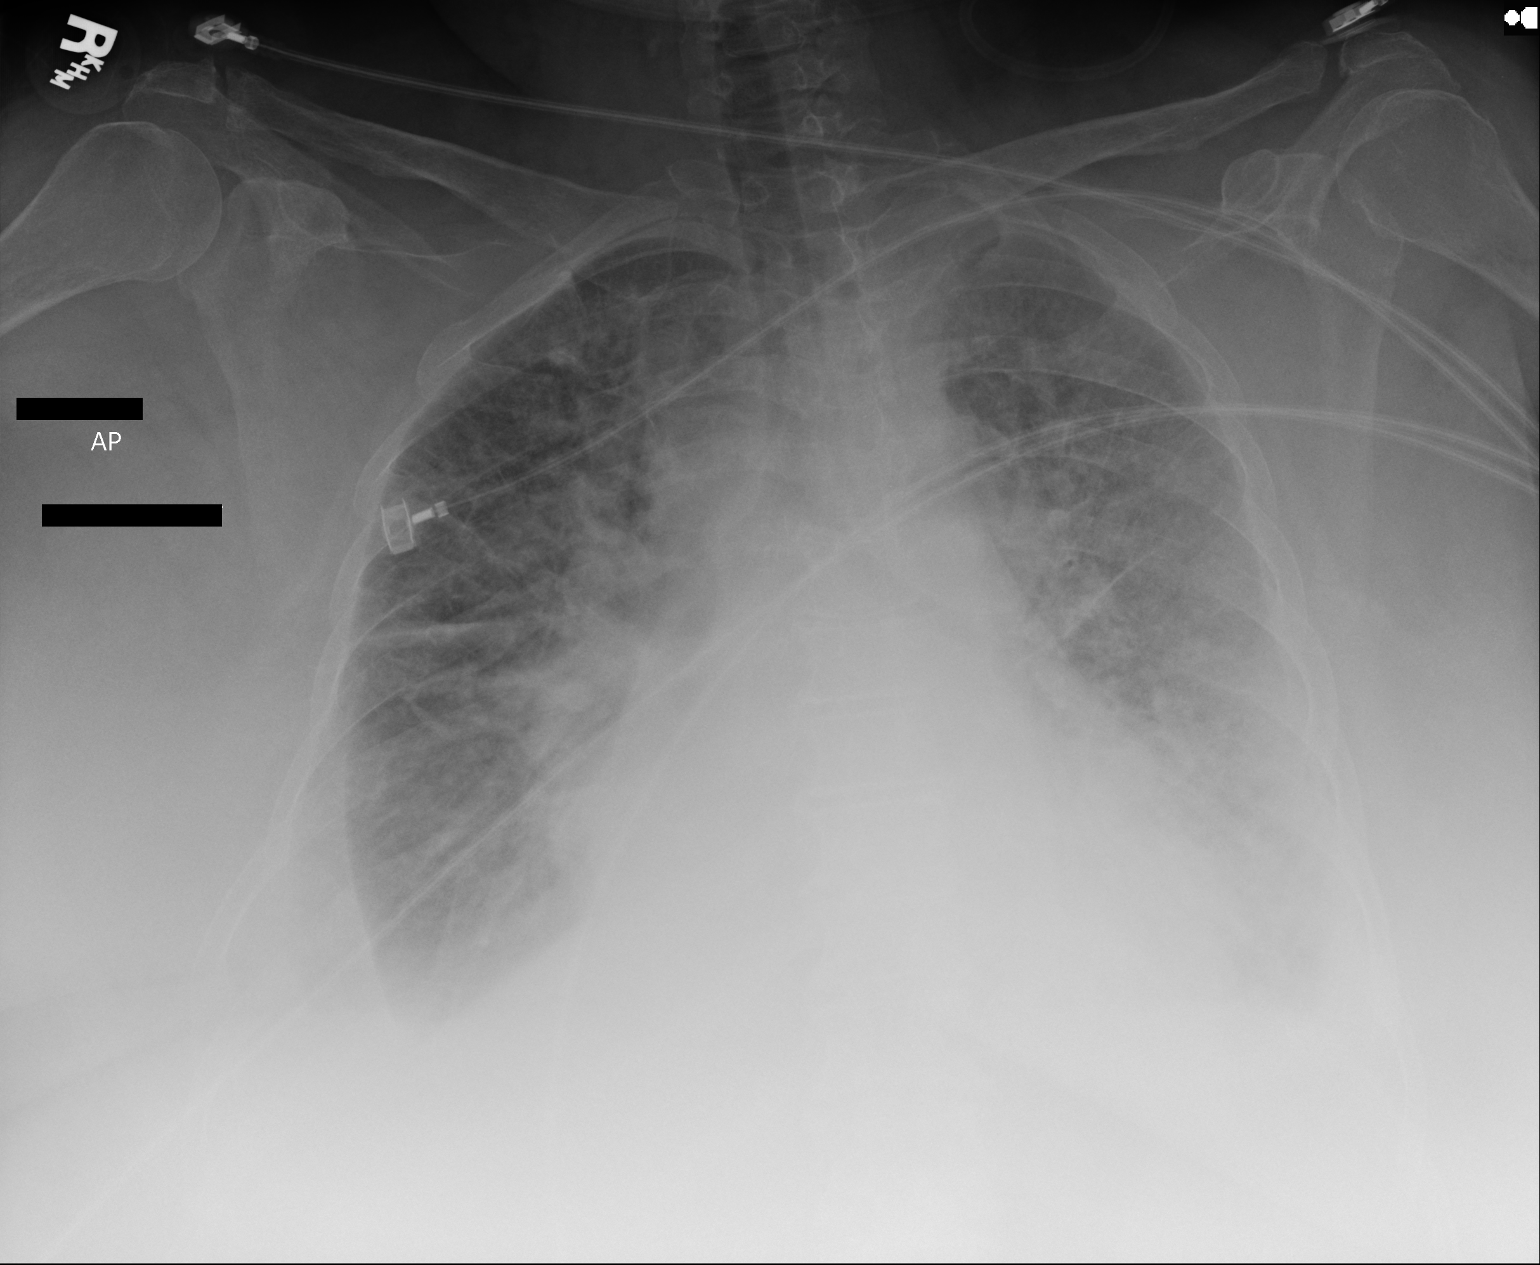

[1 of 1 positions shown; findings below may reference images not displayed]

FINDINGS: Cardiomegaly and moderate pulmonary edema identified.

Bilateral pleural effusions noted, small to moderate on the right
and small on the left.

There is no evidence of pneumothorax or acute bony abnormality.
IMPRESSION: Cardiomegaly with moderate pulmonary edema and bilateral pleural
effusions.

## 2016-05-24 IMAGING — CR DG CHEST 1V
1 series · 1 of 1 positions shown · non-contrast
Comparison: 01/06/2014 and 12/06/2013

CLINICAL DATA: Followup of congestive heart failure.

EXAM:
CHEST - 1 VIEW

[ap]
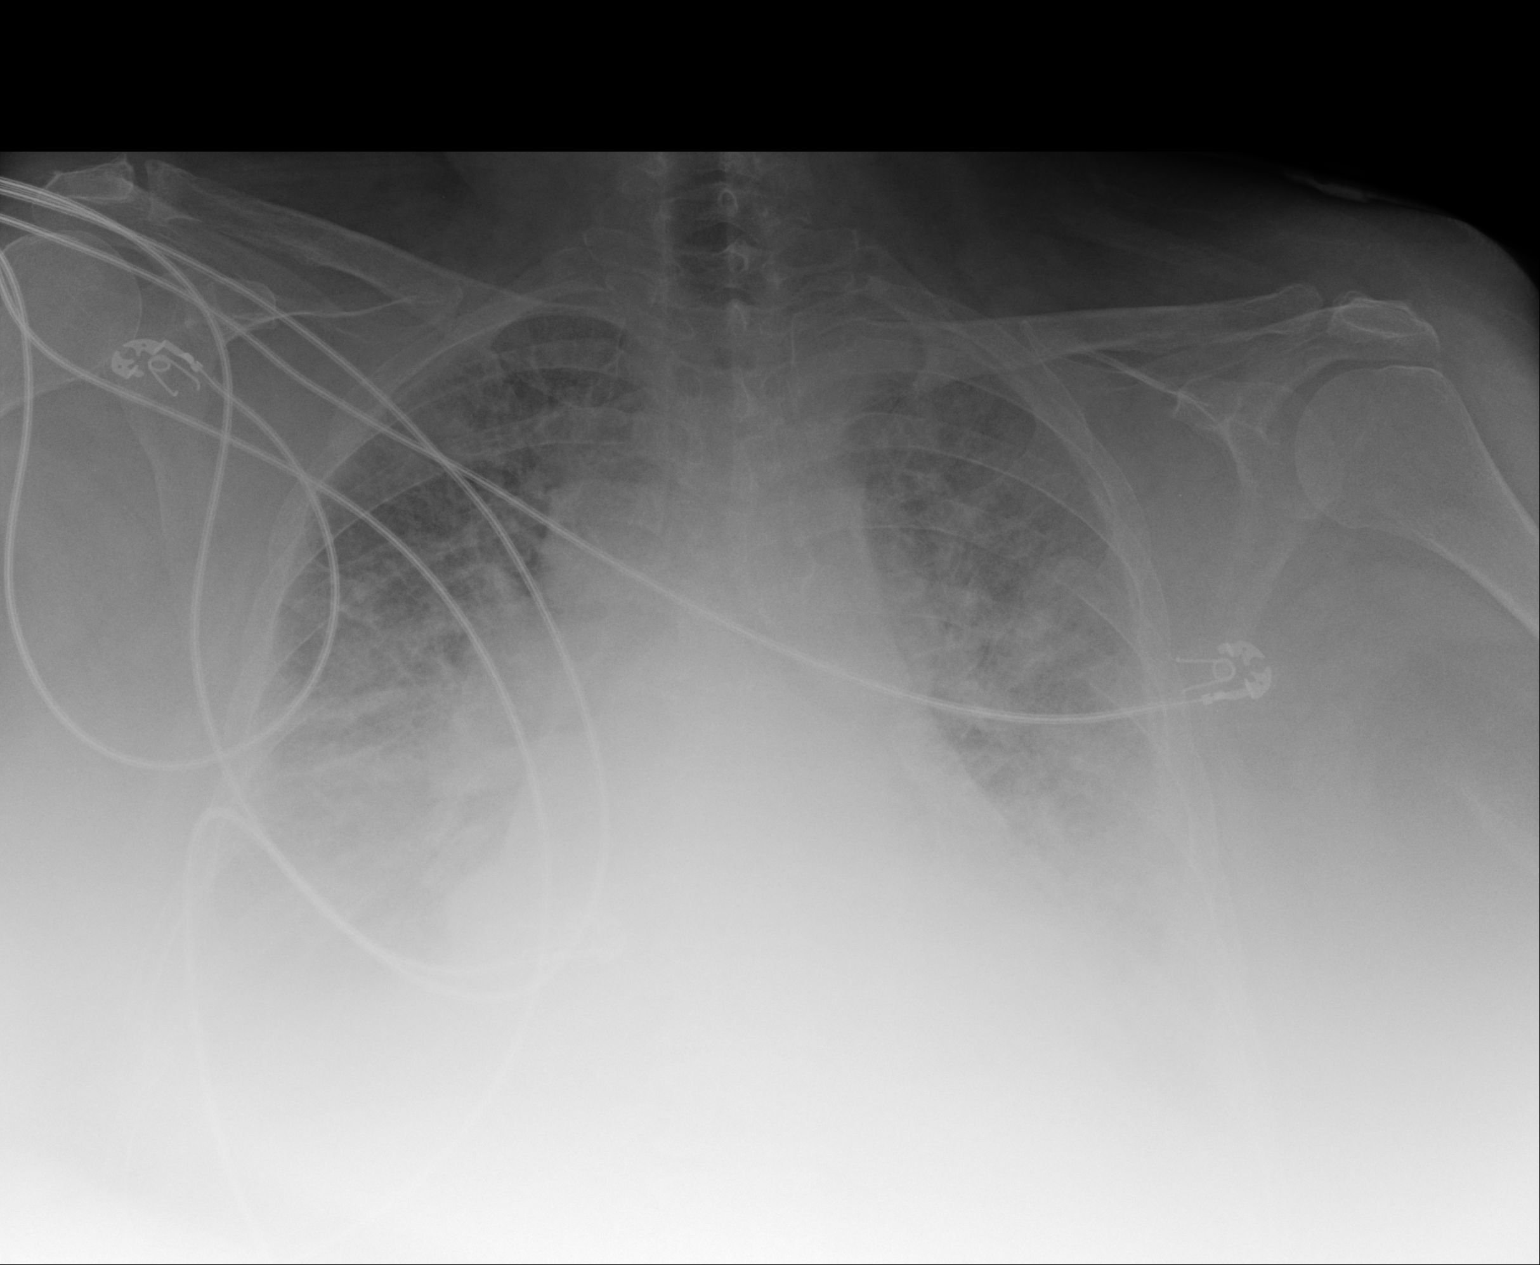

[1 of 1 positions shown; findings below may reference images not displayed]

FINDINGS: Stable cardiomegaly. Diffuse pulmonary vascular congestion. There
are bilateral interstitial and airspace opacities, most prominent
perihilar regions and there saline hazy opacities bilaterally
suggestive of bilateral posteriorly layering pleural effusions.
Overall, aeration of the lungs appears slightly worse on today's
chest radiograph compared to 01/06/2014.

Low right peritracheal soft tissue density measures up to 3 cm
transverse dimension and could reflect distended azygos/ SVC in the
setting of pulmonary venous hypertension, but lymphadenopathy or
mass cannot be excluded.
IMPRESSION: Moderate to severe congestive heart failure pattern. Bilateral
layering pleural effusions.

Soft tissue prominence in the lower right paratracheal region could
be related to pulmonary venous congestion, but lymphadenopathy or
mass cannot be completely excluded. Recommend attention on
short-term follow-up PA and lateral chest radiograph with the
patient's congestive heart failure improves.

These results will be called to the ordering clinician or
representative by the Radiologist Assistant, and communication
documented in the PACS or zVision Dashboard.
